# Patient Record
Sex: Male | Born: 1950 | Race: White | Hispanic: No | Smoking: Former smoker
Health system: Southern US, Community
[De-identification: ages and names within clinical notes are randomized; demographics above are authoritative.]

## PROBLEM LIST (undated history)

## (undated) DIAGNOSIS — K219 Gastro-esophageal reflux disease without esophagitis: Secondary | ICD-10-CM

## (undated) DIAGNOSIS — E039 Hypothyroidism, unspecified: Secondary | ICD-10-CM

## (undated) DIAGNOSIS — E079 Disorder of thyroid, unspecified: Secondary | ICD-10-CM

## (undated) DIAGNOSIS — I10 Essential (primary) hypertension: Secondary | ICD-10-CM

## (undated) DIAGNOSIS — T7840XA Allergy, unspecified, initial encounter: Secondary | ICD-10-CM

## (undated) DIAGNOSIS — E119 Type 2 diabetes mellitus without complications: Secondary | ICD-10-CM

## (undated) DIAGNOSIS — G4730 Sleep apnea, unspecified: Secondary | ICD-10-CM

## (undated) DIAGNOSIS — M109 Gout, unspecified: Secondary | ICD-10-CM

## (undated) DIAGNOSIS — H409 Unspecified glaucoma: Secondary | ICD-10-CM

## (undated) DIAGNOSIS — M1990 Unspecified osteoarthritis, unspecified site: Secondary | ICD-10-CM

## (undated) DIAGNOSIS — R112 Nausea with vomiting, unspecified: Secondary | ICD-10-CM

## (undated) DIAGNOSIS — R51 Headache: Secondary | ICD-10-CM

## (undated) DIAGNOSIS — R519 Headache, unspecified: Secondary | ICD-10-CM

## (undated) DIAGNOSIS — K5732 Diverticulitis of large intestine without perforation or abscess without bleeding: Secondary | ICD-10-CM

## (undated) DIAGNOSIS — N189 Chronic kidney disease, unspecified: Secondary | ICD-10-CM

## (undated) HISTORY — DX: Allergy, unspecified, initial encounter: T78.40XA

## (undated) HISTORY — PX: KNEE SURGERY: SHX244

## (undated) HISTORY — DX: Gout, unspecified: M10.9

## (undated) HISTORY — DX: Unspecified glaucoma: H40.9

## (undated) HISTORY — PX: ANKLE FRACTURE SURGERY: SHX122

## (undated) HISTORY — DX: Diverticulitis of large intestine without perforation or abscess without bleeding: K57.32

## (undated) HISTORY — PX: CARPAL TUNNEL RELEASE: SHX101

---

## 2001-11-09 HISTORY — PX: LEG SURGERY: SHX1003

## 2001-12-08 ENCOUNTER — Inpatient Hospital Stay (HOSPITAL_COMMUNITY): Admission: EM | Admit: 2001-12-08 | Discharge: 2001-12-16 | Payer: Self-pay | Admitting: Emergency Medicine

## 2001-12-08 ENCOUNTER — Encounter: Payer: Self-pay | Admitting: Orthopedic Surgery

## 2001-12-08 ENCOUNTER — Encounter: Payer: Self-pay | Admitting: Emergency Medicine

## 2004-12-14 ENCOUNTER — Observation Stay (HOSPITAL_COMMUNITY): Admission: EM | Admit: 2004-12-14 | Discharge: 2004-12-15 | Payer: Self-pay | Admitting: Emergency Medicine

## 2005-01-09 HISTORY — PX: JOINT REPLACEMENT: SHX530

## 2005-01-18 ENCOUNTER — Inpatient Hospital Stay (HOSPITAL_COMMUNITY): Admission: RE | Admit: 2005-01-18 | Discharge: 2005-01-21 | Payer: Self-pay | Admitting: Orthopedic Surgery

## 2008-11-09 HISTORY — PX: JOINT REPLACEMENT: SHX530

## 2010-01-05 ENCOUNTER — Inpatient Hospital Stay (HOSPITAL_COMMUNITY)
Admission: RE | Admit: 2010-01-05 | Discharge: 2010-01-08 | Payer: Self-pay | Source: Home / Self Care | Attending: Orthopaedic Surgery | Admitting: Orthopaedic Surgery

## 2010-02-22 NOTE — Discharge Summary (Signed)
[name], [name]             ACCOUNT NO.:  [id number]  MEDICAL RECORD NO.:  [id number]          PATIENT TYPE:  INP  LOCATION:  5041                         FACILITY:  [facility]  PHYSICIAN:  Stepfon Rawles C. Ophelia Charter, M.D.    DATE OF BIRTH:  January 01, 1951  DATE OF ADMISSION:  01/05/2010 DATE OF DISCHARGE:  01/08/2010                              DISCHARGE SUMMARY   ADMISSION DIAGNOSES: 1. Healed tibial fracture with retained tibial nail and right knee     osteoarthritis. 2. Hypothyroidism.  DISCHARGE DIAGNOSES: 1. Healed tibial fracture with retained tibial nail and right knee     osteoarthritis. 2. Hypothyroidism.  PROCEDURE:  On January 05, 2010, the patient underwent removal of locking tibial nail and cemented total knee arthroplasty of the right knee performed by computer assistance by Dr. Ophelia Charter under general anesthesia, assisted by Wende Neighbors, PAC.  CONSULTATIONS:  None.  BRIEF HISTORY:  The patient is a 60 year old male, who underwent IM nailing of his right tibia previously.  He has developed osteoarthritis of the right knee, which has now failed conservative treatment including intra-articular injections, physical therapy, and activity modification. It was felt he would benefit from surgical intervention and was admitted for the procedure as stated above.  BRIEF HOSPITAL COURSE:  The patient tolerated the procedure under general anesthesia without complications.  Postoperatively, neurovascular motor function of the lower extremities was noted to be intact.  The patient was placed on Coumadin for DVT prophylaxis. Adjustments in his Coumadin dose was made according to daily pro times by the pharmacist.  INR at discharge was 2.40.  The patient was started on physical therapy for ambulation and gait training, range of motion, stretching and strengthening exercises.  He was allowed weightbearing as tolerated on the operative extremity.  CPM was utilized for passive range of  motion.  Pain was controlled initially with IV analgesics, and he was weaned to p.o. analgesics without difficulty.  His wound was healing without drainage.  The patient was ambulating 200 feet prior to discharge.  He was able to void once his Foley catheter was discontinued.  He was taking a regular diet prior to discharge.  Other pertinent laboratory values; CBC on admission with hemoglobin 14.3, hematocrit 41.7.  Postoperatively, hemoglobin 11.3 and hematocrit 33.4.  Chemistry studies on admission were normal and repeat postoperatively were within normal limits.  Urinalysis on admission, negative for urinary tract infection.  The patient had a positive PCR screen for Staphylococcus aureus, negative for methicillin-resistant Staphylococcus aureus.  PLAN:  The patient was discharged to his home.  Arrangements were made for home health physical therapy through [facility].  Also anticoagulation care through [facility].  He will wear his knee immobilizer when he is ambulatory and will have full range of motion exercises.  He will change his dressing daily or as needed.  Continue on a regular diet.  Durable medical equipment made available for his home use.  He will follow up with Dr. Ophelia Charter 2 weeks from the date of surgery.  All questions were encouraged and answered.  Medication reconciliation form was provided for the patient.  He will continue on home medications  as taken prior to admission and was given prescription for oxycodone 5/325 one to two every 4-6 hours as needed for pain, Robaxin 500 mg 1 every 8 hours as needed for spasm, and Coumadin per pharmacy protocol.  CONDITION ON DISCHARGE:  Stable.     Wende Neighbors, P.A.   ______________________________ Veverly Fells Ophelia Charter, M.D.    SMV/MEDQ  D:  02/09/2010  T:  02/10/2010  Job:  [id number]  Electronically Signed by Maud Deed P.A. on 02/10/2010 12:19:25 PM Electronically Signed by Annell Greening M.D. on 02/22/2010 04:54:20 PM

## 2010-03-21 LAB — CBC
Hemoglobin: 11.3 g/dL — ABNORMAL LOW (ref 13.0–17.0)
Hemoglobin: 14.3 g/dL (ref 13.0–17.0)
MCH: 30.4 pg (ref 26.0–34.0)
MCH: 30.7 pg (ref 26.0–34.0)
MCHC: 32.8 g/dL (ref 30.0–36.0)
MCHC: 33.5 g/dL (ref 30.0–36.0)
MCHC: 34.3 g/dL (ref 30.0–36.0)
MCV: 91.6 fL (ref 78.0–100.0)
Platelets: 252 10*3/uL (ref 150–400)
Platelets: 271 10*3/uL (ref 150–400)
Platelets: 282 10*3/uL (ref 150–400)
RBC: 3.91 MIL/uL — ABNORMAL LOW (ref 4.22–5.81)
RBC: 4.58 MIL/uL (ref 4.22–5.81)
RDW: 13 % (ref 11.5–15.5)
WBC: 10.9 10*3/uL — ABNORMAL HIGH (ref 4.0–10.5)

## 2010-03-21 LAB — PROTIME-INR
INR: 0.94 (ref 0.00–1.49)
Prothrombin Time: 12.8 seconds (ref 11.6–15.2)
Prothrombin Time: 17.6 seconds — ABNORMAL HIGH (ref 11.6–15.2)

## 2010-03-21 LAB — COMPREHENSIVE METABOLIC PANEL
ALT: 29 U/L (ref 0–53)
AST: 24 U/L (ref 0–37)
Albumin: 4 g/dL (ref 3.5–5.2)
CO2: 29 mEq/L (ref 19–32)
Calcium: 10.2 mg/dL (ref 8.4–10.5)
Chloride: 104 mEq/L (ref 96–112)
GFR calc Af Amer: >60 mL/min (ref >60)
GFR calc non Af Amer: 52 mL/min — ABNORMAL LOW (ref >60)
Sodium: 139 mEq/L (ref 135–145)

## 2010-03-21 LAB — URINALYSIS, ROUTINE W REFLEX MICROSCOPIC
Bilirubin Urine: NEGATIVE
Hgb urine dipstick: NEGATIVE
Ketones, ur: NEGATIVE mg/dL
Nitrite: NEGATIVE
pH: 6.5 (ref 5.0–8.0)

## 2010-03-21 LAB — BASIC METABOLIC PANEL
CO2: 32 mEq/L (ref 19–32)
Calcium: 8.8 mg/dL (ref 8.4–10.5)
Calcium: 8.9 mg/dL (ref 8.4–10.5)
Creatinine, Ser: 1.12 mg/dL (ref 0.4–1.5)
GFR calc Af Amer: >60 mL/min (ref >60)
GFR calc non Af Amer: 56 mL/min — ABNORMAL LOW (ref >60)
Glucose, Bld: 169 mg/dL — ABNORMAL HIGH (ref 70–99)
Glucose, Bld: 177 mg/dL — ABNORMAL HIGH (ref 70–99)
Potassium: 4.3 mEq/L (ref 3.5–5.1)
Sodium: 137 mEq/L (ref 135–145)

## 2010-03-21 LAB — SURGICAL PCR SCREEN: MRSA, PCR: NEGATIVE

## 2010-05-27 NOTE — Discharge Summary (Signed)
   NAME:  [name], BELLOSO                   ACCOUNT NO.:  [id number]   MEDICAL RECORD NO.:  [id number]                   PATIENT TYPE:  INP   LOCATION:  5013                                 FACILITY:  [facility]   PHYSICIAN:  Nadara Mustard, M.D.                DATE OF BIRTH:  1950/11/20   DATE OF ADMISSION:  12/08/2001  DATE OF DISCHARGE:  12/16/2001                                 DISCHARGE SUMMARY   DIAGNOSES:  1. Right mid shaft tibia fracture.  2. Right trimalleolar ankle fracture.   PROCEDURE:  1. Open reduction internal fixation of the right ankle.  2. IM nail right tibia.   Discharged to home in stable condition with home health physical therapy and  Coumadin for DVT prophylaxis.   HISTORY OF PRESENT ILLNESS:  The patient is a 60 year old gentleman who  states he was cutting down a tree that hit a light pole, swung around and  landed on the patient.  The tree was removed by a Bobcat and the patient was  presented for surgical intervention for the above mentioned injuries.  The  patient's hospital course was essentially unremarkable.  He underwent IM  nailing of the right tibia and ORIF of the right ankle on 12/08/01.  He was  placed on IV Kefzol for infection prophylaxis and p.o. Coumadin for DVT  prophylaxis.  The patient was slow with physical therapy due to the  contusion of  his left knee.  He progressed though slowly with therapy and  was discharged to home in stable condition on 12/8 with home health physical  therapy and followup in the office in two weeks.                                                Nadara Mustard, M.D.    MVD/MEDQ  D:  01/14/2002  T:  01/14/2002  Job:  [id number]

## 2010-05-27 NOTE — Op Note (Signed)
NAME:  [name], COYE                   ACCOUNT NO.:  [id number]   MEDICAL RECORD NO.:  [id number]                   PATIENT TYPE:  INP   LOCATION:  5013                                 FACILITY:  [facility]   PHYSICIAN:  Nadara Mustard, M.D.                DATE OF BIRTH:  09-22-1950   DATE OF PROCEDURE:  12/08/2001  DATE OF DISCHARGE:                                 OPERATIVE REPORT   PREOPERATIVE DIAGNOSES:  1. Right mid shaft tibia fracture.  2. Right trimalleolar ankle fracture.   POSTOPERATIVE DIAGNOSES:  1. Right mid shaft tibia fracture.  2. Right trimalleolar ankle fracture.   OPERATION/PROCEDURE:  1. Intramedullary nailing Synthes nail, 9 x 300 mm, right tibia, locked     proximally.  2. Open reduction/internal fixation, right ankle with a plate lateral with a     lag screw as well as T screws medially.  3. Self-directed mini C-arm was utilized during the case for imaging.   SURGEON:  Nadara Mustard, M.D.   ANESTHESIA:  General.   ESTIMATED BLOOD LOSS:  Minimal, approximately 150 cc.   TOURNIQUET TIME:  None.   ANTIBIOTICS:  One gram of Kefzol.   DISPOSITION:  To PACU in stable condition.   INDICATIONS:  The patient is a 60 year old gentleman who had had a tree  strike his legs and he sustained the above-mentioned injuries.  The patient  was evaluated in the emergency room and found to be stable for surgical  intervention. The risks and benefits were discussed. The patient states he  understands and wishes to proceed at this time.   DESCRIPTION OF PROCEDURE:  The patient was brought to the OR and underwent a  general endotracheal anesthesia.  After adequate level of anesthesia was  obtained, the patient's left lower extremity was prepped using DuraPrep and  draped in a sterile field with a stockinette covering the entire right lower  extremity.   Attention was first focused on the tibia fracture.  A peripatellar incision  was made medially.  A guide  wire was placed into the tibia and this was  overdrilled.  Self-directed C-arm was used for visualization.  A guide wire  was advanced across the fracture site which again was confirmed with self-  directed C-arm and this was reamed to 11 mm for a 10 mm nail.  The nail was  chosen, 300 mm.  This was advanced across the fracture site after switching  the guide wire from a ball tip to a non ball tip guide wire and was locked  proximally x 1.  C-arm fluoroscopy was used to verify AP and lateral  radiographs of the knee and tibia to verify reduction.  The wound was  irrigated with normal saline.  The subcutaneous was closed using 2-0 Vicryl  and the skin was closed using approximated staples.   Attention was then focused on the right ankle.  A bridle incision was made  over fibula and this was carried down to the fracture site.  Subperiosteal  dissection was carried out at the fracture site.  The fracture was reduced.  A lag screw was placed and using the locking one-third tubular plate, three  locking screws were placed proximal and two locking screws distal.  The  wound was irrigated.  The subcutaneous was closed with 2-0 Vicryl and the  skin was closed with approximate staples.  C-arm fluoroscopy again verified  our reduction.   Attention was then focused over the medial malleolus.  A longitudinal  incision was made over the medial malleolus.  This was carried down to the  fascia site.  Subperiosteal dissection was carried down and clamp was used  to hold the fragment reduced.  This was again reduced with a 2 mm K-wire.  A  2.5 mm drill bit was used and a 47 cancellous screw was then used x 2 to  stabilize the medial malleolar fragment.  C-arm fluoroscopy and AP and  lateral planes verified an congruent mortise reduction.  The wound was  irrigated with normal saline.  Subcutaneous was closed with 3-0 Vicryl.  Skin was closed with using approximate staples.  The wounds were covered  with  Adaptic, orthopedic sponges, loosely wrapped Kerlix, a loosely wrapped  Coban and the patient was extubated and taken to the PACU in stable  condition.  Plan for a fracture boot in the recovery room, plan for  nonweightbearing.                                               Nadara Mustard, M.D.    MVD/MEDQ  D:  12/08/2001  T:  12/08/2001  Job:  [id number]

## 2010-05-27 NOTE — Op Note (Signed)
[name], [name] NO.:  [id number]   MEDICAL RECORD NO.:  [id number]            PATIENT TYPE:   LOCATION:                                 FACILITY:   PHYSICIAN:  Nadara Mustard, MD          DATE OF BIRTH:   DATE OF PROCEDURE:  01/18/2005  DATE OF DISCHARGE:                                 OPERATIVE REPORT   PREOP DIAGNOSIS:  Osteoarthritis left knee.   POSTOP DIAGNOSIS:  Osteoarthritis left knee.   PROCEDURE:  Left total knee arthroplasty with DePuy #4 tibia, #4 femur, 38-  mm patella, and 10-mm poly tray.   SURGEON:  Nadara Mustard, MD   ANESTHESIA:  General.   ESTIMATED BLOOD LOSS:  Minimal.   ANTIBIOTICS:  1 gram of Kefzol.   DRAINS:  None.   COMPLICATIONS:  None.   INJECTION:  50 mL of 1/4% Marcaine with epinephrine into the posterior  capsule   TOURNIQUET TIME:  65 minutes at 300 mmHg.   DISPOSITION:  To PACU in stable condition.   INDICATIONS FOR PROCEDURE:  The patient is a 54-year gentleman with severe  osteoarthritis of his left knee. The patient had a 10-degree of varus  deformity with a 10-degree flexion contracture. The patient had bone-on-bone  contact; has failed conservative care; was unable to perform activities of  daily living; and presented, at this time, for total knee arthroplasty. The  risks and benefits were discussed including infection, neurovascular injury,  persistent pain, need for additional surgery. The patient states he  understands and wishes to proceed at this time.   DESCRIPTION OF PROCEDURE:  The patient was brought to OR room #4 and  underwent general anesthetic. After an adequate level of anesthesia was  obtained; the patient's left lower extremity was prepped using DuraPrep and  draped into a sterile field.  An Collier Flowers was used to cover all exposed skin.  The knee was flexed and tourniquet inflated to 300 mmHg. A midline incision  was made. This was carried down to a medial parapatellar retinacular  incision. The patella was everted. Osteophytes were debrided from the  patella and femur. The canal starting hole was made; and the IM rod was  placed and 11-mm was taken off the distal femur. This was sized for a size  4, this was pegged for a size 4 cutting block, and the chamfer cuts for a  size 4 femur were made.   Attention was then focused on the tibia. The external alignment guide was  used for the tibia. This was set to take 0 mm off the most involved medial  joint line and essentially took 10 mm off the lateral joint line. The cut  was made, and the tibial surface was prepared.  This was set to neutral  varus and valgus and neutral posterior slope. All osteophytes were removed.  The posterior osteophytes were removed with an osteotome. The tibial tray  was then sized for a size 4, the template was placed; the keel cuts were  drilled and pegged with the temporary component placed.  The box cut was then  placed with the femur and the box cuts were made for the femur.  The #4  femoral trial was placed and this was tried with a 10, 12, and 15 inserts.  The 10 insert had full extension and had stable varus-and-valgus stress. The  10-insert was chosen. The peg cuts were drilled for the femur. The trial  implants were removed. The patella was resurfaced and 10 mm was taken off  the patella. This was set for a 38 mm and the peg cuts were made for the 38-  mm patella.   The knee was then cleansed with pulse lavage as the cement was mixed on the  back table. The posterior capsule and clod muscles were injected with a  total of 50 mL of 1/4% Marcaine with epinephrine. The tibia and then femoral  components were inserted with the patella tray in place. All loose cement  was removed; and the knee was left in extension while the cement had  hardened. The patella was also placed; and the clamp was placed in the  patella; cement was removed; and this was also left in place until the  cement had  hardened. The remainder of the pulse lavage was used; and the  knee was irrigated with pulse lavage. The tourniquet was deflated and  hemostasis was obtained. The knee was placed through a full range of motion.  The patella tracked well. The knee had full range of motion. The capsule was  then closed using a #1 Vicryl; subcu was closed using 2-0 Vicryl; the skin  was closed using approximated staples. The wound was covered with Adaptic,  orthopedic sponges, sterile Webril, and a Coban dressing. The patient was  extubated, taken to PACU in stable condition.      Nadara Mustard, MD  Electronically Signed     MVD/MEDQ  D:  01/18/2005  T:  01/18/2005  Job:  [id number]

## 2010-05-27 NOTE — H&P (Signed)
NAME:  [name], [name]                   ACCOUNT NO.:  [id number]   MEDICAL RECORD NO.:  [id number]                   PATIENT TYPE:  INP   LOCATION:  5013                                 FACILITY:  [facility]   PHYSICIAN:  Nadara Mustard, M.D.                DATE OF BIRTH:  May 31, 1950   DATE OF ADMISSION:  12/08/2001  DATE OF DISCHARGE:                                HISTORY & PHYSICAL   HISTORY OF PRESENT ILLNESS:  The patient is a 60 year old gentleman who was  chopping down trees when the tree he was cutting down struck a light pole,  twisted, and fell and landed across the patient's legs.  He states that the  people working with him had the bobcat remove the tree from his legs.  The  patient had an unstable fracture of the right leg, had abrasions on both  legs, but no open fractures.  Time of injury approximately 1 p.m.  Consultation in the emergency room approximately 5:30 p.m.   PAST MEDICAL HISTORY:  Essentially unremarkable.  He is status post a left  knee ACL reconstruction by Dr. Kathrene Bongo.   MEDICATIONS:  Cozaar 50 mg q.h.s. for hypertension.   SOCIAL HISTORY:  He smokes three packs per day.  Works for [employer].   REVIEW OF SYSTEMS:  Negative.   PHYSICAL EXAMINATION:  VITAL SIGNS:  Temperature 98.6, heart rate 76,  respiratory rate 20, blood pressure 148/74.  GENERAL:  He is in no acute distress.  LUNGS:  Clear to auscultation.  CARDIOVASCULAR:  Regular rate and rhythm.  NECK:  Supple.  No bruits.  EXTREMITIES:  Examination of the right lower extremity and left lower  extremity show both calves to be soft, nontender.  No edema or swelling.  No  evidence of compartment syndrome.  He has good dorsalis pedis pulses  bilaterally.  Feet are neurovascularly intact.  He does have multiple  abrasions over both lower extremities, but no open fractures.   Radiograph shows a mid-shaft right tibia fracture and a trimalleolar right  ankle fracture.  He does have  degenerative changes in the left ankle and  left knee, but no fractures.   ASSESSMENT:  1. Closed right mid-shaft tibial fracture.  2. Closed right trimalleolar ankle fracture.   PLAN:  The patient was scheduled for surgical intervention at this time.  The risks and benefits were discussed including infection, neurovascular  injury, arthritis, persistent pain, need for additional surgery, DVT,  pulmonary embolus.  The patient states he understands and wishes to proceed  at this time.                                              Nadara Mustard, M.D.   MVD/MEDQ  D:  12/08/2001  T:  12/08/2001  Job:  [id number]

## 2010-05-27 NOTE — Discharge Summary (Signed)
[name], [name]             ACCOUNT NO.:  [id number]   MEDICAL RECORD NO.:  [id number]          PATIENT TYPE:  INP   LOCATION:  5032                         FACILITY:  [facility]   PHYSICIAN:  Nadara Mustard, MD     DATE OF BIRTH:  05-Nov-1950   DATE OF ADMISSION:  01/18/2005  DATE OF DISCHARGE:  01/21/2005                                 DISCHARGE SUMMARY   DIAGNOSIS:  Osteoarthritis, left knee.   PROCEDURE:  Left total knee arthroplasty.   DISPOSITION:  Discharged to home in stable condition with home health  physical therapy and durable medical equipment with plan to follow up in the  office in two weeks.   HISTORY OF PRESENT ILLNESS:  The patient is a 60 year old gentleman with  osteoarthritis of the left knee.  The patient failed conservative care and  presents at this time for total knee arthroplasty.  The patient underwent  total knee arthroplasty with DePuy components on January 18, 2005, with the  #4 tibia, #4 femur, 38 mm patella and 10 mm poly tray.  The patient received  Kefzol for infection prophylaxis and Coumadin for DVT prophylaxis.  The  patient progressed well with physical therapy.  His hemoglobin was stable.  He was discharged to home in stable condition on January 21, 2005, with home  health physical therapy and follow up in the office in two weeks.      Nadara Mustard, MD  Electronically Signed     MVD/MEDQ  D:  03/16/2005  T:  03/16/2005  Job:  [id number]

## 2010-09-15 ENCOUNTER — Inpatient Hospital Stay (HOSPITAL_COMMUNITY)
Admission: EM | Admit: 2010-09-15 | Discharge: 2010-09-20 | DRG: 392 | Disposition: A | Discharge status: Home / Self Care | Payer: 59 | Attending: General Surgery | Admitting: General Surgery

## 2010-09-15 ENCOUNTER — Emergency Department (INDEPENDENT_AMBULATORY_CARE_PROVIDER_SITE_OTHER): Payer: 59

## 2010-09-15 ENCOUNTER — Emergency Department (HOSPITAL_BASED_OUTPATIENT_CLINIC_OR_DEPARTMENT_OTHER)
Admission: EM | Admit: 2010-09-15 | Discharge: 2010-09-15 | Disposition: A | Discharge status: Other Acute Inpatient Hospital | Payer: 59 | Source: Home / Self Care | Attending: Emergency Medicine | Admitting: Emergency Medicine

## 2010-09-15 DIAGNOSIS — K219 Gastro-esophageal reflux disease without esophagitis: Secondary | ICD-10-CM | POA: Insufficient documentation

## 2010-09-15 DIAGNOSIS — K8010 Calculus of gallbladder with chronic cholecystitis without obstruction: Secondary | ICD-10-CM

## 2010-09-15 DIAGNOSIS — M546 Pain in thoracic spine: Secondary | ICD-10-CM

## 2010-09-15 DIAGNOSIS — E039 Hypothyroidism, unspecified: Secondary | ICD-10-CM | POA: Diagnosis present

## 2010-09-15 DIAGNOSIS — K5732 Diverticulitis of large intestine without perforation or abscess without bleeding: Secondary | ICD-10-CM

## 2010-09-15 DIAGNOSIS — I10 Essential (primary) hypertension: Secondary | ICD-10-CM | POA: Insufficient documentation

## 2010-09-15 DIAGNOSIS — E079 Disorder of thyroid, unspecified: Secondary | ICD-10-CM | POA: Insufficient documentation

## 2010-09-15 DIAGNOSIS — Z87891 Personal history of nicotine dependence: Secondary | ICD-10-CM

## 2010-09-15 DIAGNOSIS — K7689 Other specified diseases of liver: Secondary | ICD-10-CM

## 2010-09-15 DIAGNOSIS — K5900 Constipation, unspecified: Secondary | ICD-10-CM

## 2010-09-15 HISTORY — DX: Essential (primary) hypertension: I10

## 2010-09-15 HISTORY — DX: Gastro-esophageal reflux disease without esophagitis: K21.9

## 2010-09-15 HISTORY — DX: Disorder of thyroid, unspecified: E07.9

## 2010-09-15 LAB — COMPREHENSIVE METABOLIC PANEL
Albumin: 3.4 g/dL — ABNORMAL LOW (ref 3.5–5.2)
BUN: 16 mg/dL (ref 6–23)
CO2: 27 mEq/L (ref 19–32)
Chloride: 95 mEq/L — ABNORMAL LOW (ref 96–112)
Creatinine, Ser: 1.3 mg/dL (ref 0.50–1.35)
GFR calc Af Amer: >60 mL/min (ref >60)
GFR calc non Af Amer: 57 mL/min — ABNORMAL LOW (ref >60)
Glucose, Bld: 105 mg/dL — ABNORMAL HIGH (ref 70–99)
Total Bilirubin: 0.4 mg/dL (ref 0.3–1.2)

## 2010-09-15 LAB — URINALYSIS, ROUTINE W REFLEX MICROSCOPIC
Leukocytes, UA: NEGATIVE
Nitrite: NEGATIVE
Protein, ur: 30 mg/dL — AB
Urobilinogen, UA: 1 mg/dL (ref 0.0–1.0)

## 2010-09-15 LAB — URINE MICROSCOPIC-ADD ON

## 2010-09-15 LAB — CBC
HCT: 39.7 % (ref 39.0–52.0)
MCV: 86.9 fL (ref 78.0–100.0)
RDW: 12.6 % (ref 11.5–15.5)
WBC: 12.8 10*3/uL — ABNORMAL HIGH (ref 4.0–10.5)

## 2010-09-15 LAB — LIPASE, BLOOD: Lipase: 18 U/L (ref 11–59)

## 2010-09-15 MED ORDER — ONDANSETRON HCL 4 MG/2ML IJ SOLN
INTRAMUSCULAR | Status: AC
  Filled 2010-09-15: qty 2

## 2010-09-15 MED ORDER — HYDROMORPHONE HCL 1 MG/ML IJ SOLN
1.0000 mg | Freq: Once | INTRAMUSCULAR | Status: AC
  Administered 2010-09-15: 1 mg via INTRAVENOUS

## 2010-09-15 MED ORDER — HYDROMORPHONE HCL 1 MG/ML IJ SOLN
INTRAMUSCULAR | Status: AC
  Filled 2010-09-15: qty 1

## 2010-09-15 MED ORDER — ONDANSETRON HCL 4 MG/2ML IJ SOLN
4.0000 mg | Freq: Once | INTRAMUSCULAR | Status: AC
  Administered 2010-09-15: 4 mg via INTRAVENOUS

## 2010-09-15 MED ORDER — SODIUM CHLORIDE 0.9 % IV BOLUS (SEPSIS)
1000.0000 mL | Freq: Once | INTRAVENOUS | Status: DC
  Administered 2010-09-15: 1000 mL via INTRAVENOUS

## 2010-09-15 MED ORDER — CIPROFLOXACIN IN D5W 400 MG/200ML IV SOLN
400.0000 mg | Freq: Once | INTRAVENOUS | Status: DC
  Administered 2010-09-15: 400 mg via INTRAVENOUS
  Filled 2010-09-15: qty 200

## 2010-09-15 MED ORDER — IOHEXOL 300 MG/ML  SOLN
100.0000 mL | Freq: Once | INTRAMUSCULAR | Status: AC | PRN
  Administered 2010-09-15: 100 mL via INTRAVENOUS

## 2010-09-15 MED ORDER — METRONIDAZOLE IN NACL 5-0.79 MG/ML-% IV SOLN
500.0000 mg | Freq: Once | INTRAVENOUS | Status: DC
  Filled 2010-09-15: qty 100

## 2010-09-15 NOTE — ED Provider Notes (Signed)
History     CSN: [id number] Arrival date & time: 09/15/2010  6:00 PM  Chief Complaint  Patient presents with  . Abdominal Pain   Patient is a 60 y.o. male presenting with abdominal pain.  Abdominal Pain The primary symptoms of the illness include abdominal pain. The primary symptoms of the illness do not include dysuria. The current episode started more than 2 days ago. The onset of the illness was gradual. The problem has been gradually worsening.  The illness is associated with recent antibiotic use. Additional symptoms associated with the illness include hematuria. Symptoms associated with the illness do not include chills, anorexia, diaphoresis, heartburn, constipation, urgency, frequency or back pain. Significant associated medical issues include GERD. Significant associated medical issues do not include PUD, inflammatory bowel disease, diabetes, sickle cell disease, gallstones, liver disease, substance abuse, diverticulitis, HIV or cardiac disease.    Past Medical History  Diagnosis Date  . Hypertension   . GERD (gastroesophageal reflux disease)   . Thyroid disease     Past Surgical History  Procedure Date  . Knee surgery   . Leg surgery     No family history on file.  History  Substance Use Topics  . Smoking status: Former Games developer  . Smokeless tobacco: Not on file  . Alcohol Use: Yes      Review of Systems  Constitutional: Negative for chills and diaphoresis.  HENT: Negative for facial swelling.   Eyes: Negative for discharge.  Respiratory: Negative for apnea.   Cardiovascular: Negative for chest pain.  Gastrointestinal: Positive for abdominal pain. Negative for heartburn, constipation, abdominal distention and anorexia.  Genitourinary: Positive for hematuria. Negative for dysuria, urgency, frequency, flank pain, discharge, difficulty urinating and genital sores.  Musculoskeletal: Negative for back pain.  Skin: Negative.   Neurological: Negative.   Hematological:  Negative.   Psychiatric/Behavioral: Negative.     Physical Exam  BP 110/74  Pulse 83  Temp(Src) 99.4 F (37.4 C) (Oral)  Resp 20  Ht 6' (1.829 m)  Wt 216 lb (97.977 kg)  BMI 29.29 kg/m2  SpO2 98%  Physical Exam  Constitutional: He is oriented to person, place, and time. He appears well-developed.  HENT:  Head: Normocephalic and atraumatic.  Eyes: EOM are normal. Pupils are equal, round, and reactive to light.  Neck: Normal range of motion. Neck supple.  Cardiovascular: Normal rate and regular rhythm.   Pulmonary/Chest: Effort normal and breath sounds normal.  Abdominal: Soft. Bowel sounds are normal. He exhibits no distension and no mass. There is tenderness.  Musculoskeletal: Normal range of motion.  Neurological: He is alert and oriented to person, place, and time.  Skin: Skin is warm and dry.  Psychiatric: He has a normal mood and affect.    ED Course  Procedures     Case d/w Dr. Silvano Rusk who will see in the ED  Kieley Akter K Colonel Krauser-Rasch, MD 09/15/10 [id number]

## 2010-09-15 NOTE — ED Notes (Signed)
abd pain x 2 days-ws seen by PCP-dx with UTI given rx-states is no better-pain abd radiates to back

## 2010-09-16 LAB — BASIC METABOLIC PANEL
BUN: 16 mg/dL (ref 6–23)
CO2: 25 mEq/L (ref 19–32)
Glucose, Bld: 180 mg/dL — ABNORMAL HIGH (ref 70–99)
Potassium: 4.3 mEq/L (ref 3.5–5.1)
Sodium: 130 mEq/L — ABNORMAL LOW (ref 135–145)

## 2010-09-16 LAB — CBC
HCT: 36 % — ABNORMAL LOW (ref 39.0–52.0)
MCH: 30.2 pg (ref 26.0–34.0)
MCV: 88.5 fL (ref 78.0–100.0)
RDW: 13 % (ref 11.5–15.5)
WBC: 8.3 10*3/uL (ref 4.0–10.5)

## 2010-09-16 LAB — URINE CULTURE

## 2010-09-20 LAB — COMPREHENSIVE METABOLIC PANEL
BUN: 12 mg/dL (ref 6–23)
CO2: 27 mEq/L (ref 19–32)
Calcium: 9.7 mg/dL (ref 8.4–10.5)
Creatinine, Ser: 1.13 mg/dL (ref 0.50–1.35)
GFR calc Af Amer: >60 mL/min (ref >60)
GFR calc non Af Amer: >60 mL/min (ref >60)
Glucose, Bld: 114 mg/dL — ABNORMAL HIGH (ref 70–99)

## 2010-09-20 LAB — CBC
Hemoglobin: 12.6 g/dL — ABNORMAL LOW (ref 13.0–17.0)
MCH: 30.1 pg (ref 26.0–34.0)
MCHC: 34.3 g/dL (ref 30.0–36.0)
MCV: 87.6 fL (ref 78.0–100.0)
RBC: 4.19 MIL/uL — ABNORMAL LOW (ref 4.22–5.81)

## 2010-09-20 NOTE — H&P (Signed)
[name], [name] NO.:  [id number]  MEDICAL RECORD NO.:  [id number]  LOCATION:                                 FACILITY:  PHYSICIAN:  Velora Heckler, MD      DATE OF BIRTH:  01-02-1951  DATE OF ADMISSION: DATE OF DISCHARGE:                             HISTORY & PHYSICAL   REFERRING PHYSICIAN:  April Palumbo-Rasch, MD, [facility] Department.  REASON FOR ADMISSION:  Acute diverticulitis with microperforation.  BRIEF HISTORY:  The patient is a 60 year old white male from [place], [place].  He presented to [facility] on September 6th with 4-day history of lower abdominal pain and fever.  The patient had been evaluated 2 days previous by his primary care physician.  He was diagnosed at that time with suspected urinary tract infection and placed on oral antibiotics.  The patient continued to have lower abdominal pain associated with anorexia.  He presented to [facility] for reevaluation.  The patient was noted to have an elevated white blood cell count of 12.8.  Physical exam was notable for left lower quadrant abdominal tenderness.  CT scan of abdomen and pelvis showed evidence of acute diverticulitis with microperforation.  General Surgery was contacted and the patient was transported from [facility] to [facility] Emergency Department for general surgery evaluation and admission.  PAST MEDICAL HISTORY: 1. Hypertension. 2. Gastroesophageal reflux disease. 3. Hypothyroidism. 4. Bilateral knee surgery.  MEDICATIONS:  Losartan, Protonix, and levothyroxine  ALLERGIES:  No known drug allergies.  SOCIAL HISTORY:  The patient is accompanied by his 2 daughters.  He is employed as a Curator for [employer].  He quit smoking 1 year ago.  He drinks alcohol on occasion.  FAMILY HISTORY:  Noncontributory.  REVIEW OF SYSTEMS:  15-system review without significant other findings except as noted  above.  PHYSICAL EXAMINATION:  GENERAL:  A 60 year old, well-developed, well- nourished white male on a stretcher in the emergency department, no significant discomfort. VITAL SIGNS:  Temperature 98.2, pulse 73, respirations 18, blood pressure 105/65. HEENT:  Normocephalic, atraumatic.  Sclerae clear.  Conjunctivae clear. Pupils equal and reactive.  Dentition fair.  Mucous membranes moist. Voice normal. NECK:  Palpation of the neck shows no thyroid nodularity.  No lymphadenopathy.  No mass.  No tenderness.  LUNGS:  Clear to auscultation bilaterally without rales, rhonchi, or wheeze. CARDIAC:  Regular rate and rhythm without significant murmur. Peripheral pulses are full. EXTREMITIES:  Nontender without edema. NEUROLOGIC:  The patient is patient is alert and oriented without focal neurologic deficit. ABDOMEN:  Shows mild distention.  On auscultation, there are no bowel sounds present.  There is mild-to-moderate tenderness to palpation and percussion in the suprapubic and left lower quadrants.  There are no surgical wounds.  There is no obvious sign of hernia.  LABORATORY STUDIES:  White count 12.8, hemoglobin 14.0, platelet count 313,000.  Electrolytes show potassium 3.8, creatinine 1.30, calcium was elevated at 11.0.  Liver function tests are normal.  RADIOGRAPHIC STUDIES:  CT scan of the abdomen and pelvis shows evidence of acute diverticulitis.  There are a few small loculated collections of air suspicious for microperforation.  There is no significant fluid. There is no gross free air present within the peritoneal cavity.  IMPRESSION:  Perforated sigmoid diverticulitis.  PLAN: 1. The patient will be admitted on the general surgery service at     [facility]. 2. The patient has been started on intravenous Cipro and Flagyl by the     emergency department.  This will be continued. 3. The patient will be maintained n.p.o. and given intravenous     hydration.   He will be allowed ice chips for comfort. 4. Repeat laboratories on September 7th to include CBC, basic     metabolic profile, and prothrombin time. 5. I discussed with the patient and his daughters the significance of     acute diverticulitis with microperforation.  We discussed urgent     surgical intervention with sigmoid colectomy and descending     colostomy as a possibility.  Certainly, if the patient fails bowel     rest and intravenous antibiotics, he will require urgent     laparotomy.  Otherwise, the patient may be a candidate for delayed     sigmoid colectomy, which can be performed as a single-stage     procedure.  I told him there is approximately a 50% chance of     success with medical management at this point.  He understands and     wishes to proceed.     Velora Heckler, MD     TMG/MEDQ  D:  09/16/2010  T:  09/16/2010  Job:  [id number]  cc:   Joycelyn Rua, M.D. Fax: [phone]  Electronically Signed by Darnell Level MD on 09/20/2010 09:03:35 AM

## 2010-09-29 NOTE — Discharge Summary (Signed)
  NAMERONELL, DUFFUS NO.:  [id number]  MEDICAL RECORD NO.:  [id number]  LOCATION:  1304                         FACILITY:  [facility]  PHYSICIAN:  Maisie Fus A. Adolphus Hanf, M.D.DATE OF BIRTH:  [birth year]  DATE OF ADMISSION:  09/15/2010 DATE OF DISCHARGE:  09/20/2010                              DISCHARGE SUMMARY   ADMISSION DIAGNOSES: 1. Acute diverticulitis with microperforation. 2. Hypertension. 3. Gastroesophageal reflux disease. 4. Hypothyroidism. 5. Bilateral knee surgery.  DISCHARGE DIAGNOSES: 1. Acute diverticulitis with microperforation. 2. Hypertension. 3. Gastroesophageal reflux disease. 4. Hypothyroidism. 5. Bilateral knee surgery.  PROCEDURES:  None.  BRIEF HISTORY:  The patient is a 60 year old white male from [facility], who [facility] with 4-day history of lower abdominal pain and fever.  He had been evaluated 2 days prior by his primary care.  His diagnosis at that time was suspected urinary tract infection, was placed on oral antibiotics.  He continued to have abdominal pain associated with anorexia.  He was seen in the ER.  His white count was 12,800.  Exam was notable for left lower quadrant abdominal tenderness.  CT showed evidence of acute diverticulitis with perforation.  He was subsequently transported to [facility] and admitted by Dr. Darnell Level for the General Surgery Service.  MEDICATIONS ON ADMISSION:  Losartan, Protonix, and levothyroxine.  ALLERGIES:  None known.  For further history and physical, please see the dictated note.  HOSPITAL COURSE:  The patient was admitted.  He was started on IV Flagyl and Cipro.  This was continued along with IV hydration.  On September 16, 2010, he noted improvement in his symptoms.  White count was down to 8.3.  He continued to make slow steady progress.  On September 19, 2010, he had a bowel movement.  His diet was increased to a soft.  He was restarted  on his p.o. medications from home, and by September 20, 2010, he was feeling much better.  Discomfort was resolved.  His white count is still slightly elevated at 11,600, hemoglobin is 12.6, hematocrit is 36.7, platelets are 358,000.  Electrolytes are normal.  BUN is 12, creatinine is 1.13.  LFTs were normal.  At that point, it was Dr. Rosezena Sensor opinion is that he could go home on 2 weeks of antibiotics. He will follow up with Dr. Gerrit Friends in 2 weeks.  He is to call for an appointment, and he is to call [facility] for followup in their office also.  CONDITION ON DISCHARGE:  Improving.     Eber Hong, P.A.   ______________________________ Clovis Pu Dannelle Rhymes, M.D.    WDJ/MEDQ  D:  09/20/2010  T:  09/20/2010  Job:  [id number]  cc:   [facility]  Electronically Signed by Sherrie George P.A. on 09/26/2010 04:12:44 PM Electronically Signed by Harriette Bouillon M.D. on 09/29/2010 02:28:33 AM

## 2010-10-20 ENCOUNTER — Ambulatory Visit (INDEPENDENT_AMBULATORY_CARE_PROVIDER_SITE_OTHER): Payer: 59 | Admitting: Surgery

## 2010-10-20 ENCOUNTER — Encounter (INDEPENDENT_AMBULATORY_CARE_PROVIDER_SITE_OTHER): Payer: Self-pay | Admitting: Surgery

## 2010-10-20 VITALS — BP 102/70 | HR 88 | Temp 97.6°F | Resp 20 | Ht 72.0 in | Wt 213.1 lb

## 2010-10-20 DIAGNOSIS — K5720 Diverticulitis of large intestine with perforation and abscess without bleeding: Secondary | ICD-10-CM | POA: Insufficient documentation

## 2010-10-20 DIAGNOSIS — K5732 Diverticulitis of large intestine without perforation or abscess without bleeding: Secondary | ICD-10-CM

## 2010-10-20 NOTE — Progress Notes (Signed)
Visit Diagnoses: 1. Diverticulitis of colon with perforation     HISTORY: Patient is a 60 year old male who is admitted in September 2012 to the general surgery service with acute diverticulitis with microperforation. Patient was treated with bowel rest and antibiotic therapy with improvement. He did not require laparotomy. He was discharged home on 2 week course of oral antibiotics. He has now been off of his antibiotic therapy for over 2 weeks and is doing well.  Patient has no further abdominal pain. He is watching his diet closely. He returns today for scheduled followup.   PERTINENT REVIEW OF SYSTEMS: Regular bowel movements. Patient denies bleeding per rectum. He denies abdominal pain. He denies fever.   EXAM: HEENT: normocephalic; pupils equal and reactive; sclerae clear; dentition good; mucous membranes moist NECK:  No nodules; symmetric on extension; no palpable anterior or posterior cervical lymphadenopathy; no supraclavicular masses; no tenderness CHEST: clear to auscultation bilaterally without rales, rhonchi, or wheezes CARDIAC: regular rate and rhythm without significant murmur; peripheral pulses are full ABDOMEN: Abdomen is soft, nontender, without distention. There is a small reducible umbilical hernia which is asymptomatic. No palpable masses. No hepatosplenomegaly. EXT:  non-tender without edema; no deformity NEURO: no gross focal deficits; no sign of tremor   IMPRESSION: #1 recent acute diverticulitis with microperforation, resolved on antibiotic therapy #2 asymptomatic umbilical hernia #3 need for screening colonoscopy   PLAN: Patient will undergo a barium enema study to define the extent and location of his diverticular disease. At the patient's request, we will delay this study until January 2013. He will return to see me when that study is completed to review the results and to discuss whether surgical intervention is necessary.  Once this study is completed, I  will ask his primary physician to arrange for screening colonoscopy.   Velora Heckler, MD, FACS General & Endocrine Surgery [facility]  Primary:  Joycelyn Rua, MD, [facility]

## 2010-10-20 NOTE — Patient Instructions (Signed)
Barium enema in January 2013 per patient request.  TMG

## 2010-11-07 ENCOUNTER — Other Ambulatory Visit (HOSPITAL_COMMUNITY): Payer: 59

## 2011-01-25 ENCOUNTER — Ambulatory Visit (HOSPITAL_COMMUNITY)
Admission: RE | Admit: 2011-01-25 | Discharge: 2011-01-25 | Disposition: A | Discharge status: Home / Self Care | Payer: 59 | Source: Ambulatory Visit | Attending: Surgery | Admitting: Surgery

## 2011-01-25 DIAGNOSIS — K5720 Diverticulitis of large intestine with perforation and abscess without bleeding: Secondary | ICD-10-CM

## 2011-01-25 DIAGNOSIS — K5730 Diverticulosis of large intestine without perforation or abscess without bleeding: Secondary | ICD-10-CM | POA: Insufficient documentation

## 2011-01-25 MED ORDER — IOHEXOL 300 MG/ML  SOLN: 450.0000 mL | Freq: Once | INTRAMUSCULAR | Status: AC | PRN

## 2011-02-02 ENCOUNTER — Encounter (INDEPENDENT_AMBULATORY_CARE_PROVIDER_SITE_OTHER): Payer: Self-pay | Admitting: Surgery

## 2011-02-02 ENCOUNTER — Ambulatory Visit (INDEPENDENT_AMBULATORY_CARE_PROVIDER_SITE_OTHER): Payer: 59 | Admitting: Surgery

## 2011-02-02 VITALS — BP 122/68 | HR 66 | Temp 97.8°F | Resp 16 | Ht 72.0 in | Wt 216.0 lb

## 2011-02-02 DIAGNOSIS — K5732 Diverticulitis of large intestine without perforation or abscess without bleeding: Secondary | ICD-10-CM

## 2011-02-02 DIAGNOSIS — K5720 Diverticulitis of large intestine with perforation and abscess without bleeding: Secondary | ICD-10-CM

## 2011-02-02 NOTE — Progress Notes (Signed)
Visit Diagnoses: 1. Diverticulitis of colon with perforation     HISTORY: Patient is a 61 year old white male who was hospitalized with acute diverticulitis with microperforation. He was managed with bowel rest and intravenous antibiotics and resolved without surgical intervention. At my request he underwent a barium enema study. This demonstrates a short segment of diverticulosis measuring 6 cm in length in the sigmoid colon with mild narrowing consistent with a stricture or muscle spasm.  No other pathologic abnormalities are identified. No sign of leakage.  PERTINENT REVIEW OF SYSTEMS: Patient is taking a high fiber diet. He is having 1-2 soft bowel movements daily. He denies abdominal pain. He denies any bleeding per rectum.  EXAM: HEENT: normocephalic; pupils equal and reactive; sclerae clear; dentition good; mucous membranes moist ABDOMEN: Soft without distension; no tenderness; no mass; small umbilical hernia, reducible  IMPRESSION: Personal history of acute diverticulitis with microperforation, resolved  PLAN: The patient and I reviewed the barium enema study results. At this point I do not recommend surgical intervention. Patient will remain on his diet. I will ask his primary care physician to arrange for screening colonoscopy at some point in the next year.  Patient I discussed warning signs of recurrent diverticulitis. He will return to see me should he develop any further problems related to his diverticular disease.  Velora Heckler, MD, FACS General & Endocrine Surgery [facility]

## 2011-02-02 NOTE — Patient Instructions (Signed)
Diverticulitis A diverticulum is a small pouch or sac on the colon. Diverticulosis is the presence of these diverticula on the colon. Diverticulitis is the irritation (inflammation) or infection of diverticula. CAUSES  The colon and its diverticula contain bacteria. If food particles block the tiny opening to a diverticulum, the bacteria inside can grow and cause an increase in pressure. This leads to infection and inflammation and is called diverticulitis. SYMPTOMS   Abdominal pain and tenderness. Usually, the pain is located on the left side of your abdomen. However, it could be located elsewhere.   Fever.   Bloating.   Feeling sick to your stomach (nausea).   Throwing up (vomiting).   Abnormal stools.  DIAGNOSIS  Your caregiver will take a history and perform a physical exam. Since many things can cause abdominal pain, other tests may be necessary. Tests may include:  Blood tests.   Urine tests.   X-ray of the abdomen.   CT scan of the abdomen.  Sometimes, surgery is needed to determine if diverticulitis or other conditions are causing your symptoms. TREATMENT  Most of the time, you can be treated without surgery. Treatment includes:  Resting the bowels by only having liquids for a few days. As you improve, you will need to eat a low-fiber diet.   Intravenous (IV) fluids if you are losing body fluids (dehydrated).   Antibiotic medicines that treat infections may be given.   Pain and nausea medicine, if needed.   Surgery if the inflamed diverticulum has burst.  HOME CARE INSTRUCTIONS   Try a clear liquid diet (broth, tea, or water for as long as directed by your caregiver). You may then gradually begin a low-fiber diet as tolerated. A low-fiber diet is a diet with less than 10 grams of fiber. Choose the foods below to reduce fiber in the diet:   White breads, cereals, rice, and pasta.   Cooked fruits and vegetables or soft fresh fruits and vegetables without the skin.     Ground or well-cooked tender beef, ham, veal, lamb, pork, or poultry.   Eggs and seafood.   After your diverticulitis symptoms have improved, your caregiver may put you on a high-fiber diet. A high-fiber diet includes 14 grams of fiber for every 1000 calories consumed. For a standard 2000 calorie diet, you would need 28 grams of fiber. Follow these diet guidelines to help you increase the fiber in your diet. It is important to slowly increase the amount fiber in your diet to avoid gas, constipation, and bloating.   Choose whole-grain breads, cereals, pasta, and brown rice.   Choose fresh fruits and vegetables with the skin on. Do not overcook vegetables because the more vegetables are cooked, the more fiber is lost.   Choose more nuts, seeds, legumes, dried peas, beans, and lentils.   Look for food products that have greater than 3 grams of fiber per serving on the Nutrition Facts label.   Take all medicine as directed by your caregiver.   If your caregiver has given you a follow-up appointment, it is very important that you go. Not going could result in lasting (chronic) or permanent injury, pain, and disability. If there is any problem keeping the appointment, call to reschedule.  SEEK MEDICAL CARE IF:   Your pain does not improve.   You have a hard time advancing your diet beyond clear liquids.   Your bowel movements do not return to normal.  SEEK IMMEDIATE MEDICAL CARE IF:   Your pain becomes   worse.   You have an oral temperature above 102 F (38.9 C), not controlled by medicine.   You have repeated vomiting.   You have bloody or black, tarry stools.   Symptoms that brought you to your caregiver become worse or are not getting better.  MAKE SURE YOU:   Understand these instructions.   Will watch your condition.   Will get help right away if you are not doing well or get worse.  Document Released: 10/05/2004 Document Revised: 09/07/2010 Document Reviewed:  01/31/2010 Exit[employer] Patient Information 2012 [employer]. 

## 2011-09-19 ENCOUNTER — Encounter (INDEPENDENT_AMBULATORY_CARE_PROVIDER_SITE_OTHER): Payer: Self-pay

## 2015-12-01 ENCOUNTER — Ambulatory Visit (INDEPENDENT_AMBULATORY_CARE_PROVIDER_SITE_OTHER): Payer: 59

## 2015-12-01 ENCOUNTER — Ambulatory Visit (INDEPENDENT_AMBULATORY_CARE_PROVIDER_SITE_OTHER): Payer: 59 | Admitting: Orthopaedic Surgery

## 2015-12-01 ENCOUNTER — Encounter (INDEPENDENT_AMBULATORY_CARE_PROVIDER_SITE_OTHER): Payer: Self-pay | Admitting: Orthopaedic Surgery

## 2015-12-01 ENCOUNTER — Encounter (INDEPENDENT_AMBULATORY_CARE_PROVIDER_SITE_OTHER): Payer: Self-pay

## 2015-12-01 VITALS — BP 124/75 | HR 71 | Ht 72.0 in | Wt 198.0 lb

## 2015-12-01 DIAGNOSIS — M542 Cervicalgia: Secondary | ICD-10-CM

## 2015-12-01 DIAGNOSIS — M4722 Other spondylosis with radiculopathy, cervical region: Secondary | ICD-10-CM | POA: Insufficient documentation

## 2015-12-01 NOTE — Progress Notes (Signed)
Office Visit Note   Patient: [name]           Date of Birth: 1950/04/15           MRN: [id number] Visit Date: 12/01/2015              Requested by: Corine Shelter, PA-C [address] [place], [place] [address] PCP: Corine Shelter, PA-C   Assessment & Plan: Visit Diagnoses:  1. Neck pain   2. Osteoarthritis of spine with radiculopathy, cervical region     Plan: Patient will proceed with cervical MRI to evaluate him for his left C7 radiculopathy. He also has some C8 weakness.  Follow-Up Instructions: Return in about 2 weeks (around 12/15/2015).   Orders:  Orders Placed This Encounter  Procedures  . XR Cervical Spine 2 or 3 views  . MR Cervical Spine w/o contrast   No orders of the defined types were placed in this encounter.     Procedures: No procedures performed   Clinical Data: No additional findings.   Subjective: Chief Complaint  Patient presents with  . Left Hand - Numbness    Patient here for numbness in left hand. This has been ongoing x 2 months. He states that the 4th and 5th digits are numb and that it radiates up into his wrist. He does have left neck pain. He had carpal tunnel release on left and right in 2016.  He states that he had xrays made that showed disc changes in his neck.  He is not taking anything for discomfort.  Patient got good relief of his carpal tunnel symptoms first 2016 surgery. Last several months had progressive weakness in his arm some pain in his neck loss of grip strength weakness with pushing on the left none on the right no problems with the walking no leg weakness  Review of Systems  Constitutional: Negative for chills and diaphoresis.  HENT: Negative for ear discharge, ear pain and nosebleeds.   Eyes: Negative for discharge and visual disturbance.  Respiratory: Negative for cough, choking and shortness of breath.   Cardiovascular: Negative for chest pain and palpitations.  Gastrointestinal: Negative for  abdominal distention and abdominal pain.  Endocrine: Negative for cold intolerance and heat intolerance.  Genitourinary: Negative for flank pain and hematuria.  Skin: Negative for rash and wound.  Neurological: Negative for seizures and speech difficulty.  Hematological: Negative for adenopathy. Does not bruise/bleed easily.  Psychiatric/Behavioral: Negative for agitation and suicidal ideas.     Objective: Vital Signs: BP 124/75   Pulse 71   Ht 6' (1.829 m)   Wt 198 lb (89.8 kg)   BMI 26.85 kg/m   Physical Exam  Constitutional: He is oriented to person, place, and time. He appears well-developed and well-nourished.  HENT:  Head: Normocephalic and atraumatic.  Eyes: EOM are normal. Pupils are equal, round, and reactive to light.  Neck: No tracheal deviation present. No thyromegaly present.  Cardiovascular: Normal rate.   Pulmonary/Chest: Effort normal. He has no wheezes.  Abdominal: Soft. Bowel sounds are normal.  Musculoskeletal:  Patient has a positive Spurling brachial plexus tenderness on the left. Stress ataxia on the left. Weakness of the FCU interossei weakness without interossei atrophy  Neurological: He is alert and oriented to person, place, and time.  Skin: Skin is warm and dry. Capillary refill takes less than 2 seconds.  Psychiatric: He has a normal mood and affect. His behavior is normal. Judgment and thought content normal.  Ortho Exam this or lymphadenopathy. No pain with cervical compression. Biceps is strong. Has some decreased strength in grip decreased sensation ulnar one half fingers Tinel's over the ulnar nerve at the cubital fossa. Weakness with the resisted wrist flexion extension wrist weakness. EPL EDC are normal. Interossei weakness is noted left hand only. Right left carpal tunnel incisions are well-healed. Negative Phalen's negative carpal compression test.  Specialty Comments:  No specialty comments available.  Imaging: No results  found.   PMFS History: Patient Active Problem List   Diagnosis Date Noted  . Neck pain 12/01/2015  . Osteoarthritis of spine with radiculopathy, cervical region 12/01/2015  . Diverticulitis of colon with perforation 10/20/2010   Past Medical History:  Diagnosis Date  . Diverticulitis of colon   . GERD (gastroesophageal reflux disease)   . Hypertension   . Thyroid disease     History reviewed. No pertinent family history.  Past Surgical History:  Procedure Laterality Date  . ANKLE FRACTURE SURGERY    . CARPAL TUNNEL RELEASE Bilateral   . 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 JOINT REPLACEMENT  11/10   right knee replaced   . KNEE SURGERY    . LEG SURGERY  11/03   broken lower right leg crushed    Social History   Occupational History  . Not on file.   Social History Main Topics  . Smoking status: Former Smoker    Types: Cigarettes  . Smokeless tobacco: Former Systems developer    Types: Chew  . Alcohol use Yes  . Drug use: No  . Sexual activity: Not on file

## 2015-12-18 ENCOUNTER — Ambulatory Visit
Admission: RE | Admit: 2015-12-18 | Discharge: 2015-12-18 | Disposition: A | Discharge status: Home / Self Care | Payer: Self-pay | Source: Ambulatory Visit | Attending: Orthopaedic Surgery | Admitting: Orthopaedic Surgery

## 2015-12-18 DIAGNOSIS — M4722 Other spondylosis with radiculopathy, cervical region: Secondary | ICD-10-CM

## 2015-12-24 ENCOUNTER — Ambulatory Visit (INDEPENDENT_AMBULATORY_CARE_PROVIDER_SITE_OTHER): Payer: 59 | Admitting: Orthopaedic Surgery

## 2015-12-24 DIAGNOSIS — M4722 Other spondylosis with radiculopathy, cervical region: Secondary | ICD-10-CM

## 2015-12-24 NOTE — Progress Notes (Signed)
Office Visit Note   Patient: [name]           Date of Birth: August 26, 1950           MRN: [id number] Visit Date: 12/24/2015              Requested by: Corine Shelter, PA-C [place], [place] [address] PCP: Corine Shelter, PA-C   Assessment & Plan: Visit Diagnoses:  1. Osteoarthritis of spine with radiculopathy, cervical region     Plan: Patient is a diabetic on metformin we will avoid  a prednisone Dosepak at this time since it may give him some hyperglycemia. Will set him up for some physical therapy and recheck him again in one month. I gave him a copy of his MRI report we reviewed the multiple level involvement with disc bulge and severe bilateral neural foraminal narrowing at C5-6 as well as disc bulge at C6-7. He has some disc bulges C4-5 but it's on the right side not on the symptomatic left side. There is some anterolisthesis of C6-7 and also T1-2 but that is not consistent with his symptoms. There is no foraminal narrowing at C7-T1. If his weakness persists and he does not respond to physical therapy then operative intervention may be necessary for his arm weakness which is symptomatic and has progressed over the last 2 months.  Follow-Up Instructions: Return in about 1 month (around 01/24/2016).   Orders:  No orders of the defined types were placed in this encounter.  No orders of the defined types were placed in this encounter.   HPI Patient returns with ongoing pain in his neck and left hand numbness and weakness with weakness of gripping numbness in the ulnar side of his left hand. He denies any right hand symptoms. His progress over the last 3 months. Sometimes it wakes him up at night. He's had previous carpal tunnel surgery 2016 and at that time there is no evidence of radiculopathy and no evidence of ulnar nerve compression at the elbow. MRI has been performed on 12/18/2015 and he returns for review of diagnostic imaging.   Procedures: No procedures  performed   Clinical Data: No additional findings.   Subjective: No chief complaint on file.   HPI see above.   Review of Systems unchanged from 12/01/2015 visit with 14 point review of systems updated.   Objective: Vital Signs: There were no vitals taken for this visit.  Physical Exam  Constitutional: He is oriented to person, place, and time. He appears well-developed and well-nourished.  HENT:  Head: Normocephalic and atraumatic.  Eyes: EOM are normal. Pupils are equal, round, and reactive to light.  Neck: No tracheal deviation present. No thyromegaly present.  Cardiovascular: Normal rate.   Pulmonary/Chest: Effort normal. He has no wheezes.  Abdominal: Soft. Bowel sounds are normal.  Musculoskeletal:  Well-healed right and left total knee arthroplasty incisions. No pain with hip range of motion. Distal pulses are 2+ pulses at the wrist are normal. Negative Phalen's over the carpal tunnel right and left. He has mild discomfort with percussion over the ulnar nerve cubital tunnel.  Neurological: He is alert and oriented to person, place, and time.  Skin: Skin is warm and dry. Capillary refill takes less than 2 seconds.  Psychiatric: He has a normal mood and affect. His behavior is normal. Judgment and thought content normal.    Ortho Exam patient has positive Spurling's on the left pain with brachial plexus pressure. Weakness of the  FCU interossei weakness without atrophy. There is slight triceps weakness on the left no weakness on the right. DTRs are 1+ and symmetrical biceps triceps brachioradialis. No long track hyperreflexia negative clonus lower extremities no gait disturbance. Well-healed carpal tunnel incisions. Decreased sensation in the ulnar 3 fingers left hand. Sensation the right and is normal. Well-healed carpal tunnel incisions right and left. No cervical lymphadenopathy.  Specialty Comments:  No specialty comments available.  Imaging: No results found.  Exam  Begun  Exam Ended   Final [99] 12/18/2015 2:29 PM 12/18/2015 3:05 PM  PACS Images   Show images for MR Cervical Spine w/o contrast  Study Result   CLINICAL DATA:  Neck pain and LEFT arm numbness for 2 months. Evaluate LEFT C7 radiculopathy.  EXAM: MRI CERVICAL SPINE WITHOUT CONTRAST  TECHNIQUE: Multiplanar, multisequence MR imaging of the cervical spine was performed. No intravenous contrast was administered.  COMPARISON:  Cervical spine radiographs December 01, 2015  FINDINGS: ALIGNMENT: Maintained cervical lordosis. Grade 1 (3 mm) C6-7 anterolisthesis and grade 1 (2 mm) T1-2 anterolisthesis  VERTEBRAE/DISCS: Vertebral bodies are intact. Moderate C5-6 disc height loss, remaining disc morphology is maintained with decreased T2 signal within the disc compatible with desiccation. Severe acute on chronic discogenic endplate changes [id number], moderate chronic discogenic endplate changes [id number] and mild at C6-7. Bright STIR bone marrow edema bilateral T1-2 facets.  CORD:Cervical spinal cord is normal morphology and signal characteristics from the cervicomedullary junction to level of T1-2, the most caudal well visualized level.  POSTERIOR FOSSA, VERTEBRAL ARTERIES, PARASPINAL TISSUES: No MR findings of ligamentous injury. Vertebral artery flow voids present. Included posterior fossa and paraspinal soft tissues are normal.  DISC LEVELS:  C2-3: No disc bulge. Uncovertebral hypertrophy and moderate to severe RIGHT, moderate LEFT facet arthropathy. No canal stenosis. Mild RIGHT neural foraminal narrowing.  C3-4: Moderate LEFT central to subarticular disc protrusion. Uncovertebral hypertrophy and moderate RIGHT, severe LEFT facet arthropathy. No canal stenosis. Mild RIGHT, moderate to severe LEFT neural foraminal narrowing.  C4-5: Small to moderate broad-based disc bulge, uncovertebral hypertrophy. Severe RIGHT, moderate LEFT facet arthropathy. No canal stenosis.  Moderate to severe bilateral neural foraminal narrowing.  C5-6: 5 mm broad-based disc bulge. Uncovertebral hypertrophy and moderate to severe facet arthropathy. Mild canal stenosis. Severe bilateral neural foraminal narrowing.  C6-7: Anterolisthesis. Small broad-based disc bulge. Severe facet arthropathy. No canal stenosis. Mild neural foraminal narrowing.  C7-T1: No disc bulge, canal stenosis nor neural foraminal narrowing. Severe facet arthropathy.  IMPRESSION: Multilevel severe facet arthropathy ; bone marrow edema T1-2 facets consistent with reactive changes.  Grade 1 C6-7 and grade 1 T1-2 anterolisthesis.  No canal stenosis. Severe neural foraminal narrowing C5-6, moderate to severe at C3-4 and C4-5.   Electronically Signed   By: Elon Alas M.D.   On: 12/18/2015 19:43      PMFS History: Patient Active Problem List   Diagnosis Date Noted  . Neck pain 12/01/2015  . Osteoarthritis of spine with radiculopathy, cervical region 12/01/2015  . Diverticulitis of colon with perforation 10/20/2010   Past Medical History:  Diagnosis Date  . Diverticulitis of colon   . GERD (gastroesophageal reflux disease)   . Hypertension   . Thyroid disease     No family history on file.  Past Surgical History:  Procedure Laterality Date  . ANKLE FRACTURE SURGERY    . CARPAL TUNNEL RELEASE Bilateral   . 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 JOINT REPLACEMENT  11/10   right  knee replaced   . KNEE SURGERY    . LEG SURGERY  11/03   broken lower right leg crushed    Social History   Occupational History  . Not on file.   Social History Main Topics  . Smoking status: Former Smoker    Types: Cigarettes  . Smokeless tobacco: Former Systems developer    Types: Chew  . Alcohol use Yes  . Drug use: No  . Sexual activity: Not on file

## 2015-12-27 ENCOUNTER — Encounter (INDEPENDENT_AMBULATORY_CARE_PROVIDER_SITE_OTHER): Payer: Self-pay | Admitting: Orthopaedic Surgery

## 2016-03-07 ENCOUNTER — Encounter (INDEPENDENT_AMBULATORY_CARE_PROVIDER_SITE_OTHER): Payer: Self-pay | Admitting: Orthopaedic Surgery

## 2016-03-07 ENCOUNTER — Ambulatory Visit (INDEPENDENT_AMBULATORY_CARE_PROVIDER_SITE_OTHER): Payer: 59

## 2016-03-07 ENCOUNTER — Ambulatory Visit (INDEPENDENT_AMBULATORY_CARE_PROVIDER_SITE_OTHER): Payer: 59 | Admitting: Orthopaedic Surgery

## 2016-03-07 VITALS — BP 116/69 | HR 63

## 2016-03-07 DIAGNOSIS — M25531 Pain in right wrist: Secondary | ICD-10-CM

## 2016-03-07 DIAGNOSIS — M4722 Other spondylosis with radiculopathy, cervical region: Secondary | ICD-10-CM

## 2016-03-07 NOTE — Progress Notes (Signed)
 Office Visit Note   Patient: [name]           Date of Birth: 10/24/1950           MRN: [id number] Visit Date: 03/07/2016              Requested by: Darius Lundberg Hepler, PA-C [address] [place],  [address] PCP: HEPLER,Kimela Malstrom, PA-C   Assessment & Plan: Visit Diagnoses:  1. Pain in right wrist   2. Other spondylosis with radiculopathy, cervical region   3. 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  Plan: Injection performed with good wrist pain relief right radiocarpal joint. If he has persistent symptoms we can consider hand surgery referral for surgical options. He can continue to use his wrist splint.   Follow-Up Instructions: Return if symptoms worsen or fail to improve.   Orders:  Orders Placed This Encounter  Procedures  . Medium Joint Injection/Arthrocentesis  . XR Wrist Complete Right   No orders of the defined types were placed in this encounter.     Procedures: Medium Joint Inj Date/Time: 03/07/2016 4:20 PM Performed by: Hildreth Robart C Authorized by: Kailyn Vanderslice C   Consent Given by:  Patient Indications:  Pain Location:  Wrist Site:  R radiocarpal Needle Size:  25 G Needle Length:  1.5 inches Approach:  Dorsal Ultrasound Guided: No   Fluoroscopic Guidance: No   Medications:  1 mL lidocaine 1 %; 1 mL bupivacaine 0.5 %; 40 mg methylPREDNISolone acetate 40 MG/ML Aspiration Attempted: No   Patient tolerance:  Patient tolerated the procedure well with no immediate complications     Clinical Data: No additional findings.   Subjective: Chief Complaint  Patient presents with  . Neck - Pain    Patient returns for follow up neck pain. He states that physical therapy has helped with the stiffness in his neck, but he continues to have numbness in his left 4th and 5th fingers and palm. He is also complaining with difficulty in holding  a pen in the right hand.     Review of Systems 14 point review of systems updated and is unchanged from 12/23/2016 office note other than increase right wrist pain. Next symptoms have improved and are stable at this point.   Objective: Vital Signs: BP 116/69   Pulse 63   Physical Exam  Constitutional: He is oriented to person, place, and time. He appears well-developed and well-nourished.  HENT:  Head: Normocephalic and atraumatic.  Eyes: EOM are normal. Pupils are equal, round, and reactive to light.  Neck: No tracheal deviation present. No thyromegaly present.  Cardiovascular: Normal rate.   Pulmonary/Chest: Effort normal. He has no wheezes.  Abdominal: Soft. Bowel sounds are normal.  Musculoskeletal:  Patient has 50% rotation. Bilateral radial plexus tenderness of the cervical spine. No supraclavicular lymphadenopathy. Negative shoulder impingement. Right wrist shows an effusion tenderness with palpation. Carpal tunnel exam is normal. Negative Finkelstein test. Second through sixth extensor compartments are normal. Some subluxation at the first CMC joint mildly tender negative grind test although there is crepitus. Good thenar and hyperthenar strength. No interosseous atrophy. Opposite left wrist shows full range of motion no tenderness. Right wrist has 50% flexion-extension with pain. Pain with radial ulnar deviation.  Neurological: He is alert and oriented to person, place, and time.  Skin: Skin is warm and dry. Capillary refill takes less than 2 seconds.  Psychiatric: He has   a normal mood and affect. His behavior is normal. Judgment and thought content normal.    Ortho Exam  Specialty Comments:  No specialty comments available.  Imaging: No results found.   PMFS History: Patient Active Problem List   Diagnosis Date Noted  . Pain in right wrist 03/07/2016  . Neck pain 12/01/2015  . Other spondylosis with radiculopathy, cervical region 12/01/2015  . Diverticulitis of  colon with perforation 10/20/2010   Past Medical History:  Diagnosis Date  . Diverticulitis of colon   . GERD (gastroesophageal reflux disease)   . Hypertension   . Thyroid disease     No family history on file.  Past Surgical History:  Procedure Laterality Date  . ANKLE FRACTURE SURGERY    . CARPAL TUNNEL RELEASE Bilateral   . 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 JOINT REPLACEMENT  11/10   right knee replaced   . KNEE SURGERY    . LEG SURGERY  11/03   broken lower right leg crushed    Social History   Occupational History  . Not on file.   Social History Main Topics  . Smoking status: Former Smoker    Types: Cigarettes  . Smokeless tobacco: Former User    Types: Chew  . Alcohol use Yes  . Drug use: No  . Sexual activity: Not on file       

## 2016-03-09 MED ORDER — METHYLPREDNISOLONE ACETATE 40 MG/ML IJ SUSP
40.0000 mg | INTRAMUSCULAR | Status: AC | PRN
  Administered 2016-03-07: 40 mg via INTRA_ARTICULAR

## 2016-03-09 MED ORDER — LIDOCAINE HCL 1 % IJ SOLN
1.0000 mL | INTRAMUSCULAR | Status: AC | PRN
  Administered 2016-03-07: 1 mL

## 2016-03-09 MED ORDER — BUPIVACAINE HCL 0.5 % IJ SOLN
1.0000 mL | INTRAMUSCULAR | Status: AC | PRN
  Administered 2016-03-07: 1 mL via INTRA_ARTICULAR

## 2016-03-14 ENCOUNTER — Other Ambulatory Visit (INDEPENDENT_AMBULATORY_CARE_PROVIDER_SITE_OTHER): Payer: Self-pay | Admitting: Orthopaedic Surgery

## 2016-03-14 DIAGNOSIS — M5020 Other cervical disc displacement, unspecified cervical region: Secondary | ICD-10-CM

## 2016-03-24 ENCOUNTER — Encounter (HOSPITAL_COMMUNITY)
Admission: RE | Admit: 2016-03-24 | Discharge: 2016-03-24 | Disposition: A | Discharge status: Home / Self Care | Payer: 59 | Source: Ambulatory Visit | Attending: Orthopaedic Surgery | Admitting: Orthopaedic Surgery

## 2016-03-24 ENCOUNTER — Encounter (HOSPITAL_COMMUNITY): Payer: Self-pay

## 2016-03-24 ENCOUNTER — Ambulatory Visit (HOSPITAL_COMMUNITY): Admission: RE | Admit: 2016-03-24 | Payer: 59 | Source: Ambulatory Visit

## 2016-03-24 DIAGNOSIS — M47892 Other spondylosis, cervical region: Secondary | ICD-10-CM | POA: Insufficient documentation

## 2016-03-24 DIAGNOSIS — Z01810 Encounter for preprocedural cardiovascular examination: Secondary | ICD-10-CM | POA: Insufficient documentation

## 2016-03-24 DIAGNOSIS — Z01818 Encounter for other preprocedural examination: Secondary | ICD-10-CM | POA: Diagnosis present

## 2016-03-24 HISTORY — DX: Unspecified osteoarthritis, unspecified site: M19.90

## 2016-03-24 HISTORY — DX: Hypothyroidism, unspecified: E03.9

## 2016-03-24 HISTORY — DX: Type 2 diabetes mellitus without complications: E11.9

## 2016-03-24 HISTORY — DX: Headache, unspecified: R51.9

## 2016-03-24 HISTORY — DX: Headache: R51

## 2016-03-24 LAB — URINALYSIS, ROUTINE W REFLEX MICROSCOPIC
BILIRUBIN URINE: NEGATIVE
Glucose, UA: NEGATIVE mg/dL
Hgb urine dipstick: NEGATIVE
Ketones, ur: NEGATIVE mg/dL
LEUKOCYTES UA: NEGATIVE
NITRITE: NEGATIVE
PROTEIN: NEGATIVE mg/dL
Specific Gravity, Urine: 1.016 (ref 1.005–1.030)
pH: 5 (ref 5.0–8.0)

## 2016-03-24 LAB — COMPREHENSIVE METABOLIC PANEL
ALT: 23 U/L (ref 17–63)
ANION GAP: 8 (ref 5–15)
AST: 26 U/L (ref 15–41)
Albumin: 3.7 g/dL (ref 3.5–5.0)
Alkaline Phosphatase: 55 U/L (ref 38–126)
BILIRUBIN TOTAL: 0.5 mg/dL (ref 0.3–1.2)
BUN: 28 mg/dL — ABNORMAL HIGH (ref 6–20)
CHLORIDE: 101 mmol/L (ref 101–111)
CO2: 28 mmol/L (ref 22–32)
Calcium: 9.1 mg/dL (ref 8.9–10.3)
Creatinine, Ser: 1.37 mg/dL — ABNORMAL HIGH (ref 0.61–1.24)
GFR calc Af Amer: >60 mL/min (ref >60)
GFR, EST NON AFRICAN AMERICAN: 53 mL/min — AB (ref >60)
Glucose, Bld: 125 mg/dL — ABNORMAL HIGH (ref 65–99)
POTASSIUM: 3.7 mmol/L (ref 3.5–5.1)
Sodium: 137 mmol/L (ref 135–145)
TOTAL PROTEIN: 6.3 g/dL — AB (ref 6.5–8.1)

## 2016-03-24 LAB — SURGICAL PCR SCREEN
MRSA, PCR: NEGATIVE
Staphylococcus aureus: NEGATIVE

## 2016-03-24 LAB — APTT: APTT: 28 s (ref 24–36)

## 2016-03-24 LAB — CBC
HEMATOCRIT: 38.8 % — AB (ref 39.0–52.0)
Hemoglobin: 12.8 g/dL — ABNORMAL LOW (ref 13.0–17.0)
MCH: 29.4 pg (ref 26.0–34.0)
MCHC: 33 g/dL (ref 30.0–36.0)
MCV: 89.2 fL (ref 78.0–100.0)
PLATELETS: 259 10*3/uL (ref 150–400)
RBC: 4.35 MIL/uL (ref 4.22–5.81)
RDW: 13.7 % (ref 11.5–15.5)
WBC: 7.1 10*3/uL (ref 4.0–10.5)

## 2016-03-24 LAB — PROTIME-INR
INR: 1
PROTHROMBIN TIME: 13.2 s (ref 11.4–15.2)

## 2016-03-24 LAB — GLUCOSE, CAPILLARY: GLUCOSE-CAPILLARY: 162 mg/dL — AB (ref 65–99)

## 2016-03-24 NOTE — Pre-Procedure Instructions (Signed)
    [name]  03/24/2016      [id number] - [place], [place] [address] [place] [address] Phone: [phone] Fax: [phone]    Your procedure is scheduled on March 29, 2016  Report to [facility] Admitting at 10:30 A.M.  Call this number if you have problems the morning of surgery:  [phone]   Remember:  Do not eat food or drink liquids after midnight.  Take these medicines the morning of surgery with A SIP OF WATER pantoprazole (PROTONIX), levothyroxine  (SYNTHROID, LEVOTHROID   Stop 7 days prior to surgery aspirin, aleve, naproxen, ibuprofen, motrin, advil, goody's BC, herbal medicines,  vitamins and fish oils.   Do not wear jewelry  Do not wear lotions, powders, or cologne, or deoderant.  Men may shave face and neck.  Do not bring valuables to the hospital.  [facility] is not responsible for any belongings or valuables.  Contacts, dentures or bridgework may not be worn into surgery.  Leave your suitcase in the car.  After surgery it may be brought to your room.  For patients admitted to the hospital, discharge time will be determined by your treatment team.  Patients discharged the day of surgery will not be allowed to drive.   [facility]- Preparing For Surgery  Before surgery, you can play an important role. Because skin is not sterile, your skin needs to be as free of germs as possible. You can reduce the number of germs on your skin by washing with CHG (chlorahexidine gluconate) Soap before surgery.  CHG is an antiseptic cleaner which kills germs and bonds with the skin to continue killing germs even after washing.  Please do not use if you have an allergy to CHG or antibacterial soaps. If your skin becomes reddened/irritated stop using the CHG.  Do not shave (including legs and underarms) for at least 48 hours prior to first CHG shower. It is OK to shave your face.  Please follow these  instructions carefully.   1. Shower the NIGHT BEFORE SURGERY and the MORNING OF SURGERY with CHG.   2. If you chose to wash your hair, wash your hair first as usual with your normal shampoo.  3. After you shampoo, rinse your hair and body thoroughly to remove the shampoo.  4. Use CHG as you would any other liquid soap. You can apply CHG directly to the skin and wash gently with a scrungie or a clean washcloth.   5. Apply the CHG Soap to your body ONLY FROM THE NECK DOWN.  Do not use on open wounds or open sores. Avoid contact with your eyes, ears, mouth and genitals (private parts). Wash genitals (private parts) with your normal soap.  6. Wash thoroughly, paying special attention to the area where your surgery will be performed.  7. Thoroughly rinse your body with warm water from the neck down.  8. DO NOT shower/wash with your normal soap after using and rinsing off the CHG Soap.  9. Pat yourself dry with a CLEAN TOWEL.   10. Wear CLEAN PAJAMAS   11. Place CLEAN SHEETS on your bed the night of your first shower and DO NOT SLEEP WITH PETS.    Day of Surgery: Do not apply any deodorants/lotions. Please wear clean clothes to the hospital/surgery center.

## 2016-03-25 LAB — HEMOGLOBIN A1C
HEMOGLOBIN A1C: 6.2 % — AB (ref 4.8–5.6)
Mean Plasma Glucose: 131 mg/dL

## 2016-03-27 ENCOUNTER — Telehealth (INDEPENDENT_AMBULATORY_CARE_PROVIDER_SITE_OTHER): Payer: Self-pay | Admitting: Orthopedic Surgery

## 2016-03-27 NOTE — Telephone Encounter (Signed)
Ok for letter

## 2016-03-27 NOTE — Telephone Encounter (Signed)
OK to do time 4 months thanks

## 2016-03-27 NOTE — Telephone Encounter (Signed)
Patient called needing a letter written to get him out of jury duty in April sent to his home address if possible. He gets his surgery with Dr. Lorin Mercy Wednesday. CB # [phone]

## 2016-03-28 ENCOUNTER — Telehealth (INDEPENDENT_AMBULATORY_CARE_PROVIDER_SITE_OTHER): Payer: Self-pay | Admitting: Radiology

## 2016-03-28 NOTE — Telephone Encounter (Signed)
Note completed x 4 months and mailed to patient's home address.

## 2016-03-28 NOTE — Telephone Encounter (Signed)
Please precert these codes per Dr. Lorin Mercy : 90122, 24114, [id number]+, [id number] for this pts surgery on 03/29/2016.  The Dr. From insurance did call Dr. Lorin Mercy back and and these are the codes to be used.  Thanks

## 2016-03-30 ENCOUNTER — Telehealth (INDEPENDENT_AMBULATORY_CARE_PROVIDER_SITE_OTHER): Payer: Self-pay | Admitting: Orthopaedic Surgery

## 2016-03-30 NOTE — Telephone Encounter (Signed)
**  We have authorization to speak with [facility]** Patient dropped off a valid signed authorization.

## 2016-04-05 ENCOUNTER — Observation Stay (HOSPITAL_COMMUNITY)
Admission: RE | Admit: 2016-04-05 | Discharge: 2016-04-06 | Disposition: A | Discharge status: Home / Self Care | Payer: 59 | Source: Ambulatory Visit | Attending: Orthopaedic Surgery | Admitting: Orthopaedic Surgery

## 2016-04-05 ENCOUNTER — Encounter (HOSPITAL_COMMUNITY)
Admission: RE | Disposition: A | Discharge status: Home / Self Care | Payer: Self-pay | Source: Ambulatory Visit | Attending: Orthopaedic Surgery

## 2016-04-05 ENCOUNTER — Ambulatory Visit (HOSPITAL_COMMUNITY): Payer: 59

## 2016-04-05 ENCOUNTER — Ambulatory Visit (HOSPITAL_COMMUNITY): Payer: 59 | Admitting: Anesthesiology

## 2016-04-05 ENCOUNTER — Encounter (HOSPITAL_COMMUNITY): Payer: Self-pay | Admitting: Certified Registered Nurse Anesthetist

## 2016-04-05 DIAGNOSIS — M50122 Cervical disc disorder at C5-C6 level with radiculopathy: Secondary | ICD-10-CM | POA: Insufficient documentation

## 2016-04-05 DIAGNOSIS — K219 Gastro-esophageal reflux disease without esophagitis: Secondary | ICD-10-CM | POA: Diagnosis not present

## 2016-04-05 DIAGNOSIS — I10 Essential (primary) hypertension: Secondary | ICD-10-CM | POA: Insufficient documentation

## 2016-04-05 DIAGNOSIS — Z885 Allergy status to narcotic agent status: Secondary | ICD-10-CM | POA: Diagnosis not present

## 2016-04-05 DIAGNOSIS — M50123 Cervical disc disorder at C6-C7 level with radiculopathy: Secondary | ICD-10-CM | POA: Insufficient documentation

## 2016-04-05 DIAGNOSIS — Z7982 Long term (current) use of aspirin: Secondary | ICD-10-CM | POA: Insufficient documentation

## 2016-04-05 DIAGNOSIS — M4722 Other spondylosis with radiculopathy, cervical region: Secondary | ICD-10-CM | POA: Insufficient documentation

## 2016-04-05 DIAGNOSIS — Z87891 Personal history of nicotine dependence: Secondary | ICD-10-CM | POA: Diagnosis not present

## 2016-04-05 DIAGNOSIS — M1990 Unspecified osteoarthritis, unspecified site: Secondary | ICD-10-CM | POA: Insufficient documentation

## 2016-04-05 DIAGNOSIS — M5020 Other cervical disc displacement, unspecified cervical region: Secondary | ICD-10-CM

## 2016-04-05 DIAGNOSIS — M4802 Spinal stenosis, cervical region: Principal | ICD-10-CM | POA: Insufficient documentation

## 2016-04-05 DIAGNOSIS — Z7984 Long term (current) use of oral hypoglycemic drugs: Secondary | ICD-10-CM | POA: Diagnosis not present

## 2016-04-05 DIAGNOSIS — E039 Hypothyroidism, unspecified: Secondary | ICD-10-CM | POA: Diagnosis not present

## 2016-04-05 DIAGNOSIS — E119 Type 2 diabetes mellitus without complications: Secondary | ICD-10-CM | POA: Insufficient documentation

## 2016-04-05 DIAGNOSIS — Z419 Encounter for procedure for purposes other than remedying health state, unspecified: Secondary | ICD-10-CM

## 2016-04-05 DIAGNOSIS — Z01811 Encounter for preprocedural respiratory examination: Secondary | ICD-10-CM

## 2016-04-05 HISTORY — PX: ANTERIOR FUSION CERVICAL SPINE: SUR626

## 2016-04-05 HISTORY — PX: ANTERIOR CERVICAL DECOMP/DISCECTOMY FUSION: SHX1161

## 2016-04-05 LAB — GLUCOSE, CAPILLARY
GLUCOSE-CAPILLARY: 106 mg/dL — AB (ref 65–99)
GLUCOSE-CAPILLARY: 156 mg/dL — AB (ref 65–99)

## 2016-04-05 SURGERY — ANTERIOR CERVICAL DECOMPRESSION/DISCECTOMY FUSION 2 LEVELS
Anesthesia: General

## 2016-04-05 MED ORDER — FENTANYL CITRATE (PF) 250 MCG/5ML IJ SOLN
INTRAMUSCULAR | Status: DC | PRN
  Administered 2016-04-05: 50 ug via INTRAVENOUS
  Administered 2016-04-05: 150 ug via INTRAVENOUS
  Administered 2016-04-05: 50 ug via INTRAVENOUS

## 2016-04-05 MED ORDER — HEMOSTATIC AGENTS (NO CHARGE) OPTIME
TOPICAL | Status: DC | PRN
  Administered 2016-04-05: 1 via TOPICAL

## 2016-04-05 MED ORDER — 0.9 % SODIUM CHLORIDE (POUR BTL) OPTIME
TOPICAL | Status: DC | PRN
  Administered 2016-04-05: 1000 mL

## 2016-04-05 MED ORDER — BUPIVACAINE HCL (PF) 0.25 % IJ SOLN
INTRAMUSCULAR | Status: AC
  Filled 2016-04-05: qty 30

## 2016-04-05 MED ORDER — DOCUSATE SODIUM 100 MG PO CAPS
100.0000 mg | ORAL_CAPSULE | Freq: Two times a day (BID) | ORAL | Status: DC
  Administered 2016-04-05 – 2016-04-06 (×2): 100 mg via ORAL
  Filled 2016-04-05 (×2): qty 1

## 2016-04-05 MED ORDER — INSULIN ASPART 100 UNIT/ML ~~LOC~~ SOLN
0.0000 [IU] | Freq: Three times a day (TID) | SUBCUTANEOUS | Status: DC
  Administered 2016-04-05: 3 [IU] via SUBCUTANEOUS
  Administered 2016-04-06: 2 [IU] via SUBCUTANEOUS

## 2016-04-05 MED ORDER — ONDANSETRON HCL 4 MG/2ML IJ SOLN
4.0000 mg | Freq: Four times a day (QID) | INTRAMUSCULAR | Status: DC | PRN
Start: 2016-04-05 — End: 2016-04-06
  Administered 2016-04-06: 4 mg via INTRAVENOUS
  Filled 2016-04-05: qty 2

## 2016-04-05 MED ORDER — LOSARTAN POTASSIUM 50 MG PO TABS
100.0000 mg | ORAL_TABLET | Freq: Every day | ORAL | Status: DC
  Administered 2016-04-05 – 2016-04-06 (×2): 100 mg via ORAL
  Filled 2016-04-05: qty 2

## 2016-04-05 MED ORDER — ROCURONIUM BROMIDE 50 MG/5ML IV SOSY
PREFILLED_SYRINGE | INTRAVENOUS | Status: AC
  Filled 2016-04-05: qty 5

## 2016-04-05 MED ORDER — CEFAZOLIN SODIUM-DEXTROSE 2-4 GM/100ML-% IV SOLN
2.0000 g | INTRAVENOUS | Status: AC
  Administered 2016-04-05: 2 g via INTRAVENOUS
  Filled 2016-04-05: qty 100

## 2016-04-05 MED ORDER — METHOCARBAMOL 500 MG PO TABS: 500.0000 mg | ORAL_TABLET | Freq: Four times a day (QID) | ORAL | 0 refills | Status: DC | PRN

## 2016-04-05 MED ORDER — SODIUM CHLORIDE 0.9 % IV SOLN
INTRAVENOUS | Status: DC
  Administered 2016-04-05: 17:00:00 via INTRAVENOUS

## 2016-04-05 MED ORDER — EPHEDRINE 5 MG/ML INJ
INTRAVENOUS | Status: AC
  Filled 2016-04-05: qty 10

## 2016-04-05 MED ORDER — SUGAMMADEX SODIUM 200 MG/2ML IV SOLN
INTRAVENOUS | Status: DC | PRN
  Administered 2016-04-05: 200 mg via INTRAVENOUS

## 2016-04-05 MED ORDER — POLYETHYLENE GLYCOL 3350 17 G PO PACK
17.0000 g | PACK | Freq: Every day | ORAL | Status: DC
  Filled 2016-04-05: qty 1

## 2016-04-05 MED ORDER — LEVOTHYROXINE SODIUM 100 MCG PO TABS
100.0000 ug | ORAL_TABLET | Freq: Every day | ORAL | Status: DC
  Administered 2016-04-06: 100 ug via ORAL
  Filled 2016-04-05: qty 1

## 2016-04-05 MED ORDER — FENTANYL CITRATE (PF) 100 MCG/2ML IJ SOLN: 25.0000 ug | INTRAMUSCULAR | Status: DC | PRN

## 2016-04-05 MED ORDER — OXYCODONE-ACETAMINOPHEN 5-325 MG PO TABS
1.0000 | ORAL_TABLET | Freq: Four times a day (QID) | ORAL | 0 refills | Status: DC | PRN
Start: 2016-04-05 — End: 2016-06-22

## 2016-04-05 MED ORDER — PHENOL 1.4 % MT LIQD
1.0000 | OROMUCOSAL | Status: DC | PRN
  Administered 2016-04-05: 1 via OROMUCOSAL
  Filled 2016-04-05: qty 177

## 2016-04-05 MED ORDER — OXYCODONE-ACETAMINOPHEN 5-325 MG PO TABS: 1.0000 | ORAL_TABLET | Freq: Four times a day (QID) | ORAL | 0 refills | Status: DC | PRN

## 2016-04-05 MED ORDER — LIDOCAINE 2% (20 MG/ML) 5 ML SYRINGE
INTRAMUSCULAR | Status: AC
  Filled 2016-04-05: qty 5

## 2016-04-05 MED ORDER — LACTATED RINGERS IV SOLN
INTRAVENOUS | Status: DC
  Administered 2016-04-05: 12:00:00 via INTRAVENOUS

## 2016-04-05 MED ORDER — ONDANSETRON HCL 4 MG PO TABS: 4.0000 mg | ORAL_TABLET | Freq: Four times a day (QID) | ORAL | Status: DC | PRN

## 2016-04-05 MED ORDER — ACETAMINOPHEN 650 MG RE SUPP: 650.0000 mg | RECTAL | Status: DC | PRN

## 2016-04-05 MED ORDER — SODIUM CHLORIDE 0.9% FLUSH: 3.0000 mL | INTRAVENOUS | Status: DC | PRN

## 2016-04-05 MED ORDER — METHOCARBAMOL 500 MG PO TABS
500.0000 mg | ORAL_TABLET | Freq: Four times a day (QID) | ORAL | Status: DC | PRN
  Administered 2016-04-05: 500 mg via ORAL
  Filled 2016-04-05: qty 1

## 2016-04-05 MED ORDER — PHENYLEPHRINE HCL 10 MG/ML IJ SOLN
INTRAVENOUS | Status: DC | PRN
  Administered 2016-04-05: 50 ug/min via INTRAVENOUS

## 2016-04-05 MED ORDER — GLYCOPYRROLATE 0.2 MG/ML IJ SOLN
INTRAMUSCULAR | Status: DC | PRN
  Administered 2016-04-05: 0.2 mg via INTRAVENOUS

## 2016-04-05 MED ORDER — PROPOFOL 10 MG/ML IV BOLUS
INTRAVENOUS | Status: DC | PRN
  Administered 2016-04-05: 50 mg via INTRAVENOUS
  Administered 2016-04-05: 150 mg via INTRAVENOUS

## 2016-04-05 MED ORDER — EPINEPHRINE PF 1 MG/ML IJ SOLN
INTRAMUSCULAR | Status: AC
  Filled 2016-04-05: qty 1

## 2016-04-05 MED ORDER — PANTOPRAZOLE SODIUM 20 MG PO TBEC
20.0000 mg | DELAYED_RELEASE_TABLET | Freq: Every day | ORAL | Status: DC
  Administered 2016-04-06: 20 mg via ORAL
  Filled 2016-04-05: qty 1

## 2016-04-05 MED ORDER — MIDAZOLAM HCL 2 MG/2ML IJ SOLN
INTRAMUSCULAR | Status: AC
  Filled 2016-04-05: qty 2

## 2016-04-05 MED ORDER — SUCCINYLCHOLINE CHLORIDE 200 MG/10ML IV SOSY
PREFILLED_SYRINGE | INTRAVENOUS | Status: AC
  Filled 2016-04-05: qty 10

## 2016-04-05 MED ORDER — FENTANYL CITRATE (PF) 250 MCG/5ML IJ SOLN
INTRAMUSCULAR | Status: AC
  Filled 2016-04-05: qty 5

## 2016-04-05 MED ORDER — BUPIVACAINE-EPINEPHRINE 0.25% -1:200000 IJ SOLN
INTRAMUSCULAR | Status: DC | PRN
Start: 2016-04-05 — End: 2016-04-05
  Administered 2016-04-05: 6 mL

## 2016-04-05 MED ORDER — EPHEDRINE SULFATE 50 MG/ML IJ SOLN
INTRAMUSCULAR | Status: DC | PRN
  Administered 2016-04-05: 10 mg via INTRAVENOUS

## 2016-04-05 MED ORDER — CHLORHEXIDINE GLUCONATE 4 % EX LIQD: 60.0000 mL | Freq: Once | CUTANEOUS | Status: DC

## 2016-04-05 MED ORDER — HYDROMORPHONE HCL 1 MG/ML IJ SOLN: 0.5000 mg | INTRAMUSCULAR | Status: DC | PRN

## 2016-04-05 MED ORDER — METFORMIN HCL ER 500 MG PO TB24: 1000.0000 mg | ORAL_TABLET | Freq: Every day | ORAL | Status: DC

## 2016-04-05 MED ORDER — MIDAZOLAM HCL 2 MG/2ML IJ SOLN
INTRAMUSCULAR | Status: DC | PRN
Start: 2016-04-05 — End: 2016-04-05
  Administered 2016-04-05 (×2): 1 mg via INTRAVENOUS

## 2016-04-05 MED ORDER — LOSARTAN POTASSIUM-HCTZ 100-12.5 MG PO TABS: 1.0000 | ORAL_TABLET | Freq: Every day | ORAL | Status: DC

## 2016-04-05 MED ORDER — ONDANSETRON HCL 4 MG/2ML IJ SOLN
INTRAMUSCULAR | Status: AC
  Filled 2016-04-05: qty 4

## 2016-04-05 MED ORDER — LIDOCAINE HCL (CARDIAC) 20 MG/ML IV SOLN
INTRAVENOUS | Status: DC | PRN
  Administered 2016-04-05: 100 mg via INTRATRACHEAL

## 2016-04-05 MED ORDER — ROCURONIUM BROMIDE 100 MG/10ML IV SOLN
INTRAVENOUS | Status: DC | PRN
  Administered 2016-04-05: 50 mg via INTRAVENOUS

## 2016-04-05 MED ORDER — ARTIFICIAL TEARS OP OINT
TOPICAL_OINTMENT | OPHTHALMIC | Status: AC
  Filled 2016-04-05: qty 3.5

## 2016-04-05 MED ORDER — DEXTROSE 5 % IV SOLN
500.0000 mg | Freq: Four times a day (QID) | INTRAVENOUS | Status: DC | PRN
  Filled 2016-04-05: qty 5

## 2016-04-05 MED ORDER — SODIUM CHLORIDE 0.9% FLUSH
3.0000 mL | Freq: Two times a day (BID) | INTRAVENOUS | Status: DC
  Administered 2016-04-05 – 2016-04-06 (×2): 3 mL via INTRAVENOUS

## 2016-04-05 MED ORDER — HYDROMORPHONE HCL 2 MG/ML IJ SOLN: 0.5000 mg | INTRAMUSCULAR | Status: DC | PRN

## 2016-04-05 MED ORDER — ACETAMINOPHEN 325 MG PO TABS
650.0000 mg | ORAL_TABLET | ORAL | Status: DC | PRN
Start: 2016-04-05 — End: 2016-04-06
  Administered 2016-04-05 – 2016-04-06 (×3): 650 mg via ORAL
  Filled 2016-04-05 (×3): qty 2

## 2016-04-05 MED ORDER — LACTATED RINGERS IV SOLN
INTRAVENOUS | Status: DC | PRN
  Administered 2016-04-05 (×2): via INTRAVENOUS

## 2016-04-05 MED ORDER — MENTHOL 3 MG MT LOZG
1.0000 | LOZENGE | OROMUCOSAL | Status: DC | PRN
  Filled 2016-04-05: qty 9

## 2016-04-05 MED ORDER — HYDROCHLOROTHIAZIDE 12.5 MG PO CAPS
12.5000 mg | ORAL_CAPSULE | Freq: Every day | ORAL | Status: DC
  Administered 2016-04-05 – 2016-04-06 (×2): 12.5 mg via ORAL
  Filled 2016-04-05 (×2): qty 1

## 2016-04-05 MED ORDER — OXYCODONE HCL 5 MG PO TABS
5.0000 mg | ORAL_TABLET | ORAL | Status: DC | PRN
  Administered 2016-04-05: 5 mg via ORAL
  Administered 2016-04-05: 10 mg via ORAL
  Filled 2016-04-05: qty 1
  Filled 2016-04-05: qty 2

## 2016-04-05 MED ORDER — ONDANSETRON HCL 4 MG/2ML IJ SOLN
INTRAMUSCULAR | Status: DC | PRN
  Administered 2016-04-05: 4 mg via INTRAVENOUS

## 2016-04-05 MED ORDER — PROPOFOL 10 MG/ML IV BOLUS
INTRAVENOUS | Status: AC
  Filled 2016-04-05: qty 20

## 2016-04-05 MED ORDER — SODIUM CHLORIDE 0.9 % IV SOLN: 250.0000 mL | INTRAVENOUS | Status: DC

## 2016-04-05 SURGICAL SUPPLY — 58 items
APL SKNCLS STERI-STRIP NONHPOA (GAUZE/BANDAGES/DRESSINGS) ×1
BENZOIN TINCTURE PRP APPL 2/3 (GAUZE/BANDAGES/DRESSINGS) ×2 IMPLANT
BIT DRILL SRG 14X2.2XFLT CHK (BIT) IMPLANT
BIT DRL SRG 14X2.2XFLT CHK (BIT) ×1
BLADE CLIPPER SURG (BLADE) IMPLANT
BONE CERV LORDOTIC 14.5X12X7 (Bone Implant) ×4 IMPLANT
BUR ROUND FLUTED 4 SOFT TCH (BURR) IMPLANT
COLLAR CERV LO CONTOUR FIRM DE (SOFTGOODS) IMPLANT
CORDS BIPOLAR (ELECTRODE) ×2 IMPLANT
COVER SURGICAL LIGHT HANDLE (MISCELLANEOUS) ×2 IMPLANT
CRADLE DONUT ADULT HEAD (MISCELLANEOUS) ×2 IMPLANT
DRAIN HEMOVAC 7FR (DRAIN) ×1 IMPLANT
DRAPE C-ARM 42X72 X-RAY (DRAPES) ×2 IMPLANT
DRAPE MICROSCOPE LEICA (MISCELLANEOUS) ×4 IMPLANT
DRAPE PROXIMA HALF (DRAPES) ×2 IMPLANT
DRILL BIT SKYLINE 14MM (BIT) ×2
DURAPREP 6ML APPLICATOR 50/CS (WOUND CARE) ×2 IMPLANT
ELECT COATED BLADE 2.86 ST (ELECTRODE) ×2 IMPLANT
ELECT REM PT RETURN 9FT ADLT (ELECTROSURGICAL) ×2
ELECTRODE REM PT RTRN 9FT ADLT (ELECTROSURGICAL) ×1 IMPLANT
EVACUATOR 1/8 PVC DRAIN (DRAIN) ×2 IMPLANT
GAUZE SPONGE 4X4 12PLY STRL (GAUZE/BANDAGES/DRESSINGS) ×2 IMPLANT
GLOVE BIOGEL PI IND STRL 6.5 (GLOVE) IMPLANT
GLOVE BIOGEL PI IND STRL 8 (GLOVE) ×2 IMPLANT
GLOVE BIOGEL PI INDICATOR 6.5 (GLOVE) ×1
GLOVE BIOGEL PI INDICATOR 8 (GLOVE) ×2
GLOVE ORTHO TXT STRL SZ7.5 (GLOVE) ×4 IMPLANT
GLOVE SURG SS PI 6.5 STRL IVOR (GLOVE) ×1 IMPLANT
GOWN STRL REUS W/ TWL LRG LVL3 (GOWN DISPOSABLE) ×1 IMPLANT
GOWN STRL REUS W/ TWL XL LVL3 (GOWN DISPOSABLE) ×1 IMPLANT
GOWN STRL REUS W/TWL 2XL LVL3 (GOWN DISPOSABLE) ×3 IMPLANT
GOWN STRL REUS W/TWL LRG LVL3 (GOWN DISPOSABLE) ×2
GOWN STRL REUS W/TWL XL LVL3 (GOWN DISPOSABLE) ×2
GRAFT BNE SPCR VG2 14.5X12X7 (Bone Implant) IMPLANT
HEAD HALTER (SOFTGOODS) ×2 IMPLANT
HEMOSTAT SURGICEL 2X14 (HEMOSTASIS) IMPLANT
KIT BASIN OR (CUSTOM PROCEDURE TRAY) ×2 IMPLANT
KIT ROOM TURNOVER OR (KITS) ×2 IMPLANT
MANIFOLD NEPTUNE II (INSTRUMENTS) IMPLANT
NDL 25GX 5/8IN NON SAFETY (NEEDLE) ×1 IMPLANT
NEEDLE 25GX 5/8IN NON SAFETY (NEEDLE) ×2 IMPLANT
NS IRRIG 1000ML POUR BTL (IV SOLUTION) ×2 IMPLANT
PACK ORTHO CERVICAL (CUSTOM PROCEDURE TRAY) ×2 IMPLANT
PAD ARMBOARD 7.5X6 YLW CONV (MISCELLANEOUS) ×4 IMPLANT
PATTIES SURGICAL .5 X.5 (GAUZE/BANDAGES/DRESSINGS) IMPLANT
PIN TEMP SKYLINE THREADED (PIN) ×1 IMPLANT
PLATE TWO LEVEL SKYLINE 30MM (Plate) ×1 IMPLANT
RESTRAINT LIMB HOLDER UNIV (RESTRAINTS) ×1 IMPLANT
SCREW SKYLINE 14MM SD-VA (Screw) ×6 IMPLANT
STRIP CLOSURE SKIN 1/2X4 (GAUZE/BANDAGES/DRESSINGS) ×2 IMPLANT
SURGIFLO W/THROMBIN 8M KIT (HEMOSTASIS) IMPLANT
SUT BONE WAX W31G (SUTURE) ×2 IMPLANT
SUT VIC AB 3-0 X1 27 (SUTURE) ×2 IMPLANT
SUT VICRYL 4-0 PS2 18IN ABS (SUTURE) ×4 IMPLANT
TAPE CLOTH SURG 4X10 WHT LF (GAUZE/BANDAGES/DRESSINGS) ×1 IMPLANT
TOWEL OR 17X24 6PK STRL BLUE (TOWEL DISPOSABLE) ×2 IMPLANT
TOWEL OR 17X26 10 PK STRL BLUE (TOWEL DISPOSABLE) ×2 IMPLANT
TRAY FOLEY CATH 16FR SILVER (SET/KITS/TRAYS/PACK) IMPLANT

## 2016-04-05 NOTE — Anesthesia Preprocedure Evaluation (Signed)
Anesthesia Evaluation  Patient identified by MRN, date of birth, ID band Patient awake    Reviewed: Allergy & Precautions, H&P , Patient's Chart, lab work & pertinent test results, reviewed documented beta blocker date and time   Airway Mallampati: II  TM Distance: >3 FB Neck ROM: full    Dental no notable dental hx.    Pulmonary former smoker,    Pulmonary exam normal breath sounds clear to auscultation       Cardiovascular hypertension, On Medications  Rhythm:regular Rate:Normal     Neuro/Psych    GI/Hepatic GERD  ,  Endo/Other  diabetes, Type 2  Renal/GU      Musculoskeletal   Abdominal   Peds  Hematology   Anesthesia Other Findings   Reproductive/Obstetrics                             Anesthesia Physical Anesthesia Plan  ASA: II  Anesthesia Plan: General   Post-op Pain Management:    Induction: Intravenous  Airway Management Planned: Oral ETT  Additional Equipment:   Intra-op Plan:   Post-operative Plan: Extubation in OR  Informed Consent: I have reviewed the patients History and Physical, chart, labs and discussed the procedure including the risks, benefits and alternatives for the proposed anesthesia with the patient or authorized representative who has indicated his/her understanding and acceptance.   Dental Advisory Given  Plan Discussed with: CRNA and Surgeon  Anesthesia Plan Comments: (  )        Anesthesia Quick Evaluation

## 2016-04-05 NOTE — Op Note (Signed)
Preop diagnosis: Cervical spondylosis with stenosis C5-6, C6-7-2  Postoperative diagnosis: Same  Procedure: C5-6, C6-7 anterior cervical discectomy and fusion, allograft and plate.  Surgeon Rodell Perna M.D.  Asst. Benjiman Core PA-C medically necessary and present for the entire procedure  EBL minimal  Implants: 40 mm Depuy Synthes plate 14 mm screws 7 mm cortical cancellus the grafts 2  Procedure after standard prepping draping out all retraction been applied without weight arms tucked the side careful padding neck was prepped with sterile skin Elta Gu[place] was used here squared with towels Betadine Steri-Drape applied and sterile Mayo stand at the head thyroid sheets and drapes. Timeout procedure completed Ancef was given prophylactically. Incision was made based on palpable landmarks starting the midline extending the left. Platysma was divided in line with the fibers. Blunt dissection underneath the omohyoid was performed. Patient had day 2 cm per anteriorly at C5-6 there was easily identifiable and the spray to be partially removed in order. Short 25 needle into the interspace and confirm this with a crosstable lateral C-arm image. Soft tissue retractors were placed C5-6 level was done first. Operative microscope was draped and used to take down the posterior aspect of the discs. Clark curettes pituitaries were performed 4 mm bur was used some. Posteriorly the spurs had been thin there was a half millimeter gap between C5 and C6 still with motion. Microdissection techniques were used to remove the bone peeling off the dura removing this fragments stripping uncovertebral joints. 7 mm graft gave nice fit based on trials countersunk 2 mm. Identical procedure repeated at C6-7. This level and a wider disc space there is more bulging spondylosis both right and left but not as severe posterior osteophytes. Decompression down to the dura was performed 6 mm graft was not the adequate and will still leave 7 mm  restored disc space height and was of appropriate snugness. Plate was selected checked with C-arm adjusted 40 mm plate was selected. Screws were run and drilled placed checked under final C-arm after irrigation some surgeon Tyrone Nine been placed down over the dura and then removed after several minutes before the grafts are placed in the epidural space was dry there was room for egress of fluid on either side of graft fingertip was placed down anterior to T1 to allow any egress of fluid if needed. Hemovac drain was placed draining out technique after the the tiny screwdriver was used to lock all of the screws. All 6 were clicked and then closer's of the platysma after irrigation 3-0 Vicryl for Vicryl subarticular closure tincture benzoin Steri-Strips postop dressing and transferred recovery room.

## 2016-04-05 NOTE — Progress Notes (Signed)
Orthopedic Tech Progress Note Patient Details:  [name] 1950-07-30 [id number]  Ortho Devices Type of Ortho Device: Soft collar Ortho Device/Splint Location: at bedside Ortho Device/Splint Interventions: Criss Alvine 04/05/2016, 5:49 PM

## 2016-04-05 NOTE — Brief Op Note (Signed)
04/05/2016  4:31 PM  PATIENT:  [name]  65 y.o. 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, Spondylosis  PROCEDURE:  Procedure(s): C5-6, C6-7 Anterior Cervical Discectomy and Fusion, Allograft, Plate (N/A)  SURGEON:  Surgeon(s) and Role:    * Marybelle Killings, MD - Primary  PHYSICIAN ASSISTANT: Benjiman Core    ANESTHESIA:   general  EBL:  Total I/O In: 2800 [I.V.:2800] Out: 200 [Urine:100; Blood:100]  BLOOD ADMINISTERED:none  DRAINS: hemovac  LOCAL MEDICATIONS USED:  Marcaine SPECIMEN:  No Specimen  DISPOSITION OF SPECIMEN:  N/A  COUNTS:  YES  TOURNIQUET:  * No tourniquets in log *  DICTATION: .Dragon Dictation  PLAN OF CARE: Admit for overnight observation  PATIENT DISPOSITION:  PACU - hemodynamically stable.

## 2016-04-05 NOTE — Progress Notes (Signed)
Orthopedic Tech Progress Note Patient Details:  [name] [birth year] [id number]  Ortho Devices Type of Ortho Device: Soft collar Ortho Device/Splint Location: at bedside Ortho Device/Splint Interventions: Criss Alvine 04/05/2016, 5:50 PM

## 2016-04-05 NOTE — H&P (View-Only) (Signed)
Office Visit Note   Patient: [name]           Date of Birth: 10/16/1950           MRN: [id number] Visit Date: 03/07/2016              Requested by: Corine Shelter, PA-C [address], [place] [address] PCP: Corine Shelter, PA-C   Assessment & Plan: Visit Diagnoses:  1. Pain in right wrist   2. Other spondylosis with radiculopathy, cervical region   3. 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  Plan: Injection performed with good wrist pain relief right radiocarpal joint. If he has persistent symptoms we can consider hand surgery referral for surgical options. He can continue to use his wrist splint.   Follow-Up Instructions: Return if symptoms worsen or fail to improve.   Orders:  Orders Placed This Encounter  Procedures  . Medium Joint Injection/Arthrocentesis  . XR Wrist Complete Right   No orders of the defined types were placed in this encounter.     Procedures: Medium Joint Inj Date/Time: 03/07/2016 4:20 PM Performed by: Marybelle Killings Authorized by: Marybelle Killings   Consent Given by:  Patient Indications:  Pain Location:  Wrist Site:  R radiocarpal Needle Size:  25 G Needle Length:  1.5 inches Approach:  Dorsal Ultrasound Guided: No   Fluoroscopic Guidance: No   Medications:  1 mL lidocaine 1 %; 1 mL bupivacaine 0.5 %; 40 mg methylPREDNISolone acetate 40 MG/ML Aspiration Attempted: No   Patient tolerance:  Patient tolerated the procedure well with no immediate complications     Clinical Data: No additional findings.   Subjective: Chief Complaint  Patient presents with  . Neck - Pain    Patient returns for follow up neck pain. He states that physical therapy has helped with the stiffness in his neck, but he continues to have numbness in his left 4th and 5th fingers and palm. He is also complaining with difficulty in holding  a pen in the right hand.     Review of Systems 14 point review of systems updated and is unchanged from 12/23/2016 office note other than increase right wrist pain. Next symptoms have improved and are stable at this point.   Objective: Vital Signs: BP 116/69   Pulse 63   Physical Exam  Constitutional: He is oriented to person, place, and time. He appears well-developed and well-nourished.  HENT:  Head: Normocephalic and atraumatic.  Eyes: EOM are normal. Pupils are equal, round, and reactive to light.  Neck: No tracheal deviation present. No thyromegaly present.  Cardiovascular: Normal rate.   Pulmonary/Chest: Effort normal. He has no wheezes.  Abdominal: Soft. Bowel sounds are normal.  Musculoskeletal:  Patient has 50% rotation. Bilateral radial plexus tenderness of the cervical spine. No supraclavicular lymphadenopathy. Negative shoulder impingement. Right wrist shows an effusion tenderness with palpation. Carpal tunnel exam is normal. Negative Finkelstein test. Second through sixth extensor compartments are normal. Some subluxation at the first [facility] joint mildly tender negative grind test although there is crepitus. Good thenar and hyperthenar strength. No interosseous atrophy. Opposite left wrist shows full range of motion no tenderness. Right wrist has 50% flexion-extension with pain. Pain with radial ulnar deviation.  Neurological: He is alert and oriented to person, place, and time.  Skin: Skin is warm and dry. Capillary refill takes less than 2 seconds.  Psychiatric: He has  a normal mood and affect. His behavior is normal. Judgment and thought content normal.    Ortho Exam  Specialty Comments:  No specialty comments available.  Imaging: No results found.   PMFS History: Patient Active Problem List   Diagnosis Date Noted  . Pain in right wrist 03/07/2016  . Neck pain 12/01/2015  . Other spondylosis with radiculopathy, cervical region 12/01/2015  . Diverticulitis of  colon with perforation 10/20/2010   Past Medical History:  Diagnosis Date  . Diverticulitis of colon   . GERD (gastroesophageal reflux disease)   . Hypertension   . Thyroid disease     No family history on file.  Past Surgical History:  Procedure Laterality Date  . ANKLE FRACTURE SURGERY    . CARPAL TUNNEL RELEASE Bilateral   . 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 JOINT REPLACEMENT  11/10   right knee replaced   . KNEE SURGERY    . LEG SURGERY  11/03   broken lower right leg crushed    Social History   Occupational History  . Not on file.   Social History Main Topics  . Smoking status: Former Smoker    Types: Cigarettes  . Smokeless tobacco: Former Systems developer    Types: Chew  . Alcohol use Yes  . Drug use: No  . Sexual activity: Not on file

## 2016-04-05 NOTE — Transfer of Care (Signed)
Immediate Anesthesia Transfer of Care Note  Patient: [name]  Procedure(s) Performed: Procedure(s): C5-6, C6-7 Anterior Cervical Discectomy and Fusion, Allograft, Plate (N/A)  Patient Location: PACU  Anesthesia Type:General  Level of Consciousness: awake, alert  and patient cooperative  Airway & Oxygen Therapy: Patient Spontanous Breathing and Patient connected to nasal cannula oxygen  Post-op Assessment: Report given to RN, Post -op Vital signs reviewed and stable, Patient moving all extremities X 4 and Patient able to stick tongue midline  Post vital signs: Reviewed and stable  Last Vitals:  Vitals:   04/05/16 1111  BP: 135/68  Pulse: (!) 58  Resp: 18  Temp: 36.9 C    Last Pain: There were no vitals filed for this visit.       Complications: No apparent anesthesia complications

## 2016-04-05 NOTE — H&P (Signed)
[name] is an 66 y.o. male.   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 Progressively worsening symptoms. Failed conservative treatment.  Past Medical History:  Diagnosis Date  . Arthritis   . Diabetes mellitus without complication ([place] [facility])   . Diverticulitis of colon   . GERD (gastroesophageal reflux disease)   . Headache   . Hypertension   . Hypothyroidism   . Thyroid disease     Past Surgical History:  Procedure Laterality Date  . ANKLE FRACTURE SURGERY    . CARPAL TUNNEL RELEASE Bilateral   . 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 JOINT REPLACEMENT  11/10   right knee replaced   . KNEE SURGERY    . LEG SURGERY  11/03   broken lower right leg crushed     History reviewed. No pertinent family history. Social History:  reports that he has quit smoking. His smoking use included Cigarettes. He has quit using smokeless tobacco. His smokeless tobacco use included Chew. He reports that he drinks alcohol. He reports that he does not use drugs.  Allergies:  Allergies  Allergen Reactions  . Morphine Other (See Comments)    Severe headache    Medications Prior to Admission  Medication Sig Dispense Refill  . Aspirin-Acetaminophen-Caffeine (GOODY HEADACHE PO) Take 2 packets by mouth daily.    Marland Kitchen levothyroxine (SYNTHROID, LEVOTHROID) 100 MCG tablet Take 100 mcg by mouth daily before breakfast.    . losartan-hydrochlorothiazide (HYZAAR) 100-12.5 MG tablet Take 1 tablet by mouth daily after breakfast.    . metFORMIN (GLUCOPHAGE-XR) 500 MG 24 hr tablet Take 1,000 mg by mouth at bedtime.     . pantoprazole (PROTONIX) 20 MG tablet Take 20 mg by mouth daily after breakfast.      Results for orders placed or performed during the hospital encounter of 04/05/16 (from the past 48 hour(s))  Glucose, capillary     Status:  Abnormal   Collection Time: 04/05/16 11:15 AM  Result Value Ref Range   Glucose-Capillary 106 (H) 65 - 99 mg/dL   Dg Chest 2 View  Result Date: 04/05/2016 CLINICAL DATA:  Pre op for an ACDF C4-5, C5-6, C6-7 No chest complaints Hx of HTN- on meds, DM- on meds, ex-smoker x 8 years EXAM: CHEST  2 VIEW COMPARISON:  12/29/2009 FINDINGS: Mild hyperinflation. Moderate lower thoracic spondylosis. Midline trachea. Normal heart size. Atherosclerosis in the transverse aorta. No pleural effusion or pneumothorax. Clear lungs. IMPRESSION: Hyperinflation, without acute disease. Aortic atherosclerosis. Electronically Signed   By: Abigail Miyamoto M.D.   On: 04/05/2016 11:16    Review of Systems  Constitutional: Negative.   HENT: Negative.   Respiratory: Negative.   Cardiovascular: Negative.   Genitourinary: Negative.   Musculoskeletal: Positive for neck pain.  Neurological: Positive for tingling.  Psychiatric/Behavioral: Negative.     Blood pressure 135/68, pulse (!) 58, temperature 98.4 F (36.9 C), resp. rate 18, SpO2 100 %. Physical Exam  Constitutional: He is oriented to person, place, and time. He appears well-nourished. No distress.  HENT:  Head: Normocephalic and atraumatic.  Eyes: EOM are normal. Pupils are equal, round, and reactive to light.  Neck: Normal range of motion.  GI: He exhibits no distension.  Musculoskeletal:  Patient has 50% rotation. Bilateral radial plexus tenderness of the cervical spine. No supraclavicular lymphadenopathy. Negative shoulder impingement. Right wrist shows an  effusion tenderness with palpation. Carpal tunnel exam is normal. Negative Finkelstein test. Second through sixth extensor compartments are normal. Some subluxation at the first [facility] joint mildly tender negative grind test although there is crepitus. Good thenar and hyperthenar strength. No interosseous atrophy. Opposite left wrist shows full range of motion no tenderness. Right wrist has 50% flexion-extension  with pain. Pain with radial ulnar deviation.   Neurological: He is alert and oriented to person, place, and time.  Skin: Skin is warm and dry.  Psychiatric: He has a normal mood and affect.     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 Surgical procedure along with possible re-/recovery time discussed. All questions answered.  Benjiman Core, PA-C 04/05/2016, 12:21 PM

## 2016-04-05 NOTE — Interval H&P Note (Signed)
History and Physical Interval Note:  04/05/2016 1:03 PM  [name]  has presented today for surgery, with the diagnosis of C5-6, C6-7 Herniated Nucleus Pulposus, Spondylosis  The various methods of treatment have been discussed with the patient and family. After consideration of risks, benefits and other options for treatment, the patient has consented to  Procedure(s): C5-6, C6-7 Anterior Cervical Discectomy and Fusion, Allograft, Plate (N/A) as a surgical intervention .  The patient's history has been reviewed, patient examined, no change in status, stable for surgery.  I have reviewed the patient's chart and labs.  Questions were answered to the patient's satisfaction.     [name]

## 2016-04-05 NOTE — Anesthesia Procedure Notes (Signed)
Procedure Name: Intubation Date/Time: 04/05/2016 1:27 PM Performed by: Lyndle Herrlich Pre-anesthesia Checklist: Patient identified, Emergency Drugs available, Suction available and Patient being monitored Patient Re-evaluated:Patient Re-evaluated prior to inductionOxygen Delivery Method: Circle system utilized Preoxygenation: Pre-oxygenation with 100% oxygen Intubation Type: IV induction Ventilation: Mask ventilation with difficulty and Nasal airway inserted- appropriate to patient size Laryngoscope Size: Glidescope (T3) Grade View: Grade I Tube type: Oral Tube size: 7.0 mm Number of attempts: 1 Airway Equipment and Method: Video-laryngoscopy Placement Confirmation: ETT inserted through vocal cords under direct vision,  positive ETCO2 and breath sounds checked- equal and bilateral Secured at: 23 cm Tube secured with: Tape Dental Injury: Teeth and Oropharynx as per pre-operative assessment  Difficulty Due To: Difficulty was anticipated

## 2016-04-06 DIAGNOSIS — M4802 Spinal stenosis, cervical region: Secondary | ICD-10-CM

## 2016-04-06 LAB — GLUCOSE, CAPILLARY
GLUCOSE-CAPILLARY: 165 mg/dL — AB (ref 65–99)
Glucose-Capillary: 164 mg/dL — ABNORMAL HIGH (ref 65–99)
Glucose-Capillary: 126 mg/dL — ABNORMAL HIGH (ref 65–99)
Glucose-Capillary: 128 mg/dL — ABNORMAL HIGH (ref 65–99)

## 2016-04-06 NOTE — Progress Notes (Signed)
Pt held down food and is ready to be discharge states feels much better

## 2016-04-06 NOTE — Progress Notes (Signed)
Reviewed discharge papers and medications with out any problems

## 2016-04-06 NOTE — Progress Notes (Signed)
Called Dr Lorin Mercy regarding Mr [name] and Vomitting, states go back to clear liquid diet and continue zofran 4mg  IV

## 2016-04-06 NOTE — Progress Notes (Signed)
Reviewed discharge papers and medications with full understanding 

## 2016-04-06 NOTE — Anesthesia Postprocedure Evaluation (Signed)
Anesthesia Post Note  Patient: Dulcy Fanny  Procedure(s) Performed: Procedure(s) (LRB): C5-6, C6-7 Anterior Cervical Discectomy and Fusion, Allograft, Plate (N/A)  Patient location during evaluation: PACU Anesthesia Type: General Level of consciousness: awake and alert Pain management: pain level controlled Vital Signs Assessment: post-procedure vital signs reviewed and stable Respiratory status: spontaneous breathing, nonlabored ventilation, respiratory function stable and patient connected to nasal cannula oxygen Cardiovascular status: blood pressure returned to baseline and stable Postop Assessment: no signs of nausea or vomiting Anesthetic complications: no        Last Vitals:  Vitals:   04/05/16 2103 04/06/16 0412  BP: (!) 151/81 138/86  Pulse: 72 67  Resp: 16 20  Temp: 36.6 C 36.5 C    Last Pain:  Vitals:   04/06/16 0711  TempSrc:   PainSc: 8    Pain Goal: Patients Stated Pain Goal: 3 (04/06/16 3953)               Riccardo Dubin

## 2016-04-06 NOTE — Progress Notes (Signed)
   Subjective: 1 Day Post-Op Procedure(s) (LRB): C5-6, C6-7 Anterior Cervical Discectomy and Fusion, Allograft, Plate (N/A) Patient reports pain as mild.    Objective: Vital signs in last 24 hours: Temp:  [97 F (36.1 C)-98.4 F (36.9 C)] 97.7 F (36.5 C) (03/29 0412) Pulse Rate:  [58-80] 67 (03/29 0412) Resp:  [15-20] 20 (03/29 0412) BP: (135-157)/(68-96) 138/86 (03/29 0412) SpO2:  [94 %-100 %] 96 % (03/29 0412) Weight:  [198 lb (89.8 kg)] 198 lb (89.8 kg) (03/28 1842)  Intake/Output from previous day: 03/28 0701 - 03/29 0700 In: 3415 [P.O.:240; I.V.:3175] Out: 220 [Urine:100; Drains:20; Blood:100] Intake/Output this shift: No intake/output data recorded.  No results for input(s): HGB in the last 72 hours. No results for input(s): WBC, RBC, HCT, PLT in the last 72 hours. No results for input(s): NA, K, CL, CO2, BUN, CREATININE, GLUCOSE, CALCIUM in the last 72 hours. No results for input(s): LABPT, INR in the last 72 hours.  Neurologically intact Dg Chest 2 View  Result Date: 04/05/2016 CLINICAL DATA:  Pre op for an ACDF C4-5, C5-6, C6-7 No chest complaints Hx of HTN- on meds, DM- on meds, ex-smoker x 8 years EXAM: CHEST  2 VIEW COMPARISON:  12/29/2009 FINDINGS: Mild hyperinflation. Moderate lower thoracic spondylosis. Midline trachea. Normal heart size. Atherosclerosis in the transverse aorta. No pleural effusion or pneumothorax. Clear lungs. IMPRESSION: Hyperinflation, without acute disease. Aortic atherosclerosis. Electronically Signed   By: Abigail Miyamoto M.D.   On: 04/05/2016 11:16   Dg Cervical Spine 2-3 Views  Result Date: 04/05/2016 CLINICAL DATA:  Cervical fusion. EXAM: DG C-ARM 61-120 MIN; CERVICAL SPINE - 2-3 VIEW COMPARISON:  MRI 12/18/2015 FINDINGS: Anterior plate and screws and interbody bone plugs noted at C5-6 and C6-7. No complicating features are identified. IMPRESSION: Cervical fusion at C5-6 and C6-7. Electronically Signed   By: Marijo Sanes M.D.   On:  04/05/2016 16:09   Dg C-arm 1-60 Min  Result Date: 04/05/2016 CLINICAL DATA:  Cervical fusion. EXAM: DG C-ARM 61-120 MIN; CERVICAL SPINE - 2-3 VIEW COMPARISON:  MRI 12/18/2015 FINDINGS: Anterior plate and screws and interbody bone plugs noted at C5-6 and C6-7. No complicating features are identified. IMPRESSION: Cervical fusion at C5-6 and C6-7. Electronically Signed   By: Marijo Sanes M.D.   On: 04/05/2016 16:09    Assessment/Plan: 1 Day Post-Op Procedure(s) (LRB): C5-6, C6-7 Anterior Cervical Discectomy and Fusion, Allograft, Plate (N/A) Discharge home  [name] 04/06/2016, 7:46 AM

## 2016-04-06 NOTE — Progress Notes (Signed)
Patient ID: [name], male   DOB: Jul 11, 1950, 66 y.o.   MRN: [id number] Nurse called , pt having N and V and could not keep down breakfast. Ambulatory .  If N and V resolve he can be discharged today.

## 2016-04-10 ENCOUNTER — Encounter (HOSPITAL_COMMUNITY): Payer: Self-pay | Admitting: Orthopaedic Surgery

## 2016-04-11 NOTE — Discharge Summary (Signed)
Patient ID: JOSUA FERREBEE MRN: [id number] DOB/AGE: 64-Jul-1952 64 y.o.  Admit date: 04/05/2016 Discharge date: 04/06/2016 Admission Diagnoses:  Active Problems:   Cervical spinal stenosis   Discharge Diagnoses:  Active Problems:   Cervical spinal stenosis  status post Procedure(s): C5-6, C6-7 Anterior Cervical Discectomy and Fusion, Allograft, Plate  Past Medical History:  Diagnosis Date  . Arthritis   . Diabetes mellitus without complication ([place])   . Diverticulitis of colon   . GERD (gastroesophageal reflux disease)   . Headache   . Hypertension   . Hypothyroidism   . Thyroid disease     Surgeries: Procedure(s): C5-6, C6-7 Anterior Cervical Discectomy and Fusion, Allograft, Plate on 6/43/3295   Consultants:   Discharged Condition: Improved  Hospital Course: VINH SACHS is an 66 y.o. male who was admitted 04/05/2016 for operative treatment of <principal problem not specified>. Patient failed conservative treatments (please see the history and physical for the specifics) and had severe unremitting pain that affects sleep, daily activities and work/hobbies. After pre-op clearance, the patient was taken to the operating room on 04/05/2016 and underwent  Procedure(s): C5-6, C6-7 Anterior Cervical Discectomy and Fusion, Allograft, Plate.    Patient was given perioperative antibiotics:  Anti-infectives    Start     Dose/Rate Route Frequency Ordered Stop   04/05/16 0642  ceFAZolin (ANCEF) IVPB 2g/100 mL premix     2 g 200 mL/hr over 30 Minutes Intravenous On call to O.R. 04/05/16 1884 04/05/16 1330       Patient was given sequential compression devices and early ambulation to prevent DVT.   Patient benefited maximally from hospital stay and there were no complications. At the time of discharge, the patient was urinating/moving their bowels without difficulty, tolerating a regular diet, pain is controlled with oral pain medications and they have been cleared by  PT/OT.   Recent vital signs: No data found.    Recent laboratory studies: No results for input(s): WBC, HGB, HCT, PLT, NA, K, CL, CO2, BUN, CREATININE, GLUCOSE, INR, CALCIUM in the last 72 hours.  Invalid input(s): PT, 2   Discharge Medications:   Allergies as of 04/06/2016      Reactions   Morphine Other (See Comments)   Severe headache      Medication List    TAKE these medications   GOODY HEADACHE PO Take 2 packets by mouth daily.   levothyroxine 100 MCG tablet Commonly known as:  SYNTHROID, LEVOTHROID Take 100 mcg by mouth daily before breakfast.   losartan-hydrochlorothiazide 100-12.5 MG tablet Commonly known as:  HYZAAR Take 1 tablet by mouth daily after breakfast.   metFORMIN 500 MG 24 hr tablet Commonly known as:  GLUCOPHAGE-XR Take 1,000 mg by mouth at bedtime.   methocarbamol 500 MG tablet Commonly known as:  ROBAXIN Take 1 tablet (500 mg total) by mouth every 6 (six) hours as needed for muscle spasms.   oxyCODONE-acetaminophen 5-325 MG tablet Commonly known as:  ROXICET Take 1-2 tablets by mouth every 6 (six) hours as needed for severe pain.   pantoprazole 20 MG tablet Commonly known as:  PROTONIX Take 20 mg by mouth daily after breakfast.       Diagnostic Studies: Dg Chest 2 View  Result Date: 04/05/2016 CLINICAL DATA:  Pre op for an ACDF C4-5, C5-6, C6-7 No chest complaints Hx of HTN- on meds, DM- on meds, ex-smoker x 8 years EXAM: CHEST  2 VIEW COMPARISON:  12/29/2009 FINDINGS: Mild hyperinflation. Moderate lower thoracic spondylosis. Midline trachea.  Normal heart size. Atherosclerosis in the transverse aorta. No pleural effusion or pneumothorax. Clear lungs. IMPRESSION: Hyperinflation, without acute disease. Aortic atherosclerosis. Electronically Signed   By: Abigail Miyamoto M.D.   On: 04/05/2016 11:16   Dg Cervical Spine 2-3 Views  Result Date: 04/05/2016 CLINICAL DATA:  Cervical fusion. EXAM: DG C-ARM 61-120 MIN; CERVICAL SPINE - 2-3 VIEW  COMPARISON:  MRI 12/18/2015 FINDINGS: Anterior plate and screws and interbody bone plugs noted at C5-6 and C6-7. No complicating features are identified. IMPRESSION: Cervical fusion at C5-6 and C6-7. Electronically Signed   By: Marijo Sanes M.D.   On: 04/05/2016 16:09   Dg C-arm 1-60 Min  Result Date: 04/05/2016 CLINICAL DATA:  Cervical fusion. EXAM: DG C-ARM 61-120 MIN; CERVICAL SPINE - 2-3 VIEW COMPARISON:  MRI 12/18/2015 FINDINGS: Anterior plate and screws and interbody bone plugs noted at C5-6 and C6-7. No complicating features are identified. IMPRESSION: Cervical fusion at C5-6 and C6-7. Electronically Signed   By: Marijo Sanes M.D.   On: 04/05/2016 16:09      Follow-up Information    Marybelle Killings, MD Follow up in 1 week(s).   Specialty:  Orthopedic Surgery Contact information: [place] [address] [phone]           Discharge Plan:  discharge to home  Disposition:     Signed: Benjiman Core 04/11/2016, 12:01 PM

## 2016-04-12 ENCOUNTER — Telehealth (INDEPENDENT_AMBULATORY_CARE_PROVIDER_SITE_OTHER): Payer: Self-pay

## 2016-04-12 ENCOUNTER — Encounter (INDEPENDENT_AMBULATORY_CARE_PROVIDER_SITE_OTHER): Payer: Self-pay | Admitting: Orthopaedic Surgery

## 2016-04-12 ENCOUNTER — Ambulatory Visit (INDEPENDENT_AMBULATORY_CARE_PROVIDER_SITE_OTHER): Payer: 59 | Admitting: Orthopaedic Surgery

## 2016-04-12 ENCOUNTER — Ambulatory Visit (INDEPENDENT_AMBULATORY_CARE_PROVIDER_SITE_OTHER): Payer: 59

## 2016-04-12 VITALS — BP 119/82 | HR 65 | Ht 69.0 in | Wt 195.0 lb

## 2016-04-12 DIAGNOSIS — M4722 Other spondylosis with radiculopathy, cervical region: Secondary | ICD-10-CM

## 2016-04-12 NOTE — Progress Notes (Signed)
   Post-Op Visit Note   Patient: [name]           Date of Birth: 10-20-1950           MRN: [id number] Visit Date: 04/12/2016 PCP: Beatris Si   Assessment & Plan: Postop two-level cervical fusion C5-6 C6-7  Chief Complaint:  Chief Complaint  Patient presents with  . Neck - Routine Post Op   Visit Diagnoses:  1. Other spondylosis with radiculopathy, cervical region     Plan: Return 5 weeks for follow removal in flexion-extension C-spine x-rays lateral and also AP x-ray  Follow-Up Instructions: No Follow-up on file.   Orders:  Orders Placed This Encounter  Procedures  . XR Cervical Spine 2 or 3 views   No orders of the defined types were placed in this encounter.  HPI:   Patient returns s/p C5-6 and C6-7 anterior cervical discectomy and fusion, with allograft and plate, done on 04/05/16.  He is feeling well, still having some difficulty swallowing.  He is still eating soft foods.  He is wearing the soft cervical collar.  Still has numbness in the left fingers, but states he has no actual pain.  He is taking oxycodone and robaxin QHS only, to aid with sleep.  He is not sleeping all night.    Imaging: Xr Cervical Spine 2 Or 3 Views  Result Date: 04/12/2016 AP lateral cervical x-rays obtained which shows the postop C5-6 C6-7 anterior cervical discectomy and fusion. Grafts are in place plate and screws are in satisfactory position. He has some the soft tissue swelling anterior to the operative site as expected. Impression: Satisfactory postop two-level cervical fusion C5-6 C6-7.   PMFS History: Patient Active Problem List   Diagnosis Date Noted  . Cervical spinal stenosis 04/05/2016  . Pain in right wrist 03/07/2016  . Neck pain 12/01/2015  . Other spondylosis with radiculopathy, cervical region 12/01/2015  . Diverticulitis of colon with perforation 10/20/2010   Past Medical History:  Diagnosis Date  . Arthritis   . Diabetes mellitus without complication  ([place])   . Diverticulitis of colon   . GERD (gastroesophageal reflux disease)   . Headache   . Hypertension   . Hypothyroidism   . Thyroid disease     No family history on file.  Past Surgical History:  Procedure Laterality Date  . ANKLE FRACTURE SURGERY    . ANTERIOR CERVICAL DECOMP/DISCECTOMY FUSION N/A 04/05/2016   Procedure: C5-6, C6-7 Anterior Cervical Discectomy and Fusion, Allograft, Plate;  Surgeon: Marybelle Killings, MD;  Location: [facility];  Service: Orthopedics;  Laterality: N/A;  . ANTERIOR FUSION CERVICAL SPINE  04/05/2016   c6 c7  . CARPAL TUNNEL RELEASE Bilateral   . 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 JOINT REPLACEMENT  11/10   right knee replaced   . KNEE SURGERY    . LEG SURGERY  11/03   broken lower right leg crushed    Social History   Occupational History  . Not on file.   Social History Main Topics  . Smoking status: Former Smoker    Types: Cigarettes    Quit date: 01/10/2008  . Smokeless tobacco: Former Systems developer    Types: Chew  . Alcohol use Yes     Comment: occasionally  . Drug use: No  . Sexual activity: Not on file

## 2016-04-12 NOTE — Telephone Encounter (Signed)
Received vm from Tory w/[employer] needing to confirm if pt had surgery on 3/28 and when his follow up appt was. Release is on file. I called her back and adv yes had surgery and f/u appt is today.

## 2016-04-24 ENCOUNTER — Telehealth (INDEPENDENT_AMBULATORY_CARE_PROVIDER_SITE_OTHER): Payer: Self-pay | Admitting: *Deleted

## 2016-04-24 NOTE — Telephone Encounter (Signed)
Pt called asking if he needs to continue to wrap collar when taking a shower. Pt stated the Steri strips are no longer on and pt has band aids on now.

## 2016-04-24 NOTE — Telephone Encounter (Signed)
I called and spoke with patient. His main question was whether he had to wear the collar in the shower. I explained to him, yes, and that he needs to continue wearing the collar at all times until his next follow up visit. He will have flexion/extension films at that visit and it will be determined then if he can discontinue the collar. Patient verbalized understanding.

## 2016-05-09 ENCOUNTER — Other Ambulatory Visit (INDEPENDENT_AMBULATORY_CARE_PROVIDER_SITE_OTHER): Payer: Self-pay | Admitting: Surgery

## 2016-05-09 NOTE — Telephone Encounter (Signed)
I called and discussed. OK to stop robaxin now. FYI still some numbness in ulnar innervated fingers. Will check on ROV 5/15. Neck pain good. FYI

## 2016-05-09 NOTE — Telephone Encounter (Signed)
[facility] patient

## 2016-05-09 NOTE — Telephone Encounter (Signed)
Ok for refill? 

## 2016-05-23 ENCOUNTER — Ambulatory Visit (INDEPENDENT_AMBULATORY_CARE_PROVIDER_SITE_OTHER): Payer: 59

## 2016-05-23 ENCOUNTER — Encounter (INDEPENDENT_AMBULATORY_CARE_PROVIDER_SITE_OTHER): Payer: Self-pay | Admitting: Orthopaedic Surgery

## 2016-05-23 ENCOUNTER — Ambulatory Visit (INDEPENDENT_AMBULATORY_CARE_PROVIDER_SITE_OTHER): Payer: 59 | Admitting: Orthopaedic Surgery

## 2016-05-23 DIAGNOSIS — Z981 Arthrodesis status: Secondary | ICD-10-CM

## 2016-05-23 NOTE — Progress Notes (Signed)
   Post-Op Visit Note   Patient: [name]           Date of Birth: 1950/06/06           MRN: [id number] Visit Date: 05/23/2016 PCP: Corine Shelter, PA-C   Assessment & Plan:  Chief Complaint:  Chief Complaint  Patient presents with  . Neck - Routine Post Op   Visit Diagnoses:  1. Status post cervical spinal fusion     Plan: Patient doing well. Can discontinue cervical collar. Posterior fracture. Can wean out of this if he needs to if he has some soreness. Must avoid heavy or excessive lifting overhead. Follow-up in 5 weeks for recheck. Continue out of work.   Follow-Up Instructions: Return in about 5 weeks (around 06/27/2016).   Orders:  Orders Placed This Encounter  Procedures  . XR Cervical Spine 2 or 3 views   No orders of the defined types were placed in this encounter.   Imaging: Xr Cervical Spine 2 Or 3 Views  Result Date: 05/23/2016 X-ray cervical spine do not show any motion at C5-6 or C6-7. Hardware intact. Fusion is not yet solid.   PMFS History: Patient Active Problem List   Diagnosis Date Noted  . Status post cervical spinal fusion 05/23/2016  . Cervical spinal stenosis 04/05/2016  . Pain in right wrist 03/07/2016  . Neck pain 12/01/2015  . Other spondylosis with radiculopathy, cervical region 12/01/2015  . Diverticulitis of colon with perforation 10/20/2010   Past Medical History:  Diagnosis Date  . Arthritis   . Diabetes mellitus without complication ([place])   . Diverticulitis of colon   . GERD (gastroesophageal reflux disease)   . Headache   . Hypertension   . Hypothyroidism   . Thyroid disease     No family history on file.  Past Surgical History:  Procedure Laterality Date  . ANKLE FRACTURE SURGERY    . ANTERIOR CERVICAL DECOMP/DISCECTOMY FUSION N/A 04/05/2016   Procedure: C5-6, C6-7 Anterior Cervical Discectomy and Fusion, Allograft, Plate;  Surgeon: Marybelle Killings, MD;  Location: [place];  Service: Orthopedics;  Laterality: N/A;  .  ANTERIOR FUSION CERVICAL SPINE  04/05/2016   c6 c7  . CARPAL TUNNEL RELEASE Bilateral   . 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 JOINT REPLACEMENT  11/10   right knee replaced   . KNEE SURGERY    . LEG SURGERY  11/03   broken lower right leg crushed    Social History   Occupational History  . Not on file.   Social History Main Topics  . Smoking status: Former Smoker    Types: Cigarettes    Quit date: 01/10/2008  . Smokeless tobacco: Former Systems developer    Types: Chew  . Alcohol use Yes     Comment: occasionally  . Drug use: No  . Sexual activity: Not on file   Exam Surgical incision well-healed. Left elbow positive Tinel's over the cubital tunnel. Positive left elbow flexion test. Right elbow unremarkable. Negative Tinel's bilateral wrists.

## 2016-05-26 ENCOUNTER — Telehealth (INDEPENDENT_AMBULATORY_CARE_PROVIDER_SITE_OTHER): Payer: Self-pay | Admitting: Orthopaedic Surgery

## 2016-05-26 NOTE — Telephone Encounter (Signed)
RECORDS 04/09/2016-PRESENT FAXED TO [facility] [phone]

## 2016-06-01 ENCOUNTER — Telehealth (INDEPENDENT_AMBULATORY_CARE_PROVIDER_SITE_OTHER): Payer: Self-pay | Admitting: Orthopaedic Surgery

## 2016-06-01 NOTE — Telephone Encounter (Signed)
Patient called checking if records had been sent to [facility] as he had received a letter from them. I advised him that records were faxed 5/18 and that the letter most likley went out before records were faxed.  He voiced understanding.

## 2016-06-15 ENCOUNTER — Ambulatory Visit: Payer: 59 | Admitting: Internal Medicine

## 2016-06-22 ENCOUNTER — Other Ambulatory Visit (INDEPENDENT_AMBULATORY_CARE_PROVIDER_SITE_OTHER): Payer: 59

## 2016-06-22 ENCOUNTER — Encounter: Payer: Self-pay | Admitting: Internal Medicine

## 2016-06-22 ENCOUNTER — Telehealth: Payer: Self-pay | Admitting: Internal Medicine

## 2016-06-22 ENCOUNTER — Ambulatory Visit (INDEPENDENT_AMBULATORY_CARE_PROVIDER_SITE_OTHER): Payer: 59 | Admitting: Internal Medicine

## 2016-06-22 DIAGNOSIS — K219 Gastro-esophageal reflux disease without esophagitis: Secondary | ICD-10-CM | POA: Diagnosis not present

## 2016-06-22 DIAGNOSIS — E1169 Type 2 diabetes mellitus with other specified complication: Secondary | ICD-10-CM | POA: Insufficient documentation

## 2016-06-22 DIAGNOSIS — E039 Hypothyroidism, unspecified: Secondary | ICD-10-CM | POA: Insufficient documentation

## 2016-06-22 DIAGNOSIS — E785 Hyperlipidemia, unspecified: Secondary | ICD-10-CM | POA: Insufficient documentation

## 2016-06-22 DIAGNOSIS — E119 Type 2 diabetes mellitus without complications: Secondary | ICD-10-CM

## 2016-06-22 DIAGNOSIS — E118 Type 2 diabetes mellitus with unspecified complications: Secondary | ICD-10-CM | POA: Insufficient documentation

## 2016-06-22 DIAGNOSIS — E038 Other specified hypothyroidism: Secondary | ICD-10-CM | POA: Diagnosis not present

## 2016-06-22 DIAGNOSIS — E1122 Type 2 diabetes mellitus with diabetic chronic kidney disease: Secondary | ICD-10-CM | POA: Insufficient documentation

## 2016-06-22 DIAGNOSIS — E7800 Pure hypercholesterolemia, unspecified: Secondary | ICD-10-CM

## 2016-06-22 LAB — TSH: TSH: 0.82 u[IU]/mL (ref 0.35–4.50)

## 2016-06-22 LAB — CBC WITH DIFFERENTIAL/PLATELET
BASOS ABS: 0.1 10*3/uL (ref 0.0–0.1)
Basophils Relative: 1.4 % (ref 0.0–3.0)
EOS ABS: 1 10*3/uL — AB (ref 0.0–0.7)
Eosinophils Relative: 12.6 % — ABNORMAL HIGH (ref 0.0–5.0)
HCT: 41.7 % (ref 39.0–52.0)
HEMOGLOBIN: 13.9 g/dL (ref 13.0–17.0)
LYMPHS PCT: 24.2 % (ref 12.0–46.0)
Lymphs Abs: 1.9 10*3/uL (ref 0.7–4.0)
MCHC: 33.2 g/dL (ref 30.0–36.0)
MCV: 88.4 fl (ref 78.0–100.0)
MONO ABS: 0.8 10*3/uL (ref 0.1–1.0)
Monocytes Relative: 10.1 % (ref 3.0–12.0)
Neutro Abs: 4.1 10*3/uL (ref 1.4–7.7)
Neutrophils Relative %: 51.7 % (ref 43.0–77.0)
Platelets: 310 10*3/uL (ref 150.0–400.0)
RBC: 4.72 Mil/uL (ref 4.22–5.81)
RDW: 14.9 % (ref 11.5–15.5)
WBC: 7.9 10*3/uL (ref 4.0–10.5)

## 2016-06-22 LAB — COMPREHENSIVE METABOLIC PANEL
ALBUMIN: 4.3 g/dL (ref 3.5–5.2)
ALT: 13 U/L (ref 0–53)
AST: 17 U/L (ref 0–37)
Alkaline Phosphatase: 65 U/L (ref 39–117)
BILIRUBIN TOTAL: 0.6 mg/dL (ref 0.2–1.2)
BUN: 20 mg/dL (ref 6–23)
CHLORIDE: 102 meq/L (ref 96–112)
CO2: 29 mEq/L (ref 19–32)
CREATININE: 1.11 mg/dL (ref 0.40–1.50)
Calcium: 10.4 mg/dL (ref 8.4–10.5)
GFR: 70.97 mL/min (ref >60.00)
Glucose, Bld: 113 mg/dL — ABNORMAL HIGH (ref 70–99)
Potassium: 4.4 mEq/L (ref 3.5–5.1)
SODIUM: 139 meq/L (ref 135–145)
TOTAL PROTEIN: 7.2 g/dL (ref 6.0–8.3)

## 2016-06-22 LAB — LIPID PANEL
Cholesterol: 156 mg/dL (ref 0–200)
HDL: 40.7 mg/dL (ref >39.00)
LDL Cholesterol: 92 mg/dL (ref 0–99)
NONHDL: 115.43
Total CHOL/HDL Ratio: 4
Triglycerides: 115 mg/dL (ref 0.0–149.0)
VLDL: 23 mg/dL (ref 0.0–40.0)

## 2016-06-22 LAB — HEMOGLOBIN A1C: HEMOGLOBIN A1C: 6.6 % — AB (ref 4.6–6.5)

## 2016-06-22 MED ORDER — LOSARTAN POTASSIUM-HCTZ 100-12.5 MG PO TABS: 1.0000 | ORAL_TABLET | Freq: Every day | ORAL | 3 refills | Status: DC

## 2016-06-22 MED ORDER — LEVOTHYROXINE SODIUM 100 MCG PO TABS: 100.0000 ug | ORAL_TABLET | Freq: Every day | ORAL | 3 refills | Status: DC

## 2016-06-22 MED ORDER — PANTOPRAZOLE SODIUM 20 MG PO TBEC: 20.0000 mg | DELAYED_RELEASE_TABLET | Freq: Every day | ORAL | 3 refills | Status: DC

## 2016-06-22 MED ORDER — METFORMIN HCL ER 500 MG PO TB24: 1000.0000 mg | ORAL_TABLET | Freq: Every day | ORAL | 3 refills | Status: DC

## 2016-06-22 NOTE — Assessment & Plan Note (Signed)
Check A1c Continue diabetic diet Continue regular exercise Continue metformin-we'll adjust if needed

## 2016-06-22 NOTE — Telephone Encounter (Signed)
ROI fax to [facility] @ [place]

## 2016-06-22 NOTE — Progress Notes (Signed)
Subjective:    Patient ID: [name], male    DOB: 1950/11/13, 66 y.o.   MRN: [id number]  HPI He is here to establish with a new pcp.    Diabetes: He is taking his medication daily as prescribed. He is compliant with a diabetic diet. He is exercising regularly - walking.   He is up-to-date with an ophthalmology examination.   Hypothyroidism:  He is taking his medication daily.  He denies any recent changes in energy or weight that are unexplained.   GERD:  He is taking his medication daily as prescribed.  He denies any GERD symptoms and feels his GERD is well controlled.    Medications and allergies reviewed with patient and updated if appropriate.  Patient Active Problem List   Diagnosis Date Noted  . Diabetes ([place]) 06/22/2016  . Hypothyroidism 06/22/2016  . Hyperlipidemia 06/22/2016  . Status post cervical spinal fusion 05/23/2016  . Cervical spinal stenosis 04/05/2016  . Pain in right wrist 03/07/2016  . Neck pain 12/01/2015  . Other spondylosis with radiculopathy, cervical region 12/01/2015  . Diverticulitis of colon with perforation 10/20/2010  . Diverticulitis of large intestine with perforation and abscess without bleeding 10/20/2010    Current Outpatient Prescriptions on File Prior to Visit  Medication Sig Dispense Refill  . Aspirin-Acetaminophen-Caffeine (GOODY HEADACHE PO) Take 2 packets by mouth daily.    Marland Kitchen levothyroxine (SYNTHROID, LEVOTHROID) 100 MCG tablet Take 100 mcg by mouth daily before breakfast.    . losartan-hydrochlorothiazide (HYZAAR) 100-12.5 MG tablet Take 1 tablet by mouth daily after breakfast.    . metFORMIN (GLUCOPHAGE-XR) 500 MG 24 hr tablet Take 1,000 mg by mouth at bedtime.     . pantoprazole (PROTONIX) 20 MG tablet Take 20 mg by mouth daily after breakfast.     No current facility-administered medications on file prior to visit.     Past Medical History:  Diagnosis Date  . Arthritis   . Diabetes mellitus without complication  ([place])   . Diverticulitis of colon   . GERD (gastroesophageal reflux disease)   . Headache   . Hypertension   . Hypothyroidism   . Thyroid disease     Past Surgical History:  Procedure Laterality Date  . ANKLE FRACTURE SURGERY    . ANTERIOR CERVICAL DECOMP/DISCECTOMY FUSION N/A 04/05/2016   Procedure: C5-6, C6-7 Anterior Cervical Discectomy and Fusion, Allograft, Plate;  Surgeon: Marybelle Killings, MD;  Location: [place];  Service: Orthopedics;  Laterality: N/A;  . ANTERIOR FUSION CERVICAL SPINE  04/05/2016   c6 c7  . CARPAL TUNNEL RELEASE Bilateral   . 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 JOINT REPLACEMENT  11/10   right knee replaced   . KNEE SURGERY    . LEG SURGERY  11/03   broken lower right leg crushed     Social History   Social History  . Marital status: Divorced    Spouse name: N/A  . Number of children: N/A  . Years of education: N/A   Social History Main Topics  . Smoking status: Former Smoker    Types: Cigarettes    Quit date: 01/10/2008  . Smokeless tobacco: Former Systems developer    Types: Chew  . Alcohol use Yes     Comment: occasionally  . Drug use: No  . Sexual activity: Not Asked   Other Topics Concern  . None   Social History Narrative  . None    History reviewed. No pertinent  family history.  Review of Systems  Constitutional: Positive for fatigue (at times). Negative for chills and fever.  Eyes: Negative for visual disturbance.  Respiratory: Negative for cough, shortness of breath and wheezing.   Cardiovascular: Positive for chest pain (with overexertion only, goes away with rest - ocurred 1-2 times only - had stress test - dobutamine - ? how long ago). Negative for palpitations and leg swelling.  Gastrointestinal: Negative for abdominal pain, blood in stool, constipation, diarrhea and nausea.       Gerd controlled  Genitourinary: Negative for dysuria and hematuria.  Musculoskeletal: Positive for neck pain and neck stiffness.  Neurological:  Positive for headaches (occ migraine). Negative for dizziness and light-headedness.       Objective:   Vitals:   06/22/16 0931  BP: 120/78  Pulse: 69  Resp: 16  Temp: 98.3 F (36.8 C)   Filed Weights   06/22/16 0931  Weight: 184 lb 12.8 oz (83.8 kg)   Body mass index is 27.29 kg/m.  Wt Readings from Last 3 Encounters:  06/22/16 184 lb 12.8 oz (83.8 kg)  05/23/16 195 lb (88.5 kg)  04/12/16 195 lb (88.5 kg)     Physical Exam Constitutional: He appears well-developed and well-nourished. No distress.  HENT:  Head: Normocephalic and atraumatic.  Right Ear: External ear normal.  Left Ear: External ear normal.  Mouth/Throat: Oropharynx is clear and moist.  Normal ear canals and TM b/l  Eyes: Conjunctivae and EOM are normal.  Neck: Neck supple. No tracheal deviation present. No thyromegaly present.  No carotid bruit  Cardiovascular: Normal rate, regular rhythm, normal heart sounds and intact distal pulses.   No murmur heard. Pulmonary/Chest: Effort normal and breath sounds normal. No respiratory distress. He has no wheezes. He has no rales.  Abdominal: Soft. Bowel sounds are normal. He exhibits no distension. There is no tenderness.  Musculoskeletal: He exhibits no edema.  Lymphadenopathy:    He has no cervical adenopathy.  Skin: Skin is warm and dry. He is not diaphoretic.  Psychiatric: He has a normal mood and affect. His behavior is normal.       Assessment & Plan:   See Problem List for Assessment and Plan of chronic medical problems.

## 2016-06-22 NOTE — Assessment & Plan Note (Signed)
GERD controlled Continue daily medication  

## 2016-06-22 NOTE — Patient Instructions (Addendum)
  Test(s) ordered today. Your results will be released to [facility] (or called to you) after review, usually within 72hours after test completion. If any changes need to be made, you will be notified at that same time.   Medications reviewed and updated.  No changes recommended at this time.  Your prescription(s) have been given to you. Please take as directed and contact our office if you believe you are having problem(s) with the medication(s).   Please followup in 6 months

## 2016-06-22 NOTE — Assessment & Plan Note (Signed)
Has not been on any medication He is unsure of his cholesterol is elevated or not, but has hyperlipidemia and is problem list Check lipid panel

## 2016-06-22 NOTE — Assessment & Plan Note (Signed)
Check tsh  Titrate med dose if needed  

## 2016-06-28 ENCOUNTER — Ambulatory Visit (INDEPENDENT_AMBULATORY_CARE_PROVIDER_SITE_OTHER): Payer: 59

## 2016-06-28 ENCOUNTER — Ambulatory Visit (INDEPENDENT_AMBULATORY_CARE_PROVIDER_SITE_OTHER): Payer: 59 | Admitting: Orthopaedic Surgery

## 2016-06-28 ENCOUNTER — Encounter (INDEPENDENT_AMBULATORY_CARE_PROVIDER_SITE_OTHER): Payer: Self-pay | Admitting: Orthopaedic Surgery

## 2016-06-28 VITALS — BP 118/73 | HR 68 | Ht 69.0 in | Wt 195.0 lb

## 2016-06-28 DIAGNOSIS — Z981 Arthrodesis status: Secondary | ICD-10-CM

## 2016-06-28 MED ORDER — METHOCARBAMOL 500 MG PO TABS: 500.0000 mg | ORAL_TABLET | Freq: Three times a day (TID) | ORAL | 0 refills | Status: DC | PRN

## 2016-06-28 NOTE — Progress Notes (Signed)
Post-Op Visit Note   Patient: [name]           Date of Birth: [birth year]           MRN: [id number] Visit Date: 06/28/2016 PCP: Binnie Rail, MD   Assessment & Plan: Follow-up C5-6, C6-7 two-level cervical fusion. He still M discomfort in his the base of his neck some limitation in range of motion of cervical spine and persistent pain that radiates with some numbness and left hand primarily ring and small finger.  Chief Complaint:  Chief Complaint  Patient presents with  . Neck - Follow-up   Visit Diagnoses:  1. Status post cervical spinal fusion     Plan:Upper lobe was not healed distal and persistent problems with his ulnar innervated sensation in ulnar motor and his left hand with some interosseous weakness some weakness in grip and also some weakness in his triceps he can gradually begin working out again some with his arms. 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 Work slip given no work 1 month recheck 1 month.  Follow-Up Instructions: No Follow-up on file.   Orders:  Orders Placed This Encounter  Procedures  . XR Cervical Spine 2 or 3 views   No orders of the defined types were placed in this encounter.   Imaging: Xr Cervical Spine 2 Or 3 Views  Result Date: 06/28/2016 Lateral flexion-extension x-ray obtained in AP x-ray cervical spine and reviewed. This shows no evidence of screw loosening but he appears to have some motion on flexion-extension with The spinous processes posteriorly at the C5-6 level. C6-7 looked solid. Partial incorporation of the graft at the C5-6 level. Impression: Solid C6-7 fusion. There appears to be some delayed in healing at the C5-6 level.   PMFS History: Patient Active Problem List   Diagnosis Date Noted  . Diabetes ([place]) 06/22/2016  . Hypothyroidism 06/22/2016  . Hyperlipidemia 06/22/2016  . GERD (gastroesophageal reflux disease) 06/22/2016  . Status post cervical spinal fusion 05/23/2016  . Cervical spinal  stenosis 04/05/2016  . Pain in right wrist 03/07/2016  . Neck pain 12/01/2015  . Other spondylosis with radiculopathy, cervical region 12/01/2015  . Diverticulitis of colon with perforation 10/20/2010  . Diverticulitis of large intestine with perforation and abscess without bleeding 10/20/2010   Past Medical History:  Diagnosis Date  . Arthritis   . Diabetes mellitus without complication ([place])   . Diverticulitis of colon   . GERD (gastroesophageal reflux disease)   . Headache   . Hypertension   . Hypothyroidism   . Thyroid disease     Family History  Problem Relation Age of Onset  . Hypertension Mother   . Diabetes Mother   . Heart attack Father   . Heart disease Father   . Diabetes Paternal Grandmother   . Cancer Paternal Grandfather        of eye    Past Surgical History:  Procedure Laterality Date  . ANKLE FRACTURE SURGERY    . ANTERIOR CERVICAL DECOMP/DISCECTOMY FUSION N/A 04/05/2016   Procedure: C5-6, C6-7 Anterior Cervical Discectomy and Fusion, Allograft, Plate;  Surgeon: Marybelle Killings, MD;  Location: [place];  Service: Orthopedics;  Laterality: N/A;  . ANTERIOR FUSION CERVICAL SPINE  04/05/2016   c6 c7  . CARPAL TUNNEL RELEASE Bilateral   . 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 JOINT REPLACEMENT  11/10   right knee replaced   . KNEE SURGERY    .  LEG SURGERY  11/03   broken lower right leg crushed    Social History   Occupational History  . Not on file.   Social History Main Topics  . Smoking status: Former Smoker    Types: Cigarettes    Quit date: 01/10/2008  . Smokeless tobacco: Former Systems developer    Types: Chew  . Alcohol use Yes     Comment: occasionally  . Drug use: No  . Sexual activity: Not on file

## 2016-07-21 NOTE — Addendum Note (Signed)
Addendum  created 07/21/16 1335 by Lyndle Herrlich, MD   Sign clinical note

## 2016-07-21 NOTE — Anesthesia Postprocedure Evaluation (Signed)
Anesthesia Post Note  Patient: [name]  Procedure(s) Performed: Procedure(s) (LRB): C5-6, C6-7 Anterior Cervical Discectomy and Fusion, Allograft, Plate (N/A)     Anesthesia Post Evaluation  Last Vitals:  Vitals:   04/06/16 0412 04/06/16 1437  BP: 138/86 124/86  Pulse: 67 60  Resp: 20 16  Temp: 36.5 C 36.8 C    Last Pain:  Vitals:   04/06/16 1437  TempSrc: Oral  PainSc:                  Riccardo Dubin

## 2016-08-01 ENCOUNTER — Ambulatory Visit (INDEPENDENT_AMBULATORY_CARE_PROVIDER_SITE_OTHER): Payer: 59 | Admitting: Orthopaedic Surgery

## 2016-08-01 ENCOUNTER — Encounter (INDEPENDENT_AMBULATORY_CARE_PROVIDER_SITE_OTHER): Payer: Self-pay | Admitting: Orthopaedic Surgery

## 2016-08-01 ENCOUNTER — Ambulatory Visit (INDEPENDENT_AMBULATORY_CARE_PROVIDER_SITE_OTHER): Payer: 59

## 2016-08-01 VITALS — BP 129/84 | HR 60 | Ht 69.0 in | Wt 195.0 lb

## 2016-08-01 DIAGNOSIS — Z981 Arthrodesis status: Secondary | ICD-10-CM

## 2016-08-01 NOTE — Progress Notes (Signed)
Office Visit Note   Patient: [name]           Date of Birth: 07-26-1950           MRN: [id number] Visit Date: 08/01/2016              Requested by: Binnie Rail, MD [place], [place] [address] PCP: Binnie Rail, MD   Assessment & Plan: Visit Diagnoses:  1. Status post cervical spinal fusion     Plan: He can resume work on 08/21/2016 works at given. FOLLOW-up here in 6 months to be so desires we can repeat cervical spine x-rays. He should get gradual improvement and will call if he needs to be seen.  Follow-Up Instructions: No Follow-up on file.   Orders:  Orders Placed This Encounter  Procedures  . XR Cervical Spine 2 or 3 views   No orders of the defined types were placed in this encounter.     Procedures: No procedures performed   Clinical Data: No additional findings.   Subjective: Chief Complaint  Patient presents with  . Neck - Follow-up    HPI ceased 66-year-old male returns post the two-level cervical fusion C5-6, C6-7 on 04/05/2016. He is not using his collar since May he has some discomfort in his neck sometimes on the left for results on the right side. Later in the days not having problems. He's got good relief is preop arm pain symptoms. He is here for follow-up flexion-extension x-rays.  Review of Systems 14 point review of systems updated and is unchanged from last office visit other than as mentioned in history of present illness.   Objective: Vital Signs: BP 129/84   Pulse 60   Ht 5\' 9"  (1.753 m)   Wt 195 lb (88.5 kg)   BMI 28.80 kg/m   Physical Exam  Constitutional: He is oriented to person, place, and time. He appears well-developed and well-nourished.  HENT:  Head: Normocephalic and atraumatic.  Eyes: Pupils are equal, round, and reactive to light. EOM are normal.  Neck: No tracheal deviation present. No thyromegaly present.  Cardiovascular: Normal rate.   Pulmonary/Chest: Effort normal. He has no wheezes.    Abdominal: Soft. Bowel sounds are normal.  Musculoskeletal:  Patient has well-healed anterior cervical incision for range of motion of the shoulders negative impingement. Reflexes are symmetrical normal heel toe gait no sensory changes. Good muscle strength upper extremities.  Neurological: He is alert and oriented to person, place, and time.  Skin: Skin is warm and dry. Capillary refill takes less than 2 seconds.  Psychiatric: He has a normal mood and affect. His behavior is normal. Judgment and thought content normal.    Ortho Exam  Specialty Comments:  No specialty comments available.  Imaging: Xr Cervical Spine 2 Or 3 Views  Result Date: 08/01/2016 Lateral flexion extension C-spine x-rays obtained and reviewed and AP view. Patient has good incorporation C6-7. Her maybe 1 mm motion at C5-6. He appears to have progressive incorporation on AP x-ray. Impression: Solid C6-7 fusion. Possible delayed fusion at C5-6.    PMFS History: Patient Active Problem List   Diagnosis Date Noted  . Diabetes ([place]) 06/22/2016  . Hypothyroidism 06/22/2016  . Hyperlipidemia 06/22/2016  . GERD (gastroesophageal reflux disease) 06/22/2016  . Status post cervical spinal fusion 05/23/2016  . Cervical spinal stenosis 04/05/2016  . Pain in right wrist 03/07/2016  . Neck pain 12/01/2015  . Other spondylosis with radiculopathy, cervical region 12/01/2015  .  Diverticulitis of colon with perforation 10/20/2010  . Diverticulitis of large intestine with perforation and abscess without bleeding 10/20/2010   Past Medical History:  Diagnosis Date  . Arthritis   . Diabetes mellitus without complication ([place])   . Diverticulitis of colon   . GERD (gastroesophageal reflux disease)   . Headache   . Hypertension   . Hypothyroidism   . Thyroid disease     Family History  Problem Relation Age of Onset  . Hypertension Mother   . Diabetes Mother   . Heart attack Father   . Heart disease Father   . Diabetes  Paternal Grandmother   . Cancer Paternal Grandfather        of eye    Past Surgical History:  Procedure Laterality Date  . ANKLE FRACTURE SURGERY    . ANTERIOR CERVICAL DECOMP/DISCECTOMY FUSION N/A 04/05/2016   Procedure: C5-6, C6-7 Anterior Cervical Discectomy and Fusion, Allograft, Plate;  Surgeon: Marybelle Killings, MD;  Location: [place];  Service: Orthopedics;  Laterality: N/A;  . ANTERIOR FUSION CERVICAL SPINE  04/05/2016   c6 c7  . CARPAL TUNNEL RELEASE Bilateral   . 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 JOINT REPLACEMENT  11/10   right knee replaced   . KNEE SURGERY    . LEG SURGERY  11/03   broken lower right leg crushed    Social History   Occupational History  . Not on file.   Social History Main Topics  . Smoking status: Former Smoker    Types: Cigarettes    Quit date: 01/10/2008  . Smokeless tobacco: Former Systems developer    Types: Chew  . Alcohol use Yes     Comment: occasionally  . Drug use: No  . Sexual activity: Not on file

## 2016-10-30 ENCOUNTER — Other Ambulatory Visit: Payer: Self-pay

## 2016-10-30 MED ORDER — LEVOTHYROXINE SODIUM 100 MCG PO TABS: 100.0000 ug | ORAL_TABLET | Freq: Every day | ORAL | 0 refills | Status: DC

## 2016-11-20 LAB — HM DIABETES EYE EXAM

## 2016-11-28 ENCOUNTER — Encounter: Payer: Self-pay | Admitting: Internal Medicine

## 2016-12-04 ENCOUNTER — Other Ambulatory Visit: Payer: Self-pay | Admitting: Internal Medicine

## 2016-12-04 ENCOUNTER — Other Ambulatory Visit: Payer: Self-pay

## 2016-12-04 MED ORDER — METFORMIN HCL ER 500 MG PO TB24: 1000.0000 mg | ORAL_TABLET | Freq: Every day | ORAL | 0 refills | Status: DC

## 2016-12-04 MED ORDER — CEFDINIR 300 MG PO CAPS: 300.0000 mg | ORAL_CAPSULE | Freq: Two times a day (BID) | ORAL | 0 refills | Status: DC

## 2016-12-26 ENCOUNTER — Ambulatory Visit: Payer: 59 | Admitting: Internal Medicine

## 2016-12-26 NOTE — Patient Instructions (Addendum)
Your results will be released to [facility] (or called to you) after review, usually within 72hours after test completion. If any changes need to be made, you will be notified at that same time.  All other Health Maintenance issues reviewed.   All recommended immunizations and age-appropriate screenings are up-to-date or discussed.  No immunizations administered today. An EKG ws done today.    Medications reviewed and updated.  No changes recommended at this time.  Your prescription(s) have been submitted to your pharmacy. Please take as directed and contact our office if you believe you are having problem(s) with the medication(s).  A stress test was ordered.    Please followup in 6 months    Health Maintenance, Male A healthy lifestyle and preventive care is important for your health and wellness. Ask your health care provider about what schedule of regular examinations is right for you. What should I know about weight and diet? Eat a Healthy Diet  Eat plenty of vegetables, fruits, whole grains, low-fat dairy products, and lean protein.  Do not eat a lot of foods high in solid fats, added sugars, or salt.  Maintain a Healthy Weight Regular exercise can help you achieve or maintain a healthy weight. You should:  Do at least 150 minutes of exercise each week. The exercise should increase your heart rate and make you sweat (moderate-intensity exercise).  Do strength-training exercises at least twice a week.  Watch Your Levels of Cholesterol and Blood Lipids  Have your blood tested for lipids and cholesterol every 5 years starting at 64 years of age. If you are at high risk for heart disease, you should start having your blood tested when you are 64 years old. You may need to have your cholesterol levels checked more often if: ? Your lipid or cholesterol levels are high. ? You are older than 64 years of age. ? You are at high risk for heart disease.  What should I know about cancer  screening? Many types of cancers can be detected early and may often be prevented. Lung Cancer  You should be screened every year for lung cancer if: ? You are a current smoker who has smoked for at least 30 years. ? You are a former smoker who has quit within the past 15 years.  Talk to your health care provider about your screening options, when you should start screening, and how often you should be screened.  Colorectal Cancer  Routine colorectal cancer screening usually begins at 64 years of age and should be repeated every 5-10 years until you are 64 years old. You may need to be screened more often if early forms of precancerous polyps or small growths are found. Your health care provider may recommend screening at an earlier age if you have risk factors for colon cancer.  Your health care provider may recommend using home test kits to check for hidden blood in the stool.  A small camera at the end of a tube can be used to examine your colon (sigmoidoscopy or colonoscopy). This checks for the earliest forms of colorectal cancer.  Prostate and Testicular Cancer  Depending on your age and overall health, your health care provider may do certain tests to screen for prostate and testicular cancer.  Talk to your health care provider about any symptoms or concerns you have about testicular or prostate cancer.  Skin Cancer  Check your skin from head to toe regularly.  Tell your health care provider about any new moles  or changes in moles, especially if: ? There is a change in a mole's size, shape, or color. ? You have a mole that is larger than a pencil eraser.  Always use sunscreen. Apply sunscreen liberally and repeat throughout the day.  Protect yourself by wearing long sleeves, pants, a wide-brimmed hat, and sunglasses when outside.  What should I know about heart disease, diabetes, and high blood pressure?  If you are 89-68 years of age, have your blood pressure checked  every 3-5 years. If you are 84 years of age or older, have your blood pressure checked every year. You should have your blood pressure measured twice-once when you are at a hospital or clinic, and once when you are not at a hospital or clinic. Record the average of the two measurements. To check your blood pressure when you are not at a hospital or clinic, you can use: ? An automated blood pressure machine at a pharmacy. ? A home blood pressure monitor.  Talk to your health care provider about your target blood pressure.  If you are between 34-51 years old, ask your health care provider if you should take aspirin to prevent heart disease.  Have regular diabetes screenings by checking your fasting blood sugar level. ? If you are at a normal weight and have a low risk for diabetes, have this test once every three years after the age of 36. ? If you are overweight and have a high risk for diabetes, consider being tested at a younger age or more often.  A one-time screening for abdominal aortic aneurysm (AAA) by ultrasound is recommended for men aged 7-75 years who are current or former smokers. What should I know about preventing infection? Hepatitis B If you have a higher risk for hepatitis B, you should be screened for this virus. Talk with your health care provider to find out if you are at risk for hepatitis B infection. Hepatitis C Blood testing is recommended for:  Everyone born from [birth year] through 1963.  Anyone with known risk factors for hepatitis C.  Sexually Transmitted Diseases (STDs)  You should be screened each year for STDs including gonorrhea and chlamydia if: ? You are sexually active and are younger than 64 years of age. ? You are older than 64 years of age and your health care provider tells you that you are at risk for this type of infection. ? Your sexual activity has changed since you were last screened and you are at an increased risk for chlamydia or gonorrhea. Ask your  health care provider if you are at risk.  Talk with your health care provider about whether you are at high risk of being infected with HIV. Your health care provider may recommend a prescription medicine to help prevent HIV infection.  What else can I do?  Schedule regular health, dental, and eye exams.  Stay current with your vaccines (immunizations).  Do not use any tobacco products, such as cigarettes, chewing tobacco, and e-cigarettes. If you need help quitting, ask your health care provider.  Limit alcohol intake to no more than 2 drinks per day. One drink equals 12 ounces of beer, 5 ounces of wine, or 1 ounces of hard liquor.  Do not use street drugs.  Do not share needles.  Ask your health care provider for help if you need support or information about quitting drugs.  Tell your health care provider if you often feel depressed.  Tell your health care provider if you have  ever been abused or do not feel safe at home. This information is not intended to replace advice given to you by your health care provider. Make sure you discuss any questions you have with your health care provider. Document Released: 06/24/2007 Document Revised: 08/25/2015 Document Reviewed: 09/29/2014 Elsevier Interactive Patient Education  2018 [employer]  

## 2016-12-26 NOTE — Progress Notes (Signed)
Subjective:    Patient ID: [name], male    DOB: May 27, 1950, 66 y.o.   MRN: [id number]  HPI He is here for a physical exam.   He did not take his BP yet this morning.   He occasionally has chest pain.  It sometimes wakes him up.  He does not always have reflux with it.  It can last 10-15 minutes.  He last had an episode approximately 2 months ago.  He denies associated symptoms of palpitations, shortness of breath and lightheadedness.  He has not experienced any pain in the last couple of months with exertion.  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    Medications and allergies reviewed with patient and updated if appropriate.  Patient Active Problem List   Diagnosis Date Noted  . Diabetes ([place]) 06/22/2016  . Hypothyroidism 06/22/2016  . Hyperlipidemia 06/22/2016  . GERD (gastroesophageal reflux disease) 06/22/2016  . Status post cervical spinal fusion 05/23/2016  . Cervical spinal stenosis 04/05/2016  . Pain in right wrist 03/07/2016  . Neck pain 12/01/2015  . Other spondylosis with radiculopathy, cervical region 12/01/2015  . Diverticulitis of colon with perforation 10/20/2010  . Diverticulitis of large intestine with perforation and abscess without bleeding 10/20/2010    Current Outpatient Medications on File Prior to Visit  Medication Sig Dispense Refill  . Aspirin-Acetaminophen-Caffeine (GOODY HEADACHE PO) Take 2 packets by mouth daily.    Marland Kitchen levothyroxine (SYNTHROID, LEVOTHROID) 100 MCG tablet Take 1 tablet (100 mcg total) by mouth daily before breakfast. 90 tablet 0  . losartan-hydrochlorothiazide (HYZAAR) 100-12.5 MG tablet Take 1 tablet by mouth daily after breakfast. 90 tablet 3  . metFORMIN (GLUCOPHAGE-XR) 500 MG 24 hr tablet Take 2 tablets (1,000 mg total) by mouth at bedtime. 180 tablet 0  . methocarbamol (ROBAXIN) 500 MG tablet Take 1 tablet (500 mg total) by mouth every 8 (eight) hours as needed for muscle spasms. 40 tablet 0  .  pantoprazole (PROTONIX) 20 MG tablet Take 1 tablet (20 mg total) by mouth daily after breakfast. 90 tablet 3   No current facility-administered medications on file prior to visit.     Past Medical History:  Diagnosis Date  . Arthritis   . Diabetes mellitus without complication ([place])   . Diverticulitis of colon   . GERD (gastroesophageal reflux disease)   . Headache   . Hypertension   . Hypothyroidism   . Thyroid disease     Past Surgical History:  Procedure Laterality Date  . ANKLE FRACTURE SURGERY    . ANTERIOR CERVICAL DECOMP/DISCECTOMY FUSION N/A 04/05/2016   Procedure: C5-6, C6-7 Anterior Cervical Discectomy and Fusion, Allograft, Plate;  Surgeon: Marybelle Killings, MD;  Location: [facility];  Service: Orthopedics;  Laterality: N/A;  . ANTERIOR FUSION CERVICAL SPINE  04/05/2016   c6 c7  . CARPAL TUNNEL RELEASE Bilateral   . 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 JOINT REPLACEMENT  11/10   right knee replaced   . KNEE SURGERY    . LEG SURGERY  11/03   broken lower right leg crushed     Social History   Socioeconomic History  . Marital status: Divorced    Spouse name: None  . Number of children: None  . Years of education: None  . Highest education level: None  Social Needs  . Financial resource strain: None  . Food insecurity - worry: None  . Food insecurity - inability: None  .  Transportation needs - medical: None  . Transportation needs - non-medical: None  Occupational History  . None  Tobacco Use  . Smoking status: Former Smoker    Types: Cigarettes    Last attempt to quit: 01/10/2008    Years since quitting: 8.9  . Smokeless tobacco: Former Systems developer    Types: Chew  Substance and Sexual Activity  . Alcohol use: Yes    Comment: occasionally  . Drug use: No  . Sexual activity: None  Other Topics Concern  . None  Social History Narrative  . None    Family History  Problem Relation Age of Onset  . Hypertension Mother   . Diabetes Mother   . Heart  attack Father   . Heart disease Father   . Diabetes Paternal Grandmother   . Cancer Paternal Grandfather        of eye    Review of Systems  Constitutional: Negative for chills and fever.  Eyes: Negative for visual disturbance.  Respiratory: Negative for cough, shortness of breath and wheezing.   Cardiovascular: Positive for chest pain (substernal - last episode 2 months). Negative for palpitations and leg swelling.  Gastrointestinal: Positive for abdominal pain (occ - flare of diverticulosis - improves with changes of diet). Negative for blood in stool, constipation, diarrhea and nausea.  Genitourinary: Negative for difficulty urinating, dysuria and hematuria.  Musculoskeletal: Positive for arthralgias (shoulders, feet) and neck pain.  Skin: Negative for color change and rash.  Neurological: Positive for light-headedness (occ- with standing quickly), numbness (left hand) and headaches (sinus related).  Psychiatric/Behavioral: Negative for dysphoric mood. The patient is not nervous/anxious.        Objective:   Vitals:   12/27/16 0904  BP: (!) 144/84  Pulse: 64  Resp: 16  Temp: 98.8 F (37.1 C)  SpO2: 97%   Filed Weights   12/27/16 0904  Weight: 198 lb 12.8 oz (90.2 kg)   Body mass index is 29.36 kg/m.  Wt Readings from Last 3 Encounters:  12/27/16 198 lb 12.8 oz (90.2 kg)  08/01/16 195 lb (88.5 kg)  06/28/16 195 lb (88.5 kg)     Physical Exam Constitutional: He appears well-developed and well-nourished. No distress.  HENT:  Head: Normocephalic and atraumatic.  Right Ear: External ear normal.  Left Ear: External ear normal.  Mouth/Throat: Oropharynx is clear and moist.  Normal ear canals and TM b/l  Eyes: Conjunctivae and EOM are normal.  Neck: Neck supple. No tracheal deviation present. No thyromegaly present.  No carotid bruit  Cardiovascular: Normal rate, regular rhythm, normal heart sounds and intact distal pulses.   No murmur heard. Pulmonary/Chest:  Effort normal and breath sounds normal. No respiratory distress. He has no wheezes. He has no rales.  Abdominal: Soft. He exhibits no distension. There is no tenderness.  Genitourinary: deferred  Musculoskeletal: He exhibits no edema.  Lymphadenopathy:   He has no cervical adenopathy.  Skin: Skin is warm and dry. He is not diaphoretic.  Psychiatric: He has a normal mood and affect. His behavior is normal.    Diabetic Foot Exam - Simple   Simple Foot Form Diabetic Foot exam was performed with the following findings:  Yes   Visual Inspection See comments:  Yes Sensation Testing Intact to touch and monofilament testing bilaterally:  Yes Pulse Check Posterior Tibialis and Dorsalis pulse intact bilaterally:  Yes Comments Bilateral bunions, mild inverted right first toe, no ulcerations or other deformities.  No skin breakdown bilaterally  Assessment & Plan:   Physical exam: Screening blood work  ordered Immunizations  Flu up to date, tetanus due Colonoscopy up-to-date Eye exams  Up to date  EKG  Last done 03/2016 - repeat today Exercise - not walking as much due to weather, very active Weight  Good for age Skin  No concerns Substance abuse  None    See Problem List for Assessment and Plan of chronic medical problems.   FU in 6 months

## 2016-12-27 ENCOUNTER — Ambulatory Visit (INDEPENDENT_AMBULATORY_CARE_PROVIDER_SITE_OTHER): Payer: 59 | Admitting: Internal Medicine

## 2016-12-27 ENCOUNTER — Encounter: Payer: Self-pay | Admitting: Internal Medicine

## 2016-12-27 ENCOUNTER — Other Ambulatory Visit (INDEPENDENT_AMBULATORY_CARE_PROVIDER_SITE_OTHER): Payer: 59

## 2016-12-27 VITALS — BP 144/84 | HR 64 | Temp 98.8°F | Resp 16 | Ht 69.0 in | Wt 198.8 lb

## 2016-12-27 DIAGNOSIS — Z0000 Encounter for general adult medical examination without abnormal findings: Secondary | ICD-10-CM

## 2016-12-27 DIAGNOSIS — E7849 Other hyperlipidemia: Secondary | ICD-10-CM | POA: Diagnosis not present

## 2016-12-27 DIAGNOSIS — E038 Other specified hypothyroidism: Secondary | ICD-10-CM | POA: Diagnosis not present

## 2016-12-27 DIAGNOSIS — Z1159 Encounter for screening for other viral diseases: Secondary | ICD-10-CM

## 2016-12-27 DIAGNOSIS — R079 Chest pain, unspecified: Secondary | ICD-10-CM | POA: Insufficient documentation

## 2016-12-27 DIAGNOSIS — Z114 Encounter for screening for human immunodeficiency virus [HIV]: Secondary | ICD-10-CM

## 2016-12-27 DIAGNOSIS — E119 Type 2 diabetes mellitus without complications: Secondary | ICD-10-CM

## 2016-12-27 DIAGNOSIS — K219 Gastro-esophageal reflux disease without esophagitis: Secondary | ICD-10-CM

## 2016-12-27 LAB — LIPID PANEL
CHOL/HDL RATIO: 3
Cholesterol: 135 mg/dL (ref 0–200)
HDL: 45.9 mg/dL (ref >39.00)
LDL CALC: 67 mg/dL (ref 0–99)
NonHDL: 88.95
Triglycerides: 108 mg/dL (ref 0.0–149.0)
VLDL: 21.6 mg/dL (ref 0.0–40.0)

## 2016-12-27 LAB — COMPREHENSIVE METABOLIC PANEL
ALK PHOS: 58 U/L (ref 39–117)
ALT: 17 U/L (ref 0–53)
AST: 19 U/L (ref 0–37)
Albumin: 4.2 g/dL (ref 3.5–5.2)
BUN: 24 mg/dL — AB (ref 6–23)
CHLORIDE: 103 meq/L (ref 96–112)
CO2: 29 mEq/L (ref 19–32)
Calcium: 9.2 mg/dL (ref 8.4–10.5)
Creatinine, Ser: 1.26 mg/dL (ref 0.40–1.50)
GFR: 61.21 mL/min (ref >60.00)
Glucose, Bld: 108 mg/dL — ABNORMAL HIGH (ref 70–99)
POTASSIUM: 4.3 meq/L (ref 3.5–5.1)
SODIUM: 138 meq/L (ref 135–145)
Total Bilirubin: 0.5 mg/dL (ref 0.2–1.2)
Total Protein: 7.3 g/dL (ref 6.0–8.3)

## 2016-12-27 LAB — CBC WITH DIFFERENTIAL/PLATELET
BASOS PCT: 1.9 % (ref 0.0–3.0)
Basophils Absolute: 0.1 10*3/uL (ref 0.0–0.1)
EOS ABS: 0.5 10*3/uL (ref 0.0–0.7)
Eosinophils Relative: 9.6 % — ABNORMAL HIGH (ref 0.0–5.0)
HCT: 43 % (ref 39.0–52.0)
HEMOGLOBIN: 14.1 g/dL (ref 13.0–17.0)
Lymphocytes Relative: 34.2 % (ref 12.0–46.0)
Lymphs Abs: 1.8 10*3/uL (ref 0.7–4.0)
MCHC: 32.9 g/dL (ref 30.0–36.0)
MCV: 91.1 fl (ref 78.0–100.0)
MONO ABS: 0.5 10*3/uL (ref 0.1–1.0)
Monocytes Relative: 8.8 % (ref 3.0–12.0)
NEUTROS ABS: 2.4 10*3/uL (ref 1.4–7.7)
NEUTROS PCT: 45.5 % (ref 43.0–77.0)
PLATELETS: 284 10*3/uL (ref 150.0–400.0)
RBC: 4.72 Mil/uL (ref 4.22–5.81)
RDW: 14.1 % (ref 11.5–15.5)
WBC: 5.3 10*3/uL (ref 4.0–10.5)

## 2016-12-27 LAB — HEMOGLOBIN A1C: HEMOGLOBIN A1C: 6.8 % — AB (ref 4.6–6.5)

## 2016-12-27 LAB — TSH: TSH: 3.69 u[IU]/mL (ref 0.35–4.50)

## 2016-12-27 MED ORDER — LOSARTAN POTASSIUM-HCTZ 100-12.5 MG PO TABS: 1.0000 | ORAL_TABLET | Freq: Every day | ORAL | 3 refills | Status: DC

## 2016-12-27 MED ORDER — PANTOPRAZOLE SODIUM 20 MG PO TBEC: 20.0000 mg | DELAYED_RELEASE_TABLET | Freq: Every day | ORAL | 3 refills | Status: DC

## 2016-12-27 MED ORDER — LEVOTHYROXINE SODIUM 100 MCG PO TABS: 100.0000 ug | ORAL_TABLET | Freq: Every day | ORAL | 3 refills | Status: DC

## 2016-12-27 MED ORDER — METFORMIN HCL ER 500 MG PO TB24: 1000.0000 mg | ORAL_TABLET | Freq: Every day | ORAL | 3 refills | Status: DC

## 2016-12-27 NOTE — Assessment & Plan Note (Addendum)
Not likely cardiac, but given age and diabetes -- will get EKG today and order a stress test EKG today shows sinus brady at 57 bpm, nonspecific T wave abnormality - likely normal variant, otherwise normal.  No change from prior 03/2016

## 2016-12-27 NOTE — Assessment & Plan Note (Signed)
Check tsh  Titrate med dose if needed  

## 2016-12-27 NOTE — Assessment & Plan Note (Signed)
Check a1c Low sugar / carb diet Stressed regular exercise   

## 2016-12-27 NOTE — Assessment & Plan Note (Signed)
GERD controlled Continue daily medication  

## 2016-12-27 NOTE — Assessment & Plan Note (Signed)
Check lipid panel  Not currently on a statin Regular exercise and healthy diet encouraged

## 2016-12-29 LAB — PSA, TOTAL AND FREE
PSA, % FREE: 33 % (ref >25)
PSA, FREE: 0.1 ng/mL
PSA, Total: 0.3 ng/mL (ref <=4.0)

## 2016-12-29 LAB — HEPATITIS C ANTIBODY
HEP C AB: NONREACTIVE
SIGNAL TO CUT-OFF: 0.02 (ref <1.00)

## 2016-12-29 LAB — HIV ANTIBODY (ROUTINE TESTING W REFLEX): HIV: NONREACTIVE

## 2016-12-31 ENCOUNTER — Encounter: Payer: Self-pay | Admitting: Internal Medicine

## 2017-01-18 ENCOUNTER — Telehealth (HOSPITAL_COMMUNITY): Payer: Self-pay | Admitting: *Deleted

## 2017-01-18 NOTE — Telephone Encounter (Signed)
Left message on voicemail in reference to upcoming appointment scheduled for 01/23/17. Phone number given for a call back so details instructions can be given. [name]

## 2017-01-23 ENCOUNTER — Other Ambulatory Visit (HOSPITAL_COMMUNITY): Payer: 59

## 2017-01-23 ENCOUNTER — Ambulatory Visit (HOSPITAL_COMMUNITY): Payer: 59 | Attending: Cardiology

## 2017-01-23 ENCOUNTER — Encounter: Payer: Self-pay | Admitting: Interventional Cardiology

## 2017-01-23 ENCOUNTER — Encounter: Payer: Self-pay | Admitting: Internal Medicine

## 2017-01-23 ENCOUNTER — Ambulatory Visit (INDEPENDENT_AMBULATORY_CARE_PROVIDER_SITE_OTHER): Payer: 59 | Admitting: Interventional Cardiology

## 2017-01-23 ENCOUNTER — Ambulatory Visit (HOSPITAL_COMMUNITY): Payer: 59

## 2017-01-23 DIAGNOSIS — R9439 Abnormal result of other cardiovascular function study: Secondary | ICD-10-CM | POA: Diagnosis not present

## 2017-01-23 DIAGNOSIS — E118 Type 2 diabetes mellitus with unspecified complications: Secondary | ICD-10-CM

## 2017-01-23 DIAGNOSIS — R0789 Other chest pain: Secondary | ICD-10-CM

## 2017-01-23 DIAGNOSIS — E782 Mixed hyperlipidemia: Secondary | ICD-10-CM

## 2017-01-23 DIAGNOSIS — R079 Chest pain, unspecified: Secondary | ICD-10-CM | POA: Diagnosis not present

## 2017-01-23 NOTE — H&P (View-Only) (Signed)
Cardiology Office Note    Date:  01/23/2017   ID:  [name], DOB 03-Nov-1950, MRN [id number]  PCP:  Binnie Rail, MD  Cardiologist: Sinclair Grooms, MD   Chief Complaint  Patient presents with  . Coronary Artery Disease    Chest pain and abnormal stress test    History of Present Illness:  [name] is a 67 y.o. male with history of diabetes mellitus, hyperlipidemia, and recent episodes of recurrent chest discomfort who had an abnormal stress test performed in the office today.  The patient is referred by Dr. Billey Gosling.  Patient gives a 65-month history of recurring episodes of chest discomfort that awakened him from sleep.  These episodes are described as an aching retrosternal discomfort the last up to 15 minutes and resolve spontaneously.  He has had a total of 3-4 episodes.  He is also noted that with physical activity, beyond a certain level of exertion of mild eversion of the same discomfort is precipitated and relieved by rest.  When he saw Dr. Quay Burow recently, he discussed his symptoms and was sent for an exercise test.  A stress echo was performed today.  The stress test demonstrated diagnostic changes of ischemia during the second stage of exercise.  He walked a total of 8 minutes and 35 seconds.  He had persistent ST-T wave abnormality out to 7 minutes post exercise.  Dyspnea and chest discomfort resolved by 2 minutes of recovery.  Because of the abnormalities noted he was worked into my schedule at my request to discuss the findings and arrange for definitive diagnostic workup.  Past Medical History:  Diagnosis Date  . Arthritis   . Diabetes mellitus without complication ([place])   . Diverticulitis of colon   . GERD (gastroesophageal reflux disease)   . Headache   . Hypertension   . Hypothyroidism   . Thyroid disease     Past Surgical History:  Procedure Laterality Date  . ANKLE FRACTURE SURGERY    . ANTERIOR CERVICAL DECOMP/DISCECTOMY FUSION N/A  04/05/2016   Procedure: C5-6, C6-7 Anterior Cervical Discectomy and Fusion, Allograft, Plate;  Surgeon: Marybelle Killings, MD;  Location: [facility];  Service: Orthopedics;  Laterality: N/A;  . ANTERIOR FUSION CERVICAL SPINE  04/05/2016   c6 c7  . CARPAL TUNNEL RELEASE Bilateral   . 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 JOINT REPLACEMENT  11/10   right knee replaced   . KNEE SURGERY    . LEG SURGERY  11/03   broken lower right leg crushed     Current Medications: Outpatient Medications Prior to Visit  Medication Sig Dispense Refill  . Aspirin-Acetaminophen-Caffeine (GOODY HEADACHE PO) Take 2 packets by mouth daily.    Marland Kitchen levothyroxine (SYNTHROID, LEVOTHROID) 100 MCG tablet Take 1 tablet (100 mcg total) by mouth daily before breakfast. 90 tablet 3  . losartan-hydrochlorothiazide (HYZAAR) 100-12.5 MG tablet Take 1 tablet by mouth daily after breakfast. 90 tablet 3  . metFORMIN (GLUCOPHAGE-XR) 500 MG 24 hr tablet Take 2 tablets (1,000 mg total) by mouth at bedtime. 180 tablet 3  . methocarbamol (ROBAXIN) 500 MG tablet Take 1 tablet (500 mg total) by mouth every 8 (eight) hours as needed for muscle spasms. 40 tablet 0  . pantoprazole (PROTONIX) 20 MG tablet Take 1 tablet (20 mg total) by mouth daily after breakfast. 90 tablet 3   No facility-administered medications prior to visit.      Allergies:  Morphine   Social History   Socioeconomic History  . Marital status: Divorced    Spouse name: Not on file  . Number of children: Not on file  . Years of education: Not on file  . Highest education level: Not on file  Social Needs  . Financial resource strain: Not on file  . Food insecurity - worry: Not on file  . Food insecurity - inability: Not on file  . Transportation needs - medical: Not on file  . Transportation needs - non-medical: Not on file  Occupational History  . Not on file  Tobacco Use  . Smoking status: Former Smoker    Types: Cigarettes    Last attempt to quit:  01/10/2008    Years since quitting: 9.0  . Smokeless tobacco: Former Systems developer    Types: Chew  Substance and Sexual Activity  . Alcohol use: Yes    Comment: occasionally  . Drug use: No  . Sexual activity: Not on file  Other Topics Concern  . Not on file  Social History Narrative  . Not on file     Family History:  The patient's family history includes Cancer in his paternal grandfather; Diabetes in his mother and paternal grandmother; Heart attack in his father; Heart disease in his father; Hypertension in his mother.   ROS:   Please see the history of present illness.    Severe bilateral knee discomfort.  Prior bilateral knee replacements.  History of gastroesophageal reflux. All other systems reviewed and are negative.   PHYSICAL EXAM:   VS: Blood pressure 158/78, heart rate 76 bpm, respirations 14 and nonlabored. GEN: Well nourished, well developed, in no acute distress  HEENT: normal  Neck: no JVD, carotid bruits, or masses Cardiac: RRR; no murmurs, rubs, or edema. Respiratory:  clear to auscultation bilaterally, normal work of breathing. GI: soft, nontender, nondistended, + BS MS: no deformity or atrophy.  Deformed bilateral knees secondary to prior replacements in the setting of osteoarthritis. Skin: warm and dry, no rash Neuro:  Alert and Oriented x 3, Strength and sensation are intact Psych: euthymic mood, full affect  Wt Readings from Last 3 Encounters:  12/27/16 198 lb 12.8 oz (90.2 kg)  08/01/16 195 lb (88.5 kg)  06/28/16 195 lb (88.5 kg)      Studies/Labs Reviewed:   EKG:  EKG normal sinus rhythm, heart rate 76 bpm, poor R wave progression   Recent Labs: 12/27/2016: ALT 17; BUN 24; Creatinine, Ser 1.26; Hemoglobin 14.1; Platelets 284.0; Potassium 4.3; Sodium 138; TSH 3.69   Lipid Panel    Component Value Date/Time   CHOL 135 12/27/2016 0854   TRIG 108.0 12/27/2016 0854   HDL 45.90 12/27/2016 0854   CHOLHDL 3 12/27/2016 0854   VLDL 21.6 12/27/2016 0854     LDLCALC 67 12/27/2016 0854    Additional studies/ records that were reviewed today include:   Exercise treadmill test done as part of a stress echo study-01/23/2017: Patient exercised on the Bruce protocol as part of a stress echo.  He started with a normal EKG.  He achieved peak heart rate of 160 bpm which was 104% of predicted heart rate.  He began developing diagnostic ST segment changes by 5 minutes of exercise (around 2 minutes of stage II).  No symptoms developed at that time.  End stage III he developed dyspnea, the test was stopped as the target heart rate worse surpassed.  Total workload 8.2 METs.  Diagnostic ST segment changes of ischemia and involving  the inferolateral leads II, 3, aVF, the L4 through V6.  Echo results are pending.  ASSESSMENT:    1. Chest discomfort   2. Abnormal stress test   3. Mixed hyperlipidemia   4. Type 2 diabetes mellitus with complication, without long-term current use of insulin (HCC)      PLAN:  In order of problems listed above:  1. Chest discomfort is highly likely to represent ischemic pain given the positive ST abnormalities noted the stress ECG.  Echo results are pending.  Given complaints and abnormalities on the stress test the patient needs to have coronary angiography.  I have recommended that he undergo this study and have talked to him concerning the risks and benefits.  I have asked him to continue to take an aspirin per day.  Symptoms have been ongoing for at least 6 months.  Prolonged chest discomfort not relieved by rest should prompt immediate emergency room visit by EMS.  He did not commit to coronary angiography until he has an opportunity to speak with his daughter who is a Psychologist, counselling with Dr. Quay Burow.         The patient was counseled to undergo left heart catheterization, coronary angiography, and possible percutaneous coronary intervention with stent implantation. The procedural risks and benefits were discussed in detail.  The risks discussed included death, stroke, myocardial infarction, life-threatening bleeding, limb ischemia, kidney injury, allergy, and possible emergency cardiac surgery. The risk of these significant complications were estimated to occur less than 1% of the time. After discussion, the patient has agreed to proceed.    Medication Adjustments/Labs and Tests Ordered: Current medicines are reviewed at length with the patient today.  Concerns regarding medicines are outlined above.  Medication changes, Labs and Tests ordered today are listed in the Patient Instructions below. Patient Instructions  Medication Instructions:  Your physician recommends that you continue on your current medications as directed. Please refer to the Current Medication list given to you today.  Labwork: You will need lab work prior to your cath.  Testing/Procedures: Your physician has requested that you have a cardiac catheterization. Cardiac catheterization is used to diagnose and/or treat various heart conditions. Doctors may recommend this procedure for a number of different reasons. The most common reason is to evaluate chest pain. Chest pain can be a symptom of coronary artery disease (CAD), and cardiac catheterization can show whether plaque is narrowing or blocking your heart's arteries. This procedure is also used to evaluate the valves, as well as measure the blood flow and oxygen levels in different parts of your heart. For further information please visit HugeFiesta.[email or web address]. Please follow instruction sheet, as given.    Follow-Up: Your physician recommends that you schedule a follow-up appointment 2-3 weeks after heart cath.    Any Other Special Instructions Will Be Listed Below (If Applicable).  Speak with your family and contact us about getting your heart cath.  Below are the dates that Dr. Tamala Julian will be back in the cath lab.  1/29 1/30   If you need a refill on your cardiac medications before your  next appointment, please call your pharmacy.      Signed, Sinclair Grooms, MD  01/23/2017 5:50 PM    [facility] [address], [place], [place]  [address] Phone: [phone]; Fax: [phone]

## 2017-01-23 NOTE — Progress Notes (Signed)
Cardiology Office Note    Date:  01/23/2017   ID:  [name], DOB [birth year], MRN [id number]  PCP:  Binnie Rail, MD  Cardiologist: Sinclair Grooms, MD   Chief Complaint  Patient presents with  . Coronary Artery Disease    Chest pain and abnormal stress test    History of Present Illness:  [name] is a 67 y.o. male with history of diabetes mellitus, hyperlipidemia, and recent episodes of recurrent chest discomfort who had an abnormal stress test performed in the office today.  The patient is referred by Dr. Billey Gosling.  Patient gives a 15-month history of recurring episodes of chest discomfort that awakened him from sleep.  These episodes are described as an aching retrosternal discomfort the last up to 15 minutes and resolve spontaneously.  He has had a total of 3-4 episodes.  He is also noted that with physical activity, beyond a certain level of exertion of mild eversion of the same discomfort is precipitated and relieved by rest.  When he saw Dr. Quay Burow recently, he discussed his symptoms and was sent for an exercise test.  A stress echo was performed today.  The stress test demonstrated diagnostic changes of ischemia during the second stage of exercise.  He walked a total of 8 minutes and 35 seconds.  He had persistent ST-T wave abnormality out to 7 minutes post exercise.  Dyspnea and chest discomfort resolved by 2 minutes of recovery.  Because of the abnormalities noted he was worked into my schedule at my request to discuss the findings and arrange for definitive diagnostic workup.  Past Medical History:  Diagnosis Date  . Arthritis   . Diabetes mellitus without complication ([place])   . Diverticulitis of colon   . GERD (gastroesophageal reflux disease)   . Headache   . Hypertension   . Hypothyroidism   . Thyroid disease     Past Surgical History:  Procedure Laterality Date  . ANKLE FRACTURE SURGERY    . ANTERIOR CERVICAL DECOMP/DISCECTOMY FUSION N/A  04/05/2016   Procedure: C5-6, C6-7 Anterior Cervical Discectomy and Fusion, Allograft, Plate;  Surgeon: Marybelle Killings, MD;  Location: [facility];  Service: Orthopedics;  Laterality: N/A;  . ANTERIOR FUSION CERVICAL SPINE  04/05/2016   c6 c7  . CARPAL TUNNEL RELEASE Bilateral   . 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 JOINT REPLACEMENT  11/10   right knee replaced   . KNEE SURGERY    . LEG SURGERY  11/03   broken lower right leg crushed     Current Medications: Outpatient Medications Prior to Visit  Medication Sig Dispense Refill  . Aspirin-Acetaminophen-Caffeine (GOODY HEADACHE PO) Take 2 packets by mouth daily.    Marland Kitchen levothyroxine (SYNTHROID, LEVOTHROID) 100 MCG tablet Take 1 tablet (100 mcg total) by mouth daily before breakfast. 90 tablet 3  . losartan-hydrochlorothiazide (HYZAAR) 100-12.5 MG tablet Take 1 tablet by mouth daily after breakfast. 90 tablet 3  . metFORMIN (GLUCOPHAGE-XR) 500 MG 24 hr tablet Take 2 tablets (1,000 mg total) by mouth at bedtime. 180 tablet 3  . methocarbamol (ROBAXIN) 500 MG tablet Take 1 tablet (500 mg total) by mouth every 8 (eight) hours as needed for muscle spasms. 40 tablet 0  . pantoprazole (PROTONIX) 20 MG tablet Take 1 tablet (20 mg total) by mouth daily after breakfast. 90 tablet 3   No facility-administered medications prior to visit.      Allergies:  Morphine   Social History   Socioeconomic History  . Marital status: Divorced    Spouse name: Not on file  . Number of children: Not on file  . Years of education: Not on file  . Highest education level: Not on file  Social Needs  . Financial resource strain: Not on file  . Food insecurity - worry: Not on file  . Food insecurity - inability: Not on file  . Transportation needs - medical: Not on file  . Transportation needs - non-medical: Not on file  Occupational History  . Not on file  Tobacco Use  . Smoking status: Former Smoker    Types: Cigarettes    Last attempt to quit:  01/10/2008    Years since quitting: 9.0  . Smokeless tobacco: Former Systems developer    Types: Chew  Substance and Sexual Activity  . Alcohol use: Yes    Comment: occasionally  . Drug use: No  . Sexual activity: Not on file  Other Topics Concern  . Not on file  Social History Narrative  . Not on file     Family History:  The patient's family history includes Cancer in his paternal grandfather; Diabetes in his mother and paternal grandmother; Heart attack in his father; Heart disease in his father; Hypertension in his mother.   ROS:   Please see the history of present illness.    Severe bilateral knee discomfort.  Prior bilateral knee replacements.  History of gastroesophageal reflux. All other systems reviewed and are negative.   PHYSICAL EXAM:   VS: Blood pressure 158/78, heart rate 76 bpm, respirations 14 and nonlabored. GEN: Well nourished, well developed, in no acute distress  HEENT: normal  Neck: no JVD, carotid bruits, or masses Cardiac: RRR; no murmurs, rubs, or edema. Respiratory:  clear to auscultation bilaterally, normal work of breathing. GI: soft, nontender, nondistended, + BS MS: no deformity or atrophy.  Deformed bilateral knees secondary to prior replacements in the setting of osteoarthritis. Skin: warm and dry, no rash Neuro:  Alert and Oriented x 3, Strength and sensation are intact Psych: euthymic mood, full affect  Wt Readings from Last 3 Encounters:  12/27/16 198 lb 12.8 oz (90.2 kg)  08/01/16 195 lb (88.5 kg)  06/28/16 195 lb (88.5 kg)      Studies/Labs Reviewed:   EKG:  EKG normal sinus rhythm, heart rate 76 bpm, poor R wave progression   Recent Labs: 12/27/2016: ALT 17; BUN 24; Creatinine, Ser 1.26; Hemoglobin 14.1; Platelets 284.0; Potassium 4.3; Sodium 138; TSH 3.69   Lipid Panel    Component Value Date/Time   CHOL 135 12/27/2016 0854   TRIG 108.0 12/27/2016 0854   HDL 45.90 12/27/2016 0854   CHOLHDL 3 12/27/2016 0854   VLDL 21.6 12/27/2016 0854     LDLCALC 67 12/27/2016 0854    Additional studies/ records that were reviewed today include:   Exercise treadmill test done as part of a stress echo study-01/23/2017: Patient exercised on the Bruce protocol as part of a stress echo.  He started with a normal EKG.  He achieved peak heart rate of 160 bpm which was 104% of predicted heart rate.  He began developing diagnostic ST segment changes by 5 minutes of exercise (around 2 minutes of stage II).  No symptoms developed at that time.  End stage III he developed dyspnea, the test was stopped as the target heart rate worse surpassed.  Total workload 8.2 METs.  Diagnostic ST segment changes of ischemia and involving  the inferolateral leads II, 3, aVF, the L4 through V6.  Echo results are pending.  ASSESSMENT:    1. Chest discomfort   2. Abnormal stress test   3. Mixed hyperlipidemia   4. Type 2 diabetes mellitus with complication, without long-term current use of insulin (HCC)      PLAN:  In order of problems listed above:  1. Chest discomfort is highly likely to represent ischemic pain given the positive ST abnormalities noted the stress ECG.  Echo results are pending.  Given complaints and abnormalities on the stress test the patient needs to have coronary angiography.  I have recommended that he undergo this study and have talked to him concerning the risks and benefits.  I have asked him to continue to take an aspirin per day.  Symptoms have been ongoing for at least 6 months.  Prolonged chest discomfort not relieved by rest should prompt immediate emergency room visit by EMS.  He did not commit to coronary angiography until he has an opportunity to speak with his daughter who is a Psychologist, counselling with Dr. Quay Burow.         The patient was counseled to undergo left heart catheterization, coronary angiography, and possible percutaneous coronary intervention with stent implantation. The procedural risks and benefits were discussed in detail.  The risks discussed included death, stroke, myocardial infarction, life-threatening bleeding, limb ischemia, kidney injury, allergy, and possible emergency cardiac surgery. The risk of these significant complications were estimated to occur less than 1% of the time. After discussion, the patient has agreed to proceed.    Medication Adjustments/Labs and Tests Ordered: Current medicines are reviewed at length with the patient today.  Concerns regarding medicines are outlined above.  Medication changes, Labs and Tests ordered today are listed in the Patient Instructions below. Patient Instructions  Medication Instructions:  Your physician recommends that you continue on your current medications as directed. Please refer to the Current Medication list given to you today.  Labwork: You will need lab work prior to your cath.  Testing/Procedures: Your physician has requested that you have a cardiac catheterization. Cardiac catheterization is used to diagnose and/or treat various heart conditions. Doctors may recommend this procedure for a number of different reasons. The most common reason is to evaluate chest pain. Chest pain can be a symptom of coronary artery disease (CAD), and cardiac catheterization can show whether plaque is narrowing or blocking your heart's arteries. This procedure is also used to evaluate the valves, as well as measure the blood flow and oxygen levels in different parts of your heart. For further information please visit HugeFiesta.[email or web address]. Please follow instruction sheet, as given.    Follow-Up: Your physician recommends that you schedule a follow-up appointment 2-3 weeks after heart cath.    Any Other Special Instructions Will Be Listed Below (If Applicable).  Speak with your family and contact us about getting your heart cath.  Below are the dates that Dr. Tamala Julian will be back in the cath lab.  1/29 1/30   If you need a refill on your cardiac medications before your  next appointment, please call your pharmacy.      Signed, Sinclair Grooms, MD  01/23/2017 5:50 PM    [facility] [place], [place],   [address] Phone: [phone]; Fax: [phone]

## 2017-01-23 NOTE — Patient Instructions (Signed)
Medication Instructions:  Your physician recommends that you continue on your current medications as directed. Please refer to the Current Medication list given to you today.  Labwork: You will need lab work prior to your cath.  Testing/Procedures: Your physician has requested that you have a cardiac catheterization. Cardiac catheterization is used to diagnose and/or treat various heart conditions. Doctors may recommend this procedure for a number of different reasons. The most common reason is to evaluate chest pain. Chest pain can be a symptom of coronary artery disease (CAD), and cardiac catheterization can show whether plaque is narrowing or blocking your heart's arteries. This procedure is also used to evaluate the valves, as well as measure the blood flow and oxygen levels in different parts of your heart. For further information please visit HugeFiesta.[email or web address]. Please follow instruction sheet, as given.    Follow-Up: Your physician recommends that you schedule a follow-up appointment 2-3 weeks after heart cath.    Any Other Special Instructions Will Be Listed Below (If Applicable).  Speak with your family and contact us about getting your heart cath.  Below are the dates that Dr. Tamala Julian will be back in the cath lab.  1/29 1/30   If you need a refill on your cardiac medications before your next appointment, please call your pharmacy.

## 2017-01-24 ENCOUNTER — Telehealth: Payer: Self-pay | Admitting: Interventional Cardiology

## 2017-01-24 ENCOUNTER — Encounter: Payer: Self-pay | Admitting: *Deleted

## 2017-01-24 ENCOUNTER — Encounter: Payer: Self-pay | Admitting: Internal Medicine

## 2017-01-24 DIAGNOSIS — R0789 Other chest pain: Secondary | ICD-10-CM

## 2017-01-24 DIAGNOSIS — R9439 Abnormal result of other cardiovascular function study: Secondary | ICD-10-CM

## 2017-01-24 DIAGNOSIS — Z01812 Encounter for preprocedural laboratory examination: Secondary | ICD-10-CM

## 2017-01-24 NOTE — Telephone Encounter (Signed)
New Message  Pt call requesting to speak with RN. Pt would like to schedule cath procedure for 1/29.

## 2017-01-24 NOTE — Telephone Encounter (Signed)
This encounter was created in error - please disregard.

## 2017-01-24 NOTE — Telephone Encounter (Signed)
Spoke with pt and made him aware that I will contact cath lab and get him scheduled and then contact him with instructions.  Pt verbalized understanding and was appreciative for call.   Called cath lab and scheduled pt for 1st case on 1/29.  Will contact pt with instructions later today.

## 2017-01-24 NOTE — Telephone Encounter (Signed)
Spoke with pt and went over cath instructions.  Scheduled labs for 01/30/17 and advised pt to pick up letter of instruction while he is here.  Pt verbalized understanding and was in agreement with this plan.

## 2017-01-24 NOTE — Addendum Note (Signed)
Addended by: Binnie Rail on: 01/24/2017 08:25 PM   Modules accepted: Level of Service, SmartSet

## 2017-01-30 ENCOUNTER — Other Ambulatory Visit: Payer: 59

## 2017-01-30 DIAGNOSIS — Z01812 Encounter for preprocedural laboratory examination: Secondary | ICD-10-CM

## 2017-01-30 DIAGNOSIS — R9439 Abnormal result of other cardiovascular function study: Secondary | ICD-10-CM

## 2017-01-30 DIAGNOSIS — R0789 Other chest pain: Secondary | ICD-10-CM

## 2017-01-31 LAB — BASIC METABOLIC PANEL
BUN / CREAT RATIO: 23 (ref 10–24)
BUN: 25 mg/dL (ref 8–27)
CHLORIDE: 97 mmol/L (ref 96–106)
CO2: 24 mmol/L (ref 20–29)
Calcium: 9.6 mg/dL (ref 8.6–10.2)
Creatinine, Ser: 1.09 mg/dL (ref 0.76–1.27)
GFR calc Af Amer: 82 mL/min/{1.73_m2} (ref >59)
GFR calc non Af Amer: 71 mL/min/{1.73_m2} (ref >59)
GLUCOSE: 121 mg/dL — AB (ref 65–99)
POTASSIUM: 4.5 mmol/L (ref 3.5–5.2)
SODIUM: 139 mmol/L (ref 134–144)

## 2017-01-31 LAB — PROTIME-INR
INR: 1 (ref 0.8–1.2)
PROTHROMBIN TIME: 10.3 s (ref 9.1–12.0)

## 2017-01-31 LAB — CBC
Hematocrit: 37.6 % (ref 37.5–51.0)
Hemoglobin: 12.8 g/dL — ABNORMAL LOW (ref 13.0–17.7)
MCH: 29.2 pg (ref 26.6–33.0)
MCHC: 34 g/dL (ref 31.5–35.7)
MCV: 86 fL (ref 79–97)
Platelets: 315 10*3/uL (ref 150–379)
RBC: 4.38 x10E6/uL (ref 4.14–5.80)
RDW: 13.7 % (ref 12.3–15.4)
WBC: 9.1 10*3/uL (ref 3.4–10.8)

## 2017-02-05 ENCOUNTER — Telehealth: Payer: Self-pay | Admitting: *Deleted

## 2017-02-05 NOTE — Telephone Encounter (Signed)
Patient contacted pre-catheterization at [facility] scheduled for: 02/06/17 7:30 AM  Verified arrival time and place: [facility] NT/Main A/5:30 AM.  Confirmed AM meds to be taken pre-cath with sip of water:  ASA 81 mg am of   Hold:  Metformin 02/05/17 and for 48 hours Losartan/HCTZ am of   Confirmed patient has responsible person to drive home post procedure and observe patient for 24 hours: yes

## 2017-02-06 ENCOUNTER — Encounter (HOSPITAL_COMMUNITY): Payer: Self-pay | Admitting: Interventional Cardiology

## 2017-02-06 ENCOUNTER — Ambulatory Visit (HOSPITAL_COMMUNITY)
Admission: RE | Admit: 2017-02-06 | Discharge: 2017-02-06 | Disposition: A | Discharge status: Home / Self Care | Payer: 59 | Source: Ambulatory Visit | Attending: Interventional Cardiology | Admitting: Interventional Cardiology

## 2017-02-06 ENCOUNTER — Ambulatory Visit (HOSPITAL_COMMUNITY)
Admission: RE | Disposition: A | Discharge status: Home / Self Care | Payer: Self-pay | Source: Ambulatory Visit | Attending: Interventional Cardiology

## 2017-02-06 DIAGNOSIS — Z981 Arthrodesis status: Secondary | ICD-10-CM | POA: Diagnosis not present

## 2017-02-06 DIAGNOSIS — Z885 Allergy status to narcotic agent status: Secondary | ICD-10-CM | POA: Diagnosis not present

## 2017-02-06 DIAGNOSIS — E119 Type 2 diabetes mellitus without complications: Secondary | ICD-10-CM | POA: Diagnosis not present

## 2017-02-06 DIAGNOSIS — I2510 Atherosclerotic heart disease of native coronary artery without angina pectoris: Secondary | ICD-10-CM | POA: Insufficient documentation

## 2017-02-06 DIAGNOSIS — I10 Essential (primary) hypertension: Secondary | ICD-10-CM | POA: Diagnosis not present

## 2017-02-06 DIAGNOSIS — Z833 Family history of diabetes mellitus: Secondary | ICD-10-CM | POA: Insufficient documentation

## 2017-02-06 DIAGNOSIS — Z9889 Other specified postprocedural states: Secondary | ICD-10-CM | POA: Insufficient documentation

## 2017-02-06 DIAGNOSIS — M1990 Unspecified osteoarthritis, unspecified site: Secondary | ICD-10-CM | POA: Insufficient documentation

## 2017-02-06 DIAGNOSIS — R9439 Abnormal result of other cardiovascular function study: Secondary | ICD-10-CM

## 2017-02-06 DIAGNOSIS — Z79899 Other long term (current) drug therapy: Secondary | ICD-10-CM | POA: Diagnosis not present

## 2017-02-06 DIAGNOSIS — K5732 Diverticulitis of large intestine without perforation or abscess without bleeding: Secondary | ICD-10-CM | POA: Insufficient documentation

## 2017-02-06 DIAGNOSIS — Z7984 Long term (current) use of oral hypoglycemic drugs: Secondary | ICD-10-CM | POA: Diagnosis not present

## 2017-02-06 DIAGNOSIS — Z87891 Personal history of nicotine dependence: Secondary | ICD-10-CM | POA: Insufficient documentation

## 2017-02-06 DIAGNOSIS — Z7982 Long term (current) use of aspirin: Secondary | ICD-10-CM | POA: Insufficient documentation

## 2017-02-06 DIAGNOSIS — Z8249 Family history of ischemic heart disease and other diseases of the circulatory system: Secondary | ICD-10-CM | POA: Diagnosis not present

## 2017-02-06 DIAGNOSIS — E785 Hyperlipidemia, unspecified: Secondary | ICD-10-CM | POA: Diagnosis present

## 2017-02-06 DIAGNOSIS — I25118 Atherosclerotic heart disease of native coronary artery with other forms of angina pectoris: Secondary | ICD-10-CM | POA: Diagnosis not present

## 2017-02-06 DIAGNOSIS — E118 Type 2 diabetes mellitus with unspecified complications: Secondary | ICD-10-CM | POA: Diagnosis present

## 2017-02-06 DIAGNOSIS — K219 Gastro-esophageal reflux disease without esophagitis: Secondary | ICD-10-CM | POA: Insufficient documentation

## 2017-02-06 DIAGNOSIS — E1122 Type 2 diabetes mellitus with diabetic chronic kidney disease: Secondary | ICD-10-CM | POA: Diagnosis present

## 2017-02-06 DIAGNOSIS — R0789 Other chest pain: Secondary | ICD-10-CM | POA: Diagnosis present

## 2017-02-06 DIAGNOSIS — E1169 Type 2 diabetes mellitus with other specified complication: Secondary | ICD-10-CM | POA: Diagnosis present

## 2017-02-06 DIAGNOSIS — Z96653 Presence of artificial knee joint, bilateral: Secondary | ICD-10-CM | POA: Diagnosis not present

## 2017-02-06 DIAGNOSIS — E782 Mixed hyperlipidemia: Secondary | ICD-10-CM | POA: Diagnosis not present

## 2017-02-06 DIAGNOSIS — E039 Hypothyroidism, unspecified: Secondary | ICD-10-CM | POA: Diagnosis not present

## 2017-02-06 HISTORY — PX: LEFT HEART CATH AND CORONARY ANGIOGRAPHY: CATH118249

## 2017-02-06 LAB — GLUCOSE, CAPILLARY
GLUCOSE-CAPILLARY: 118 mg/dL — AB (ref 65–99)
Glucose-Capillary: 128 mg/dL — ABNORMAL HIGH (ref 65–99)

## 2017-02-06 SURGERY — LEFT HEART CATH AND CORONARY ANGIOGRAPHY
Anesthesia: LOCAL

## 2017-02-06 MED ORDER — ASPIRIN 81 MG PO CHEW: 81.0000 mg | CHEWABLE_TABLET | ORAL | Status: DC

## 2017-02-06 MED ORDER — SODIUM CHLORIDE 0.9 % IV SOLN: INTRAVENOUS | Status: AC

## 2017-02-06 MED ORDER — VERAPAMIL HCL 2.5 MG/ML IV SOLN
INTRAVENOUS | Status: AC
  Filled 2017-02-06: qty 2

## 2017-02-06 MED ORDER — SODIUM CHLORIDE 0.9 % IV SOLN: 250.0000 mL | INTRAVENOUS | Status: DC | PRN

## 2017-02-06 MED ORDER — VERAPAMIL HCL 2.5 MG/ML IV SOLN
INTRAVENOUS | Status: DC | PRN
  Administered 2017-02-06: 10 mL via INTRA_ARTERIAL

## 2017-02-06 MED ORDER — ASPIRIN 81 MG PO CHEW: 81.0000 mg | CHEWABLE_TABLET | Freq: Every day | ORAL | Status: DC

## 2017-02-06 MED ORDER — HEPARIN SODIUM (PORCINE) 1000 UNIT/ML IJ SOLN
INTRAMUSCULAR | Status: DC | PRN
  Administered 2017-02-06: 4500 [IU] via INTRAVENOUS

## 2017-02-06 MED ORDER — HEPARIN (PORCINE) IN NACL 2-0.9 UNIT/ML-% IJ SOLN
INTRAMUSCULAR | Status: AC
  Filled 2017-02-06: qty 1000

## 2017-02-06 MED ORDER — FENTANYL CITRATE (PF) 100 MCG/2ML IJ SOLN
INTRAMUSCULAR | Status: DC | PRN
  Administered 2017-02-06: 50 ug via INTRAVENOUS

## 2017-02-06 MED ORDER — HEPARIN SODIUM (PORCINE) 1000 UNIT/ML IJ SOLN
INTRAMUSCULAR | Status: AC
  Filled 2017-02-06: qty 1

## 2017-02-06 MED ORDER — SODIUM CHLORIDE 0.9% FLUSH: 3.0000 mL | Freq: Two times a day (BID) | INTRAVENOUS | Status: DC

## 2017-02-06 MED ORDER — ONDANSETRON HCL 4 MG/2ML IJ SOLN: 4.0000 mg | Freq: Four times a day (QID) | INTRAMUSCULAR | Status: DC | PRN

## 2017-02-06 MED ORDER — SODIUM CHLORIDE 0.9 % WEIGHT BASED INFUSION: 1.0000 mL/kg/h | INTRAVENOUS | Status: DC

## 2017-02-06 MED ORDER — MIDAZOLAM HCL 2 MG/2ML IJ SOLN
INTRAMUSCULAR | Status: AC
  Filled 2017-02-06: qty 2

## 2017-02-06 MED ORDER — MIDAZOLAM HCL 2 MG/2ML IJ SOLN
INTRAMUSCULAR | Status: DC | PRN
  Administered 2017-02-06 (×2): 1 mg via INTRAVENOUS

## 2017-02-06 MED ORDER — HEPARIN (PORCINE) IN NACL 2-0.9 UNIT/ML-% IJ SOLN
INTRAMUSCULAR | Status: AC | PRN
  Administered 2017-02-06: 1000 mL via INTRA_ARTERIAL

## 2017-02-06 MED ORDER — IOPAMIDOL (ISOVUE-370) INJECTION 76%
INTRAVENOUS | Status: DC | PRN
  Administered 2017-02-06: 90 mL via INTRA_ARTERIAL

## 2017-02-06 MED ORDER — IOPAMIDOL (ISOVUE-370) INJECTION 76%
INTRAVENOUS | Status: AC
  Filled 2017-02-06: qty 100

## 2017-02-06 MED ORDER — SODIUM CHLORIDE 0.9% FLUSH: 3.0000 mL | INTRAVENOUS | Status: DC | PRN

## 2017-02-06 MED ORDER — LIDOCAINE HCL (PF) 1 % IJ SOLN
INTRAMUSCULAR | Status: DC | PRN
  Administered 2017-02-06: 2 mL via INTRADERMAL

## 2017-02-06 MED ORDER — SODIUM CHLORIDE 0.9 % WEIGHT BASED INFUSION
3.0000 mL/kg/h | INTRAVENOUS | Status: AC
  Administered 2017-02-06: 3 mL/kg/h via INTRAVENOUS

## 2017-02-06 MED ORDER — FENTANYL CITRATE (PF) 100 MCG/2ML IJ SOLN
INTRAMUSCULAR | Status: AC
  Filled 2017-02-06: qty 2

## 2017-02-06 MED ORDER — LIDOCAINE HCL 1 % IJ SOLN
INTRAMUSCULAR | Status: AC
  Filled 2017-02-06: qty 20

## 2017-02-06 MED ORDER — ACETAMINOPHEN 325 MG PO TABS: 650.0000 mg | ORAL_TABLET | ORAL | Status: DC | PRN

## 2017-02-06 SURGICAL SUPPLY — 12 items
CATH INFINITI 5 FR JL3.5 (CATHETERS) ×1 IMPLANT
CATH INFINITI JR4 5F (CATHETERS) ×1 IMPLANT
COVER PRB 48X5XTLSCP FOLD TPE (BAG) IMPLANT
COVER PROBE 5X48 (BAG) ×2
DEVICE RAD COMP TR BAND LRG (VASCULAR PRODUCTS) ×1 IMPLANT
GLIDESHEATH SLEND A-KIT 6F 22G (SHEATH) ×1 IMPLANT
GUIDEWIRE INQWIRE 1.5J.035X260 (WIRE) IMPLANT
INQWIRE 1.5J .035X260CM (WIRE) ×2
KIT HEART LEFT (KITS) ×2 IMPLANT
PACK CARDIAC CATHETERIZATION (CUSTOM PROCEDURE TRAY) ×2 IMPLANT
TRANSDUCER W/STOPCOCK (MISCELLANEOUS) ×2 IMPLANT
TUBING CIL FLEX 10 FLL-RA (TUBING) ×2 IMPLANT

## 2017-02-06 NOTE — Research (Signed)
CAD FEM Informed Consent   Subject Name: [name]  Subject met inclusion and exclusion criteria.  The informed consent form, study requirements and expectations were reviewed with the subject and questions and concerns were addressed prior to the signing of the consent form.  The subject verbalized understanding of the trail requirements.  The subject agreed to participate in the CAD FEM trial and signed the informed consent.  The informed consent was obtained prior to performance of any protocol-specific procedures for the subject.  A copy of the signed informed consent was given to the subject and a copy was placed in the subject's medical record.  Hedrick,Adit Riddles W 02/06/2017, [id number]

## 2017-02-06 NOTE — Interval H&P Note (Signed)
Cath Lab Visit (complete for each Cath Lab visit)  Clinical Evaluation Leading to the Procedure:   ACS: Yes.    Non-ACS:    Anginal Classification: CCS III  Anti-ischemic medical therapy: Minimal Therapy (1 class of medications)  Non-Invasive Test Results: High-risk stress test findings: cardiac mortality >3%/year  Prior CABG: No previous CABG      History and Physical Interval Note:  02/06/2017 7:37 AM  [name]  has presented today for surgery, with the diagnosis of abnormal nuclear stress test  The various methods of treatment have been discussed with the patient and family. After consideration of risks, benefits and other options for treatment, the patient has consented to  Procedure(s): LEFT HEART CATH AND CORONARY ANGIOGRAPHY (N/A) as a surgical intervention .  The patient's history has been reviewed, patient examined, no change in status, stable for surgery.  I have reviewed the patient's chart and labs.  Questions were answered to the patient's satisfaction.     Belva Crome III

## 2017-02-06 NOTE — Discharge Instructions (Signed)
NO METFORMIN/GLUCOPHAGE FOR 2 DAYS ° ° °Radial Site Care °Refer to this sheet in the next few weeks. These instructions provide you with information about caring for yourself after your procedure. Your health care provider may also give you more specific instructions. Your treatment has been planned according to current medical practices, but problems sometimes occur. Call your health care provider if you have any problems or questions after your procedure. °What can I expect after the procedure? °After your procedure, it is typical to have the following: °· Bruising at the radial site that usually fades within 1-2 weeks. °· Blood collecting in the tissue (hematoma) that may be painful to the touch. It should usually decrease in size and tenderness within 1-2 weeks. ° °Follow these instructions at home: °· Take medicines only as directed by your health care provider. °· You may shower 24-48 hours after the procedure or as directed by your health care provider. Remove the bandage (dressing) and gently wash the site with plain soap and water. Pat the area dry with a clean towel. Do not rub the site, because this may cause bleeding. °· Do not take baths, swim, or use a hot tub until your health care provider approves. °· Check your insertion site every day for redness, swelling, or drainage. °· Do not apply powder or lotion to the site. °· Do not flex or bend the affected arm for 24 hours or as directed by your health care provider. °· Do not push or pull heavy objects with the affected arm for 24 hours or as directed by your health care provider. °· Do not lift over 10 lb (4.5 kg) for 5 days after your procedure or as directed by your health care provider. °· Ask your health care provider when it is okay to: °? Return to work or school. °? Resume usual physical activities or sports. °? Resume sexual activity. °· Do not drive home if you are discharged the same day as the procedure. Have someone else drive you. °· You  may drive 24 hours after the procedure unless otherwise instructed by your health care provider. °· Do not operate machinery or power tools for 24 hours after the procedure. °· If your procedure was done as an outpatient procedure, which means that you went home the same day as your procedure, a responsible adult should be with you for the first 24 hours after you arrive home. °· Keep all follow-up visits as directed by your health care provider. This is important. °Contact a health care provider if: °· You have a fever. °· You have chills. °· You have increased bleeding from the radial site. Hold pressure on the site. °Get help right away if: °· You have unusual pain at the radial site. °· You have redness, warmth, or swelling at the radial site. °· You have drainage (other than a small amount of blood on the dressing) from the radial site. °· The radial site is bleeding, and the bleeding does not stop after 30 minutes of holding steady pressure on the site. °· Your arm or hand becomes pale, cool, tingly, or numb. °This information is not intended to replace advice given to you by your health care provider. Make sure you discuss any questions you have with your health care provider. °Document Released: 01/28/2010 Document Revised: 06/03/2015 Document Reviewed: 07/14/2013 °Elsevier Interactive Patient Education © 2018 [employer] Inc[employer] ° °

## 2017-02-07 MED FILL — Lidocaine HCl Local Inj 1%: INTRAMUSCULAR | Qty: 20 | Status: AC

## 2017-02-16 ENCOUNTER — Ambulatory Visit (INDEPENDENT_AMBULATORY_CARE_PROVIDER_SITE_OTHER): Payer: 59 | Admitting: Family

## 2017-02-16 ENCOUNTER — Encounter: Payer: Self-pay | Admitting: Family

## 2017-02-16 VITALS — BP 104/80 | HR 71 | Temp 98.1°F | Ht 69.0 in | Wt 198.0 lb

## 2017-02-16 DIAGNOSIS — J019 Acute sinusitis, unspecified: Secondary | ICD-10-CM

## 2017-02-16 MED ORDER — AMOXICILLIN-POT CLAVULANATE 875-125 MG PO TABS: 1.0000 | ORAL_TABLET | Freq: Two times a day (BID) | ORAL | 0 refills | Status: DC

## 2017-02-16 NOTE — Progress Notes (Signed)
[name] is a 67 y.o. male with the following history as recorded in [employer]:  Patient Active Problem List   Diagnosis Date Noted  . Abnormal nuclear stress test 01/23/2017  . Chest discomfort 01/23/2017  . Intermittent chest pain 12/27/2016  . Type 2 diabetes mellitus with complication, without long-term current use of insulin ([place]) 06/22/2016  . Hypothyroidism 06/22/2016  . Hyperlipidemia 06/22/2016  . GERD (gastroesophageal reflux disease) 06/22/2016  . Status post cervical spinal fusion 05/23/2016  . Cervical spinal stenosis 04/05/2016  . Pain in right wrist 03/07/2016  . Neck pain 12/01/2015  . Other spondylosis with radiculopathy, cervical region 12/01/2015  . Diverticulitis of colon with perforation 10/20/2010    Current Outpatient Medications  Medication Sig Dispense Refill  . Ascorbic Acid (VITAMIN C WITH ROSE HIPS) 500 MG tablet Take 1,000 mg by mouth daily.    Marland Kitchen aspirin EC 81 MG tablet Take 81 mg by mouth daily.    . B Complex-C (SUPER B COMPLEX PO) Take 1 tablet by mouth daily.    . Calcium Carbonate-Vitamin D (CALCIUM-VITAMIN D3 PO) Take 1 tablet by mouth every other day.    . Cholecalciferol (VITAMIN D3) 1000 units CAPS Take 1,000 Units by mouth daily.    . Garlic 1062 MG CAPS Take 1,000 mg by mouth every other day.    . levothyroxine (SYNTHROID, LEVOTHROID) 100 MCG tablet Take 1 tablet (100 mcg total) by mouth daily before breakfast. 90 tablet 3  . losartan-hydrochlorothiazide (HYZAAR) 100-12.5 MG tablet Take 1 tablet by mouth daily after breakfast. 90 tablet 3  . Magnesium 100 MG TABS Take 100 mg by mouth every other day.    . metFORMIN (GLUCOPHAGE-XR) 500 MG 24 hr tablet Take 2 tablets (1,000 mg total) by mouth at bedtime. 180 tablet 3  . methocarbamol (ROBAXIN) 500 MG tablet Take 1 tablet (500 mg total) by mouth every 8 (eight) hours as needed for muscle spasms. 40 tablet 0  . Multiple Vitamins-Minerals (MULTIVITAMIN ADULTS 50+ PO) Take 1 tablet by mouth  daily.    . Omega-3 Fatty Acids (OMEGA 3 PO) Take 3 capsules by mouth daily.    . pantoprazole (PROTONIX) 20 MG tablet Take 1 tablet (20 mg total) by mouth daily after breakfast. 90 tablet 3  . vitamin E 400 UNIT capsule Take 400 Units by mouth every other day.    Marland Kitchen amoxicillin-clavulanate (AUGMENTIN) 875-125 MG tablet Take 1 tablet by mouth 2 (two) times daily. 20 tablet 0   No current facility-administered medications for this visit.     Allergies: Morphine  Past Medical History:  Diagnosis Date  . Arthritis   . Diabetes mellitus without complication ([place])   . Diverticulitis of colon   . GERD (gastroesophageal reflux disease)   . Headache   . Hypertension   . Hypothyroidism   . Thyroid disease     Past Surgical History:  Procedure Laterality Date  . ANKLE FRACTURE SURGERY    . ANTERIOR CERVICAL DECOMP/DISCECTOMY FUSION N/A 04/05/2016   Procedure: C5-6, C6-7 Anterior Cervical Discectomy and Fusion, Allograft, Plate;  Surgeon: Marybelle Killings, MD;  Location: [facility];  Service: Orthopedics;  Laterality: N/A;  . ANTERIOR FUSION CERVICAL SPINE  04/05/2016   c6 c7  . CARPAL TUNNEL RELEASE Bilateral   . 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 JOINT REPLACEMENT  11/10   right knee replaced   . KNEE SURGERY    . LEFT HEART CATH AND CORONARY ANGIOGRAPHY N/A  02/06/2017   Procedure: LEFT HEART CATH AND CORONARY ANGIOGRAPHY;  Surgeon: Belva Crome, MD;  Location: [facility];  Service: Cardiovascular;  Laterality: N/A;  . LEG SURGERY  11/03   broken lower right leg crushed     Family History  Problem Relation Age of Onset  . Hypertension Mother   . Diabetes Mother   . Heart attack Father   . Heart disease Father   . Diabetes Paternal Grandmother   . Cancer Paternal Grandfather        of eye    Social History   Tobacco Use  . Smoking status: Former Smoker    Types: Cigarettes    Last attempt to quit: 01/10/2008    Years since quitting: 9.1  . Smokeless tobacco:  Former Systems developer    Types: Chew  Substance Use Topics  . Alcohol use: Yes    Comment: occasionally    Subjective:  Patient presents with concerns for possible sinus infection; + frontal headache; no fever; + thick, colored mucus; no chest pain, no shortness of breath; using Nasacort with some benefit; symptoms present x 1 week;   Objective:  Vitals:   02/16/17 1324  BP: 104/80  Pulse: 71  Temp: 98.1 F (36.7 C)  TempSrc: Oral  SpO2: 98%  Weight: 198 lb (89.8 kg)  Height: 5\' 9"  (1.753 m)    General: Well developed, well nourished, in no acute distress  Skin : Warm and dry.  Head: Normocephalic and atraumatic  Eyes: Sclera and conjunctiva clear; pupils round and reactive to light; extraocular movements intact  Ears: External normal; canals clear; tympanic membranes congested bilaterally Oropharynx: Pink, supple. No suspicious lesions  Neck: Supple without thyromegaly, adenopathy  Lungs: Respirations unlabored; clear to auscultation bilaterally without wheeze, rales, rhonchi  CVS exam: normal rate and regular rhythm.  Neurologic: Alert and oriented; speech intact; face symmetrical; moves all extremities well; CNII-XII intact without focal deficit  Assessment:  1. Acute sinusitis, recurrence not specified, unspecified location     Plan:  Rx for Augmentin 875 mg bid x 10 days; continue Nasacort AQ; increase fluids, rest and follow-up worse, no better.   No Follow-up on file.  No orders of the defined types were placed in this encounter.   Requested Prescriptions   Signed Prescriptions Disp Refills  . amoxicillin-clavulanate (AUGMENTIN) 875-125 MG tablet 20 tablet 0    Sig: Take 1 tablet by mouth 2 (two) times daily.

## 2017-03-02 ENCOUNTER — Ambulatory Visit: Payer: 59 | Admitting: Interventional Cardiology

## 2017-03-12 ENCOUNTER — Other Ambulatory Visit: Payer: Self-pay | Admitting: Internal Medicine

## 2017-03-12 DIAGNOSIS — R5383 Other fatigue: Secondary | ICD-10-CM

## 2017-03-12 DIAGNOSIS — R0683 Snoring: Secondary | ICD-10-CM

## 2017-03-29 ENCOUNTER — Telehealth: Payer: Self-pay | Admitting: Internal Medicine

## 2017-03-29 DIAGNOSIS — R0683 Snoring: Secondary | ICD-10-CM

## 2017-03-29 NOTE — Telephone Encounter (Signed)
Talk to [facility] about this.

## 2017-03-29 NOTE — Telephone Encounter (Signed)
Copied from [place] [id number]. Topic: Quick Communication - See Telephone Encounter >> Mar 29, 2017  9:30 AM Lolita Rieger, RMA wrote: CRM for notification. See Telephone encounter for: 03/29/17.insurance denied in lab sleep study but will cover in home study just need another order placed call [phone] if you have any further questions

## 2017-03-29 NOTE — Telephone Encounter (Signed)
Referral ordered for pulmonary-they will arrange sleep study

## 2017-04-03 ENCOUNTER — Ambulatory Visit (INDEPENDENT_AMBULATORY_CARE_PROVIDER_SITE_OTHER): Payer: 59 | Admitting: Orthopaedic Surgery

## 2017-04-03 ENCOUNTER — Ambulatory Visit (INDEPENDENT_AMBULATORY_CARE_PROVIDER_SITE_OTHER): Payer: 59

## 2017-04-03 ENCOUNTER — Encounter (INDEPENDENT_AMBULATORY_CARE_PROVIDER_SITE_OTHER): Payer: Self-pay | Admitting: Orthopaedic Surgery

## 2017-04-03 VITALS — BP 118/64 | HR 74 | Ht 69.0 in | Wt 200.0 lb

## 2017-04-03 DIAGNOSIS — M19071 Primary osteoarthritis, right ankle and foot: Secondary | ICD-10-CM | POA: Diagnosis not present

## 2017-04-03 DIAGNOSIS — M79671 Pain in right foot: Secondary | ICD-10-CM

## 2017-04-03 DIAGNOSIS — M79672 Pain in left foot: Secondary | ICD-10-CM | POA: Diagnosis not present

## 2017-04-03 DIAGNOSIS — M19072 Primary osteoarthritis, left ankle and foot: Secondary | ICD-10-CM

## 2017-04-03 DIAGNOSIS — M2550 Pain in unspecified joint: Secondary | ICD-10-CM

## 2017-04-03 NOTE — Progress Notes (Signed)
Office Visit Note   Patient: [name]           Date of Birth: May 06, 1950           MRN: [id number] Visit Date: 04/03/2017              Requested by: Binnie Rail, MD [place], [place] [address] PCP: Binnie Rail, MD   Assessment & Plan: Visit Diagnoses:  1. Bilateral foot pain   2. Polyarthralgia   3. Arthritis of first metatarsophalangeal (MTP) joint of right foot   4. Arthritis of first metatarsophalangeal (MTP) joint of left foot     Plan: With patient's bilateral great toe pain and deformity recommend him seeing Dr. Meridee Score who has operated on him in the past.  Patient states that he is open to have something done to fix his ongoing problem and this will likely involve a fairly extensive surgical correction.  I do not think conservative management with injections would be of any great benefit to him.  With his significant degenerative changes that he has at the bilateral first MTP joints and also with the polyarthralgias that he describes in his wrist and hands labs were drawn today to check a CBC and arthritis panel.  All questions answered.  Follow-Up Instructions: Return in 9 days (on 04/12/2017) for With Dr. Sharol Given 845am.   Orders:  Orders Placed This Encounter  Procedures  . XR Foot Complete Left  . XR Foot Complete Right  . CBC  . Uric acid  . Rheumatoid Factor  . Antinuclear Antib (ANA)  . Sed Rate (ESR)   No orders of the defined types were placed in this encounter.     Procedures: No procedures performed   Clinical Data: No additional findings.   Subjective: Chief Complaint  Patient presents with  . Left Foot - Pain  . Right Foot - Pain    HPI Patient comes in today with complaints of left greater than right great toe pain.  He states that he has had pain in both feet for several years but this is been progressively getting worse.  Bilateral hallux valgus deformities with swelling and a "knot" at the first MTP joint.  He  feels like he is losing motion at both joints.  He has to wear shoes that are wide to help prevent foot pain.  States that he  also has to stretch his shoes when he gets new ones.  He had bunion surgery right foot about 10 years ago in [place].  Patient denies history of gout, rheumatoid arthritis, lupus. Review of Systems No current cardiac pulmonary GI GU issues  Objective: Vital Signs: BP 118/64   Pulse 74   Ht 5' 9"  (1.753 m)   Wt 200 lb (90.7 kg)   BMI 29.53 kg/m   Physical Exam  Constitutional: He is oriented to person, place, and time. No distress.  HENT:  Head: Normocephalic and atraumatic.  Eyes: Pupils are equal, round, and reactive to light.  Pulmonary/Chest: No respiratory distress.  Abdominal: He exhibits no distension.  Musculoskeletal:  Gait is antalgic.  Bilateral feet he has hallux rigidus.  Large palpable spurs bilateral great toes.  Exquisitely tender over the first MTP joints.  Gait antalgic.  Bilateral hands he does have some swelling and mild soreness over the MCP joints.  Neurological: He is alert and oriented to person, place, and time.  Skin: Skin is warm and dry.    Ortho Exam  Specialty Comments:  No specialty comments available.  Imaging: No results found.   PMFS History: Patient Active Problem List   Diagnosis Date Noted  . Abnormal nuclear stress test 01/23/2017  . Chest discomfort 01/23/2017  . Intermittent chest pain 12/27/2016  . Type 2 diabetes mellitus with complication, without long-term current use of insulin ([place]) 06/22/2016  . Hypothyroidism 06/22/2016  . Hyperlipidemia 06/22/2016  . GERD (gastroesophageal reflux disease) 06/22/2016  . Status post cervical spinal fusion 05/23/2016  . Cervical spinal stenosis 04/05/2016  . Pain in right wrist 03/07/2016  . Neck pain 12/01/2015  . Other spondylosis with radiculopathy, cervical region 12/01/2015  . Diverticulitis of colon with perforation 10/20/2010   Past Medical History:    Diagnosis Date  . Arthritis   . Diabetes mellitus without complication ([place])   . Diverticulitis of colon   . GERD (gastroesophageal reflux disease)   . Headache   . Hypertension   . Hypothyroidism   . Thyroid disease     Family History  Problem Relation Age of Onset  . Hypertension Mother   . Diabetes Mother   . Heart attack Father   . Heart disease Father   . Diabetes Paternal Grandmother   . Cancer Paternal Grandfather        of eye    Past Surgical History:  Procedure Laterality Date  . ANKLE FRACTURE SURGERY    . ANTERIOR CERVICAL DECOMP/DISCECTOMY FUSION N/A 04/05/2016   Procedure: C5-6, C6-7 Anterior Cervical Discectomy and Fusion, Allograft, Plate;  Surgeon: Marybelle Killings, MD;  Location: [facility];  Service: Orthopedics;  Laterality: N/A;  . ANTERIOR FUSION CERVICAL SPINE  04/05/2016   c6 c7  . CARPAL TUNNEL RELEASE Bilateral   . 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 JOINT REPLACEMENT  11/10   right knee replaced   . KNEE SURGERY    . LEFT HEART CATH AND CORONARY ANGIOGRAPHY N/A 02/06/2017   Procedure: LEFT HEART CATH AND CORONARY ANGIOGRAPHY;  Surgeon: Belva Crome, MD;  Location: [facility];  Service: Cardiovascular;  Laterality: N/A;  . LEG SURGERY  11/03   broken lower right leg crushed    Social History   Occupational History  . Not on file  Tobacco Use  . Smoking status: Former Smoker    Types: Cigarettes    Last attempt to quit: 01/10/2008    Years since quitting: 9.2  . Smokeless tobacco: Former Systems developer    Types: Chew  Substance and Sexual Activity  . Alcohol use: Yes    Comment: occasionally  . Drug use: No  . Sexual activity: Not on file

## 2017-04-05 LAB — CBC
HCT: 40.5 % (ref 38.5–50.0)
HEMOGLOBIN: 13.8 g/dL (ref 13.2–17.1)
MCH: 29.6 pg (ref 27.0–33.0)
MCHC: 34.1 g/dL (ref 32.0–36.0)
MCV: 86.7 fL (ref 80.0–100.0)
MPV: 9.8 fL (ref 7.5–12.5)
PLATELETS: 307 10*3/uL (ref 140–400)
RBC: 4.67 10*6/uL (ref 4.20–5.80)
RDW: 12.9 % (ref 11.0–15.0)
WBC: 6.1 10*3/uL (ref 3.8–10.8)

## 2017-04-05 LAB — URIC ACID: Uric Acid, Serum: 7.5 mg/dL (ref 4.0–8.0)

## 2017-04-05 LAB — RHEUMATOID FACTOR

## 2017-04-05 LAB — SEDIMENTATION RATE: Sed Rate: 6 mm/h (ref 0–20)

## 2017-04-05 LAB — ANA: ANA: NEGATIVE

## 2017-04-12 ENCOUNTER — Ambulatory Visit (INDEPENDENT_AMBULATORY_CARE_PROVIDER_SITE_OTHER): Payer: 59 | Admitting: Orthopedic Surgery

## 2017-04-12 ENCOUNTER — Encounter (INDEPENDENT_AMBULATORY_CARE_PROVIDER_SITE_OTHER): Payer: Self-pay | Admitting: Orthopedic Surgery

## 2017-04-12 VITALS — Ht 69.0 in | Wt 200.0 lb

## 2017-04-12 DIAGNOSIS — M2022 Hallux rigidus, left foot: Secondary | ICD-10-CM

## 2017-04-12 NOTE — Progress Notes (Signed)
Office Visit Note   Patient: [name]           Date of Birth: 07-30-1950           MRN: [id number] Visit Date: 04/12/2017              Requested by: Binnie Rail, MD [place], [place] [address] PCP: Binnie Rail, MD  Chief Complaint  Patient presents with  . Right Foot - Pain  . Left Foot - Pain      HPI: Patient is a 67 year old gentleman with severe hallux rigidus bilateral great toes.  Patient has tried protective shoe wear without relief.  Patient states he would like to consider surgical intervention.  Blood work has been negative for gout.  Assessment & Plan: Visit Diagnoses:  1. Hallux rigidus, left foot     Plan: Patient would like to proceed with fusion of the left great toe MTP joint.  Risk and benefits were discussed patient states he understands wished to proceed at this time we will set this up as outpatient surgery at the surgical center follow-up in the office in 1 week to change the dressing and anticipate beginning weightbearing 4 weeks postoperatively.  Follow-Up Instructions: Return if symptoms worsen or fail to improve.   Ortho Exam  Patient is alert, oriented, no adenopathy, well-dressed, normal affect, normal respiratory effort. Examination patient has an antalgic gait he has good pulses bilaterally.  He has essentially no range of motion of the great toe MTP joint bilaterally there is no redness no cellulitis no tophaceous deposits no signs of gout.  There are large osteophytic bone spurs around the MTP joint radiographs shows complete destruction of the MTP joint with large bony spurs medially.  Imaging: No results found. No images are attached to the encounter.  Labs: Lab Results  Component Value Date   HGBA1C 6.8 (H) 12/27/2016   HGBA1C 6.6 (H) 06/22/2016   HGBA1C 6.2 (H) 03/24/2016   ESRSEDRATE 6 04/03/2017   LABURIC 7.5 04/03/2017   REPTSTATUS 09/16/2010 FINAL 09/15/2010   CULT NO GROWTH 09/15/2010     @LABSALLVALUES (HGBA1)@  Body mass index is 29.53 kg/m.  Orders:  No orders of the defined types were placed in this encounter.  No orders of the defined types were placed in this encounter.    Procedures: No procedures performed  Clinical Data: No additional findings.  ROS:  All other systems negative, except as noted in the HPI. Review of Systems  Objective: Vital Signs: Ht 5\' 9"  (1.753 m)   Wt 200 lb (90.7 kg)   BMI 29.53 kg/m   Specialty Comments:  No specialty comments available.  PMFS History: Patient Active Problem List   Diagnosis Date Noted  . Abnormal nuclear stress test 01/23/2017  . Chest discomfort 01/23/2017  . Intermittent chest pain 12/27/2016  . Type 2 diabetes mellitus with complication, without long-term current use of insulin ([place]) 06/22/2016  . Hypothyroidism 06/22/2016  . Hyperlipidemia 06/22/2016  . GERD (gastroesophageal reflux disease) 06/22/2016  . Status post cervical spinal fusion 05/23/2016  . Cervical spinal stenosis 04/05/2016  . Pain in right wrist 03/07/2016  . Neck pain 12/01/2015  . Other spondylosis with radiculopathy, cervical region 12/01/2015  . Diverticulitis of colon with perforation 10/20/2010   Past Medical History:  Diagnosis Date  . Arthritis   . Diabetes mellitus without complication ([place])   . Diverticulitis of colon   . GERD (gastroesophageal reflux disease)   . Headache   .  Hypertension   . Hypothyroidism   . Thyroid disease     Family History  Problem Relation Age of Onset  . Hypertension Mother   . Diabetes Mother   . Heart attack Father   . Heart disease Father   . Diabetes Paternal Grandmother   . Cancer Paternal Grandfather        of eye    Past Surgical History:  Procedure Laterality Date  . ANKLE FRACTURE SURGERY    . ANTERIOR CERVICAL DECOMP/DISCECTOMY FUSION N/A 04/05/2016   Procedure: C5-6, C6-7 Anterior Cervical Discectomy and Fusion, Allograft, Plate;  Surgeon: Marybelle Killings, MD;   Location: [facility];  Service: Orthopedics;  Laterality: N/A;  . ANTERIOR FUSION CERVICAL SPINE  04/05/2016   c6 c7  . CARPAL TUNNEL RELEASE Bilateral   . 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 JOINT REPLACEMENT  11/10   right knee replaced   . KNEE SURGERY    . LEFT HEART CATH AND CORONARY ANGIOGRAPHY N/A 02/06/2017   Procedure: LEFT HEART CATH AND CORONARY ANGIOGRAPHY;  Surgeon: Belva Crome, MD;  Location: [facility];  Service: Cardiovascular;  Laterality: N/A;  . LEG SURGERY  11/03   broken lower right leg crushed    Social History   Occupational History  . Not on file  Tobacco Use  . Smoking status: Former Smoker    Types: Cigarettes    Last attempt to quit: 01/10/2008    Years since quitting: 9.2  . Smokeless tobacco: Former Systems developer    Types: Chew  Substance and Sexual Activity  . Alcohol use: Yes    Comment: occasionally  . Drug use: No  . Sexual activity: Not on file

## 2017-05-15 DIAGNOSIS — M2022 Hallux rigidus, left foot: Secondary | ICD-10-CM | POA: Diagnosis not present

## 2017-05-22 ENCOUNTER — Institutional Professional Consult (permissible substitution): Payer: 59 | Admitting: Pulmonary Disease

## 2017-05-23 ENCOUNTER — Ambulatory Visit (INDEPENDENT_AMBULATORY_CARE_PROVIDER_SITE_OTHER): Payer: 59

## 2017-05-23 ENCOUNTER — Encounter (INDEPENDENT_AMBULATORY_CARE_PROVIDER_SITE_OTHER): Payer: Self-pay

## 2017-05-23 VITALS — Ht 69.0 in | Wt 200.0 lb

## 2017-05-23 DIAGNOSIS — M2022 Hallux rigidus, left foot: Secondary | ICD-10-CM

## 2017-05-23 NOTE — Progress Notes (Signed)
Patient is s/p a left GT MTP fusion on 05/15/17. He is non weight bearing using a knee scooter for ambulation and has been elevating his foot higher than his heart. The incision is well approximated with no redness or swelling. The pt does not report having any pain and states that he has not had to use pain medication given after surgery. Advised to wash the area with dial soap and water and apply a dry dressing daily. His is to remain not weight bearing continue with elevation as he has been doing and to call the office with any changes. Patient will follow up with Dr. Sharol Given in one week.    Melba Araki, [place], [employer]

## 2017-05-30 ENCOUNTER — Ambulatory Visit (INDEPENDENT_AMBULATORY_CARE_PROVIDER_SITE_OTHER): Payer: 59 | Admitting: Family

## 2017-05-30 ENCOUNTER — Encounter (INDEPENDENT_AMBULATORY_CARE_PROVIDER_SITE_OTHER): Payer: Self-pay | Admitting: Family

## 2017-05-30 ENCOUNTER — Ambulatory Visit (INDEPENDENT_AMBULATORY_CARE_PROVIDER_SITE_OTHER): Payer: 59

## 2017-05-30 VITALS — Ht 69.0 in | Wt 200.0 lb

## 2017-05-30 DIAGNOSIS — M2022 Hallux rigidus, left foot: Secondary | ICD-10-CM | POA: Diagnosis not present

## 2017-05-31 ENCOUNTER — Encounter (INDEPENDENT_AMBULATORY_CARE_PROVIDER_SITE_OTHER): Payer: Self-pay | Admitting: Family

## 2017-05-31 NOTE — Progress Notes (Signed)
Post-Op Visit Note   Patient: [name]           Date of Birth: 05-01-1950           MRN: [id number] Visit Date: 05/30/2017 PCP: Binnie Rail, MD  Chief Complaint:  Chief Complaint  Patient presents with  . Left Foot - Routine Post Op    05/15/17 GT MTP fusion     HPI:  HPI  Patient is a 67 year old gentleman seen today status post left great toe fusion on 05/15/17. Has been weight bearing with crutches.   Ortho Exam Incision is healing well. No gaping, drainage or erythema. Minimal swelling.   Visit Diagnoses:  1. Hallux rigidus, left foot     Plan: sutures harvested. Begin daily dial soap cleansing. May advance weight bearing in post op shoe. Dry dressings daily.   Follow-Up Instructions: Return in about 2 weeks (around 06/13/2017).   Imaging: No results found.  Orders:  Orders Placed This Encounter  Procedures  . XR Toe Great Left   No orders of the defined types were placed in this encounter.    PMFS History: Patient Active Problem List   Diagnosis Date Noted  . Abnormal nuclear stress test 01/23/2017  . Chest discomfort 01/23/2017  . Intermittent chest pain 12/27/2016  . Type 2 diabetes mellitus with complication, without long-term current use of insulin ([place]) 06/22/2016  . Hypothyroidism 06/22/2016  . Hyperlipidemia 06/22/2016  . GERD (gastroesophageal reflux disease) 06/22/2016  . Status post cervical spinal fusion 05/23/2016  . Cervical spinal stenosis 04/05/2016  . Pain in right wrist 03/07/2016  . Neck pain 12/01/2015  . Other spondylosis with radiculopathy, cervical region 12/01/2015  . Diverticulitis of colon with perforation 10/20/2010   Past Medical History:  Diagnosis Date  . Arthritis   . Diabetes mellitus without complication ([place])   . Diverticulitis of colon   . GERD (gastroesophageal reflux disease)   . Headache   . Hypertension   . Hypothyroidism   . Thyroid disease     Family History  Problem Relation Age of Onset    . Hypertension Mother   . Diabetes Mother   . Heart attack Father   . Heart disease Father   . Diabetes Paternal Grandmother   . Cancer Paternal Grandfather        of eye    Past Surgical History:  Procedure Laterality Date  . ANKLE FRACTURE SURGERY    . ANTERIOR CERVICAL DECOMP/DISCECTOMY FUSION N/A 04/05/2016   Procedure: C5-6, C6-7 Anterior Cervical Discectomy and Fusion, Allograft, Plate;  Surgeon: Marybelle Killings, MD;  Location: [facility];  Service: Orthopedics;  Laterality: N/A;  . ANTERIOR FUSION CERVICAL SPINE  04/05/2016   c6 c7  . CARPAL TUNNEL RELEASE Bilateral   . 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 JOINT REPLACEMENT  11/10   right knee replaced   . KNEE SURGERY    . LEFT HEART CATH AND CORONARY ANGIOGRAPHY N/A 02/06/2017   Procedure: LEFT HEART CATH AND CORONARY ANGIOGRAPHY;  Surgeon: Belva Crome, MD;  Location: [facility];  Service: Cardiovascular;  Laterality: N/A;  . LEG SURGERY  11/03   broken lower right leg crushed    Social History   Occupational History  . Not on file  Tobacco Use  . Smoking status: Former Smoker    Types: Cigarettes    Last attempt to quit: 01/10/2008    Years since quitting: 9.3  .  Smokeless tobacco: Former Systems developer    Types: Chew  Substance and Sexual Activity  . Alcohol use: Yes    Comment: occasionally  . Drug use: No  . Sexual activity: Not on file

## 2017-06-12 ENCOUNTER — Encounter (INDEPENDENT_AMBULATORY_CARE_PROVIDER_SITE_OTHER): Payer: Self-pay | Admitting: Orthopedic Surgery

## 2017-06-12 ENCOUNTER — Ambulatory Visit (INDEPENDENT_AMBULATORY_CARE_PROVIDER_SITE_OTHER): Payer: 59 | Admitting: Orthopedic Surgery

## 2017-06-12 DIAGNOSIS — M2022 Hallux rigidus, left foot: Secondary | ICD-10-CM

## 2017-06-12 NOTE — Progress Notes (Signed)
Office Visit Note   Patient: [name]           Date of Birth: [birth year]           MRN: [id number] Visit Date: 06/12/2017              Requested by: Binnie Rail, MD [place], [place] [id number] PCP: Binnie Rail, MD  Chief Complaint  Patient presents with  . Left Foot - Follow-up      HPI: Patient is a 67 year old gentleman who presents 4 weeks status post fusion left foot MTP joint for hallux rigidus.  Patient states he occasionally takes ibuprofen.  Assessment & Plan: Visit Diagnoses:  1. Hallux rigidus, left foot     Plan: Continue with the postoperative shoe with a cane in the right hand follow-up in 4 weeks with three-view radiographs of the left foot.  Discussed that at that time we could put him in a stiff soled Trail running shoe and as he feels comfortable he can advance to a steel toe shoes for work.  Patient states he would like to have surgery on the right foot as well.  Follow-Up Instructions: Return in about 1 month (around 07/10/2017).   Ortho Exam  Patient is alert, oriented, no adenopathy, well-dressed, normal affect, normal respiratory effort. Examination the incision is healing well there is no redness no cellulitis no signs of infection.  He has a very superficial eschar over the mid aspect of the dorsal incision.  There is no cellulitis no odor no drainage no tenderness to palpation no signs of infection.  Imaging: No results found. No images are attached to the encounter.  Labs: Lab Results  Component Value Date   HGBA1C 6.8 (H) 12/27/2016   HGBA1C 6.6 (H) 06/22/2016   HGBA1C 6.2 (H) 03/24/2016   ESRSEDRATE 6 04/03/2017   LABURIC 7.5 04/03/2017   REPTSTATUS 09/16/2010 FINAL 09/15/2010   CULT NO GROWTH 09/15/2010     Lab Results  Component Value Date   ALBUMIN 4.2 12/27/2016   ALBUMIN 4.3 06/22/2016   ALBUMIN 3.7 03/24/2016   LABURIC 7.5 04/03/2017    There is no height or weight on file to calculate  BMI.  Orders:  No orders of the defined types were placed in this encounter.  No orders of the defined types were placed in this encounter.    Procedures: No procedures performed  Clinical Data: No additional findings.  ROS:  All other systems negative, except as noted in the HPI. Review of Systems  Objective: Vital Signs: There were no vitals taken for this visit.  Specialty Comments:  No specialty comments available.  PMFS History: Patient Active Problem List   Diagnosis Date Noted  . Abnormal nuclear stress test 01/23/2017  . Chest discomfort 01/23/2017  . Intermittent chest pain 12/27/2016  . Type 2 diabetes mellitus with complication, without long-term current use of insulin ([place]) 06/22/2016  . Hypothyroidism 06/22/2016  . Hyperlipidemia 06/22/2016  . GERD (gastroesophageal reflux disease) 06/22/2016  . Status post cervical spinal fusion 05/23/2016  . Cervical spinal stenosis 04/05/2016  . Pain in right wrist 03/07/2016  . Neck pain 12/01/2015  . Other spondylosis with radiculopathy, cervical region 12/01/2015  . Diverticulitis of colon with perforation 10/20/2010   Past Medical History:  Diagnosis Date  . Arthritis   . Diabetes mellitus without complication ([place])   . Diverticulitis of colon   . GERD (gastroesophageal reflux disease)   . Headache   .  Hypertension   . Hypothyroidism   . Thyroid disease     Family History  Problem Relation Age of Onset  . Hypertension Mother   . Diabetes Mother   . Heart attack Father   . Heart disease Father   . Diabetes Paternal Grandmother   . Cancer Paternal Grandfather        of eye    Past Surgical History:  Procedure Laterality Date  . ANKLE FRACTURE SURGERY    . ANTERIOR CERVICAL DECOMP/DISCECTOMY FUSION N/A 04/05/2016   Procedure: C5-6, C6-7 Anterior Cervical Discectomy and Fusion, Allograft, Plate;  Surgeon: Marybelle Killings, MD;  Location: [facility];  Service: Orthopedics;  Laterality: N/A;  . ANTERIOR FUSION  CERVICAL SPINE  04/05/2016   c6 c7  . CARPAL TUNNEL RELEASE Bilateral   . 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 JOINT REPLACEMENT  11/10   right knee replaced   . KNEE SURGERY    . LEFT HEART CATH AND CORONARY ANGIOGRAPHY N/A 02/06/2017   Procedure: LEFT HEART CATH AND CORONARY ANGIOGRAPHY;  Surgeon: Belva Crome, MD;  Location: [facility];  Service: Cardiovascular;  Laterality: N/A;  . LEG SURGERY  11/03   broken lower right leg crushed    Social History   Occupational History  . Not on file  Tobacco Use  . Smoking status: Former Smoker    Types: Cigarettes    Last attempt to quit: 01/10/2008    Years since quitting: 9.4  . Smokeless tobacco: Former Systems developer    Types: Chew  Substance and Sexual Activity  . Alcohol use: Yes    Comment: occasionally  . Drug use: No  . Sexual activity: Not on file

## 2017-06-13 ENCOUNTER — Ambulatory Visit (INDEPENDENT_AMBULATORY_CARE_PROVIDER_SITE_OTHER): Payer: 59 | Admitting: Orthopedic Surgery

## 2017-06-26 ENCOUNTER — Encounter: Payer: Self-pay | Admitting: Pulmonary Disease

## 2017-06-26 ENCOUNTER — Ambulatory Visit (INDEPENDENT_AMBULATORY_CARE_PROVIDER_SITE_OTHER): Payer: 59 | Admitting: Pulmonary Disease

## 2017-06-26 DIAGNOSIS — F17201 Nicotine dependence, unspecified, in remission: Secondary | ICD-10-CM | POA: Insufficient documentation

## 2017-06-26 DIAGNOSIS — I10 Essential (primary) hypertension: Secondary | ICD-10-CM | POA: Insufficient documentation

## 2017-06-26 DIAGNOSIS — G4733 Obstructive sleep apnea (adult) (pediatric): Secondary | ICD-10-CM | POA: Diagnosis not present

## 2017-06-26 DIAGNOSIS — E1159 Type 2 diabetes mellitus with other circulatory complications: Secondary | ICD-10-CM | POA: Insufficient documentation

## 2017-06-26 NOTE — Progress Notes (Signed)
Subjective:    Patient ID: [name], male    DOB: [birth year], 67 y.o.   MRN: [id number]  HPI The patient is here for follow up.  Diabetes: He is taking his medication daily as prescribed. He has been a little less compliant with a diabetic diet. He is not exercising regularly due to recent foot surgery, but will restart walking when he is able.  He checks his feet daily and denies foot lesions. He is up-to-date with an ophthalmology examination.   Hypothyroidism:  He is taking his medication daily.  He denies any recent changes in energy or weight that are unexplained.   Hypertension: He is taking his medication daily. He is compliant with a low sodium diet.  He denies chest pain, palpitations, edema  and regular headaches. He is not exercising regularly.      GERD:  He is taking his medication daily as prescribed.  He denies any GERD symptoms and feels his GERD is well controlled.    Medications and allergies reviewed with patient and updated if appropriate.  Patient Active Problem List   Diagnosis Date Noted  . Hypertension 06/26/2017  . OSA (obstructive sleep apnea) 06/26/2017  . Tobacco abuse, in remission 06/26/2017  . Abnormal nuclear stress test 01/23/2017  . Chest discomfort 01/23/2017  . Intermittent chest pain 12/27/2016  . Type 2 diabetes mellitus with complication, without long-term current use of insulin ([place]) 06/22/2016  . Hypothyroidism 06/22/2016  . GERD (gastroesophageal reflux disease) 06/22/2016  . Status post cervical spinal fusion 05/23/2016  . Cervical spinal stenosis 04/05/2016  . Pain in right wrist 03/07/2016  . Neck pain 12/01/2015  . Other spondylosis with radiculopathy, cervical region 12/01/2015  . Diverticulitis of colon with perforation 10/20/2010    Current Outpatient Medications on File Prior to Visit  Medication Sig Dispense Refill  . Ascorbic Acid (VITAMIN C WITH ROSE HIPS) 500 MG tablet Take 1,000 mg by mouth daily.    Marland Kitchen aspirin  EC 81 MG tablet Take 81 mg by mouth daily.    . B Complex-C (SUPER B COMPLEX PO) Take 1 tablet by mouth daily.    . Calcium Carbonate-Vitamin D (CALCIUM-VITAMIN D3 PO) Take 1 tablet by mouth every other day.    . Cholecalciferol (VITAMIN D3) 1000 units CAPS Take 1,000 Units by mouth daily.    . Garlic 1245 MG CAPS Take 1,000 mg by mouth every other day.    . levothyroxine (SYNTHROID, LEVOTHROID) 100 MCG tablet Take 1 tablet (100 mcg total) by mouth daily before breakfast. 90 tablet 3  . losartan-hydrochlorothiazide (HYZAAR) 100-12.5 MG tablet Take 1 tablet by mouth daily after breakfast. 90 tablet 3  . Magnesium 100 MG TABS Take 100 mg by mouth every other day.    . metFORMIN (GLUCOPHAGE-XR) 500 MG 24 hr tablet Take 2 tablets (1,000 mg total) by mouth at bedtime. 180 tablet 3  . methocarbamol (ROBAXIN) 500 MG tablet Take 1 tablet (500 mg total) by mouth every 8 (eight) hours as needed for muscle spasms. 40 tablet 0  . Multiple Vitamins-Minerals (MULTIVITAMIN ADULTS 50+ PO) Take 1 tablet by mouth daily.    . Omega-3 Fatty Acids (OMEGA 3 PO) Take 3 capsules by mouth daily.    . pantoprazole (PROTONIX) 20 MG tablet Take 1 tablet (20 mg total) by mouth daily after breakfast. 90 tablet 3  . vitamin E 400 UNIT capsule Take 400 Units by mouth every other day.     No current facility-administered medications  on file prior to visit.     Past Medical History:  Diagnosis Date  . Arthritis   . Diabetes mellitus without complication ([place])   . Diverticulitis of colon   . GERD (gastroesophageal reflux disease)   . Headache   . Hypertension   . Hypothyroidism   . Thyroid disease     Past Surgical History:  Procedure Laterality Date  . ANKLE FRACTURE SURGERY    . ANTERIOR CERVICAL DECOMP/DISCECTOMY FUSION N/A 04/05/2016   Procedure: C5-6, C6-7 Anterior Cervical Discectomy and Fusion, Allograft, Plate;  Surgeon: Marybelle Killings, MD;  Location: [facility];  Service: Orthopedics;  Laterality: N/A;  . ANTERIOR  FUSION CERVICAL SPINE  04/05/2016   c6 c7  . CARPAL TUNNEL RELEASE Bilateral   . 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 JOINT REPLACEMENT  11/10   right knee replaced   . KNEE SURGERY    . LEFT HEART CATH AND CORONARY ANGIOGRAPHY N/A 02/06/2017   Procedure: LEFT HEART CATH AND CORONARY ANGIOGRAPHY;  Surgeon: Belva Crome, MD;  Location: [facility];  Service: Cardiovascular;  Laterality: N/A;  . LEG SURGERY  11/03   broken lower right leg crushed     Social History   Socioeconomic History  . Marital status: Divorced    Spouse name: Not on file  . Number of children: Not on file  . Years of education: Not on file  . Highest education level: Not on file  Occupational History  . Not on file  Social Needs  . Financial resource strain: Not on file  . Food insecurity:    Worry: Not on file    Inability: Not on file  . Transportation needs:    Medical: Not on file    Non-medical: Not on file  Tobacco Use  . Smoking status: Former Smoker    Types: Cigarettes    Last attempt to quit: 01/10/2008    Years since quitting: 9.4  . Smokeless tobacco: Former Systems developer    Types: Chew  Substance and Sexual Activity  . Alcohol use: Yes    Comment: occasionally  . Drug use: No  . Sexual activity: Not on file  Lifestyle  . Physical activity:    Days per week: Not on file    Minutes per session: Not on file  . Stress: Not on file  Relationships  . Social connections:    Talks on phone: Not on file    Gets together: Not on file    Attends religious service: Not on file    Active member of club or organization: Not on file    Attends meetings of clubs or organizations: Not on file    Relationship status: Not on file  Other Topics Concern  . Not on file  Social History Narrative  . Not on file    Family History  Problem Relation Age of Onset  . Hypertension Mother   . Diabetes Mother   . Heart attack Father   . Heart disease Father   . Diabetes Paternal  Grandmother   . Cancer Paternal Grandfather        of eye    Review of Systems  Constitutional: Negative for chills, fatigue and fever.  Respiratory: Positive for cough (mild when he first gets up) and shortness of breath (mild with humidity with exertion). Negative for wheezing.   Cardiovascular: Negative for chest pain, palpitations and leg swelling.  Gastrointestinal:  Gerd controlled  Neurological: Positive for headaches (occ). Negative for light-headedness and numbness.       Objective:   Vitals:   06/27/17 0735  BP: 138/84  Pulse: 60  Temp: 97.7 F (36.5 C)  SpO2: 96%   BP Readings from Last 3 Encounters:  06/27/17 138/84  06/26/17 118/74  04/03/17 118/64   Wt Readings from Last 3 Encounters:  06/27/17 195 lb (88.5 kg)  06/26/17 193 lb (87.5 kg)  05/30/17 200 lb (90.7 kg)   Body mass index is 28.8 kg/m.   Physical Exam    Constitutional: Appears well-developed and well-nourished. No distress.  HENT:  Head: Normocephalic and atraumatic.  Neck: Neck supple. No tracheal deviation present. No thyromegaly present.  No cervical lymphadenopathy Cardiovascular: Normal rate, regular rhythm and normal heart sounds.   No murmur heard. No carotid bruit .  No edema Pulmonary/Chest: Effort normal and breath sounds normal. No respiratory distress. No has no wheezes. No rales.  Skin: Skin is warm and dry. Not diaphoretic.  Psychiatric: Normal mood and affect. Behavior is normal.      Assessment & Plan:    See Problem List for Assessment and Plan of chronic medical problems.

## 2017-06-26 NOTE — Patient Instructions (Signed)
Schedule home sleep study.  Refer to lung cancer screening program

## 2017-06-26 NOTE — Patient Instructions (Addendum)
  Test(s) ordered today. Your results will be released to MyChart (or called to you) after review, usually within 72hours after test completion. If any changes need to be made, you will be notified at that same time.  Medications reviewed and updated.  No changes recommended at this time.    Please followup in 6 months   

## 2017-06-26 NOTE — Progress Notes (Signed)
   Subjective:    Patient ID: [name], male    DOB: 1950-09-05, 67 y.o.   MRN: [id number]  HPI    Review of Systems  Constitutional: Negative for fever and unexpected weight change.  HENT: Negative for congestion, dental problem, ear pain, nosebleeds, postnasal drip, rhinorrhea, sinus pressure, sneezing, sore throat and trouble swallowing.   Eyes: Negative for redness and itching.  Respiratory: Negative for cough, chest tightness, shortness of breath and wheezing.   Cardiovascular: Negative for palpitations and leg swelling.  Gastrointestinal: Negative for nausea and vomiting.  Genitourinary: Negative for dysuria.  Musculoskeletal: Negative for joint swelling.  Skin: Negative for rash.  Allergic/Immunologic: Negative.  Negative for environmental allergies, food allergies and immunocompromised state.  Neurological: Negative for headaches.  Hematological: Does not bruise/bleed easily.  Psychiatric/Behavioral: Negative for dysphoric mood. The patient is not nervous/anxious.        Objective:   Physical Exam        Assessment & Plan:

## 2017-06-26 NOTE — Assessment & Plan Note (Signed)
Given excessive daytime somnolence, narrow pharyngeal exam & loud snoring, obstructive sleep apnea is very likely & an overnight polysomnogram will be scheduled as a home study. The pathophysiology of obstructive sleep apnea , it's cardiovascular consequences & modes of treatment including CPAP were discused with the patient in detail & they evidenced understanding.  Pretest probability is high

## 2017-06-26 NOTE — Progress Notes (Signed)
Subjective:    Patient ID: [name], male    DOB: May 31, 1950, 67 y.o.   MRN: [id number]  HPI 67 year old diabetic, hypertensive presents for evaluation of sleep disordered breathing. He developed chest discomfort and underwent stress echo which is suggestive of ischemia.  Left heart catheterization did not show any evidence of coronary artery disease  He reports non-refreshing sleep.  He wakes up feeling tired and feels tired throughout the days.  He reports frequent naps.  He takes a power nap for about 30 minutes and is very cold during his workday and such a nap is refreshing.  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  Epworth sleepiness score is 12 and he reports sleepiness while watching TV, sitting inactive in a public place, or as a passenger in a car or lying down to rest in the afternoon. Bedtime is between 830 and 10:30 PM, sleep latency is minimal, he starts off sleeping on his back but rolls over on his side, with 2 pillows, reports 2-3 nocturnal awakenings for nocturia and is out of bed by 4:30 AM feeling tired with dryness of mouth and occasional morning headaches last about 2 hours.  Loud snoring is been noted by his daughter and mother who lives in the same house There is no history suggestive of cataplexy, sleep paralysis or parasomnias   He is now on short-term disability due to injury to his left foot  He smoked about 2 packs/day for 40 years before he quit in 2011. Chest x-ray 03/2016 shows hyperinflation.  Denies dyspnea or wheezing or frequent chest colds  Past Medical History:  Diagnosis Date  . Arthritis   . Diabetes mellitus without complication ([place])   . Diverticulitis of colon   . GERD (gastroesophageal reflux disease)   . Headache   . Hypertension   . Hypothyroidism   . Thyroid disease      Past Surgical History:  Procedure Laterality Date   . ANKLE FRACTURE SURGERY    . ANTERIOR CERVICAL DECOMP/DISCECTOMY FUSION N/A 04/05/2016   Procedure: C5-6, C6-7 Anterior Cervical Discectomy and Fusion, Allograft, Plate;  Surgeon: Marybelle Killings, MD;  Location: [facility];  Service: Orthopedics;  Laterality: N/A;  . ANTERIOR FUSION CERVICAL SPINE  04/05/2016   c6 c7  . CARPAL TUNNEL RELEASE Bilateral   . 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 JOINT REPLACEMENT  11/10   right knee replaced   . KNEE SURGERY    . LEFT HEART CATH AND CORONARY ANGIOGRAPHY N/A 02/06/2017   Procedure: LEFT HEART CATH AND CORONARY ANGIOGRAPHY;  Surgeon: Belva Crome, MD;  Location: [facility];  Service: Cardiovascular;  Laterality: N/A;  . LEG SURGERY  11/03   broken lower right leg crushed     Allergies  Allergen Reactions  . Morphine Other (See Comments)    Severe headache     Social History   Socioeconomic History  . Marital status: Divorced    Spouse name: Not on file  . Number of children: Not on file  . Years of education: Not on file  . Highest education level: Not on file  Occupational History  . Not on file  Social Needs  . Financial resource strain: Not on file  . Food insecurity:    Worry: Not on file    Inability: Not  on file  . Transportation needs:    Medical: Not on file    Non-medical: Not on file  Tobacco Use  . Smoking status: Former Smoker    Types: Cigarettes    Last attempt to quit: 01/10/2008    Years since quitting: 9.4  . Smokeless tobacco: Former Systems developer    Types: Chew  Substance and Sexual Activity  . Alcohol use: Yes    Comment: occasionally  . Drug use: No  . Sexual activity: Not on file  Lifestyle  . Physical activity:    Days per week: Not on file    Minutes per session: Not on file  . Stress: Not on file  Relationships  . Social connections:    Talks on phone: Not on file    Gets together: Not on file    Attends religious service: Not on file    Active member of club or organization: Not  on file    Attends meetings of clubs or organizations: Not on file    Relationship status: Not on file  . Intimate partner violence:    Fear of current or ex partner: Not on file    Emotionally abused: Not on file    Physically abused: Not on file    Forced sexual activity: Not on file  Other Topics Concern  . Not on file  Social History Narrative  . Not on file      Family History  Problem Relation Age of Onset  . Hypertension Mother   . Diabetes Mother   . Heart attack Father   . Heart disease Father   . Diabetes Paternal Grandmother   . Cancer Paternal Grandfather        of eye    Review of Systems Review of Systems  Constitutional: Negative for fever and unexpected weight change.  HENT: Negative for congestion, dental problem, ear pain, nosebleeds, postnasal drip, rhinorrhea, sinus pressure, sneezing, sore throat and trouble swallowing.   Eyes: Negative for redness and itching.  Respiratory: Negative for cough, chest tightness, shortness of breath and wheezing.   Cardiovascular: Negative for palpitations and leg swelling.  Gastrointestinal: Negative for nausea and vomiting.  Genitourinary: Negative for dysuria.  Musculoskeletal: Negative for joint swelling.  Skin: Negative for rash.  Allergic/Immunologic: Negative.  Negative for environmental allergies, food allergies and immunocompromised state.  Neurological: Negative for headaches.  Hematological: Does not bruise/bleed easily.  Psychiatric/Behavioral: Negative for dysphoric mood. The patient is not nervous/anxious    Objective:   Physical Exam  Gen. Pleasant,  in no distress ENT - no lesions, no post nasal drip Neck: No JVD, no thyromegaly, no carotid bruits Lungs: barrel chest no use of accessory muscles, no dullness to percussion, decreased without rales or rhonchi  Cardiovascular: Rhythm regular, heart sounds  normal, no murmurs or gallops, no peripheral edema Musculoskeletal: No deformities, no cyanosis  or clubbing , no tremors       Assessment & Plan:

## 2017-06-26 NOTE — Assessment & Plan Note (Signed)
Due to his more than 40-year history of smoking, he is at risk for lung malignancy and would qualify for lung cancer screening program low-dose CT scan

## 2017-06-27 ENCOUNTER — Encounter: Payer: Self-pay | Admitting: Internal Medicine

## 2017-06-27 ENCOUNTER — Other Ambulatory Visit (INDEPENDENT_AMBULATORY_CARE_PROVIDER_SITE_OTHER): Payer: 59

## 2017-06-27 ENCOUNTER — Ambulatory Visit (INDEPENDENT_AMBULATORY_CARE_PROVIDER_SITE_OTHER): Payer: 59 | Admitting: Internal Medicine

## 2017-06-27 VITALS — BP 138/84 | HR 60 | Temp 97.7°F | Ht 69.0 in | Wt 195.0 lb

## 2017-06-27 DIAGNOSIS — E038 Other specified hypothyroidism: Secondary | ICD-10-CM

## 2017-06-27 DIAGNOSIS — Z23 Encounter for immunization: Secondary | ICD-10-CM | POA: Diagnosis not present

## 2017-06-27 DIAGNOSIS — E118 Type 2 diabetes mellitus with unspecified complications: Secondary | ICD-10-CM | POA: Diagnosis not present

## 2017-06-27 DIAGNOSIS — K219 Gastro-esophageal reflux disease without esophagitis: Secondary | ICD-10-CM | POA: Diagnosis not present

## 2017-06-27 DIAGNOSIS — I10 Essential (primary) hypertension: Secondary | ICD-10-CM | POA: Diagnosis not present

## 2017-06-27 LAB — LIPID PANEL
Cholesterol: 171 mg/dL (ref 0–200)
HDL: 42 mg/dL (ref >39.00)
LDL CALC: 94 mg/dL (ref 0–99)
NonHDL: 129.17
Total CHOL/HDL Ratio: 4
Triglycerides: 174 mg/dL — ABNORMAL HIGH (ref 0.0–149.0)
VLDL: 34.8 mg/dL (ref 0.0–40.0)

## 2017-06-27 LAB — COMPREHENSIVE METABOLIC PANEL
ALBUMIN: 4.4 g/dL (ref 3.5–5.2)
ALT: 17 U/L (ref 0–53)
AST: 16 U/L (ref 0–37)
Alkaline Phosphatase: 64 U/L (ref 39–117)
BUN: 26 mg/dL — ABNORMAL HIGH (ref 6–23)
CHLORIDE: 104 meq/L (ref 96–112)
CO2: 28 mEq/L (ref 19–32)
Calcium: 9.8 mg/dL (ref 8.4–10.5)
Creatinine, Ser: 1.19 mg/dL (ref 0.40–1.50)
GFR: 65.29 mL/min (ref >60.00)
Glucose, Bld: 123 mg/dL — ABNORMAL HIGH (ref 70–99)
POTASSIUM: 4.1 meq/L (ref 3.5–5.1)
SODIUM: 140 meq/L (ref 135–145)
Total Bilirubin: 0.6 mg/dL (ref 0.2–1.2)
Total Protein: 7.5 g/dL (ref 6.0–8.3)

## 2017-06-27 LAB — HEMOGLOBIN A1C: HEMOGLOBIN A1C: 7 % — AB (ref 4.6–6.5)

## 2017-06-27 LAB — TSH: TSH: 2.1 u[IU]/mL (ref 0.35–4.50)

## 2017-06-27 NOTE — Assessment & Plan Note (Signed)
Check tsh  Titrate med dose if needed  

## 2017-06-27 NOTE — Assessment & Plan Note (Signed)
GERD controlled Continue daily medication  

## 2017-06-27 NOTE — Assessment & Plan Note (Signed)
BP well controlled Current regimen effective and well tolerated Continue current medications at current doses cmp  

## 2017-06-27 NOTE — Assessment & Plan Note (Signed)
Check a1c He will restart exercise when able Has not been as compliant with a diabetic diet since being off of work for his foot surgery but trying to do better

## 2017-06-28 ENCOUNTER — Other Ambulatory Visit: Payer: Self-pay | Admitting: Acute Care

## 2017-06-28 DIAGNOSIS — Z87891 Personal history of nicotine dependence: Secondary | ICD-10-CM

## 2017-06-28 DIAGNOSIS — Z122 Encounter for screening for malignant neoplasm of respiratory organs: Secondary | ICD-10-CM

## 2017-07-06 ENCOUNTER — Encounter: Payer: Self-pay | Admitting: Acute Care

## 2017-07-06 ENCOUNTER — Ambulatory Visit (INDEPENDENT_AMBULATORY_CARE_PROVIDER_SITE_OTHER): Payer: 59 | Admitting: Acute Care

## 2017-07-06 ENCOUNTER — Ambulatory Visit (INDEPENDENT_AMBULATORY_CARE_PROVIDER_SITE_OTHER)
Admission: RE | Admit: 2017-07-06 | Discharge: 2017-07-06 | Disposition: A | Discharge status: Home / Self Care | Payer: 59 | Source: Ambulatory Visit | Attending: Acute Care | Admitting: Acute Care

## 2017-07-06 DIAGNOSIS — Z87891 Personal history of nicotine dependence: Secondary | ICD-10-CM | POA: Diagnosis not present

## 2017-07-06 DIAGNOSIS — Z122 Encounter for screening for malignant neoplasm of respiratory organs: Secondary | ICD-10-CM

## 2017-07-06 NOTE — Progress Notes (Signed)
Shared Decision Making Visit Lung Cancer Screening Program [phone])   Eligibility:  Age 67 y.o.  Pack Years Smoking History Calculation 86 pack year smoking history (# packs/per year x # years smoked)  Recent History of coughing up blood  no  Unexplained weight loss? no ( >Than 15 pounds within the last 6 months )  Prior History Lung / other cancer no (Diagnosis within the last 5 years already requiring surveillance chest CT Scans).  Smoking Status Former Smoker  Former Smokers: Years since quit: 8 years  Quit Date: 2011  Visit Components:  Discussion included one or more decision making aids. yes  Discussion included risk/benefits of screening. yes  Discussion included potential follow up diagnostic testing for abnormal scans. yes  Discussion included meaning and risk of over diagnosis. yes  Discussion included meaning and risk of False Positives. yes  Discussion included meaning of total radiation exposure. yes  Counseling Included:  Importance of adherence to annual lung cancer LDCT screening. yes  Impact of comorbidities on ability to participate in the program. yes  Ability and willingness to under diagnostic treatment. yes  Smoking Cessation Counseling:  Current Smokers:   Discussed importance of smoking cessation. yes  Information about tobacco cessation classes and interventions provided to patient. yes  Patient provided with "ticket" for LDCT Scan. yes  Symptomatic Patient. no  Counseling  Diagnosis Code: Tobacco Use Z72.0  Asymptomatic Patient yes  Counseling (Intermediate counseling: > three minutes counseling) U0454  Former Smokers:   Discussed the importance of maintaining cigarette abstinence. yes  Diagnosis Code: Personal History of Nicotine Dependence. U98.119  Information about tobacco cessation classes and interventions provided to patient. Yes  Patient provided with "ticket" for LDCT Scan. yes  Written Order for Lung Cancer  Screening with LDCT placed in Epic. Yes (CT Chest Lung Cancer Screening Low Dose W/O CM) [id number] Z12.2-Screening of respiratory organs Z87.891-Personal history of nicotine dependence   I spent 25 minutes of face to face time with Mr. Robenson discussing the risks and benefits of lung cancer screening. We viewed a power point together that explained in detail the above noted topics. We took the time to pause the power point at intervals to allow for questions to be asked and answered to ensure understanding. We discussed that he had taken the single most powerful action possible to decrease his risk of developing lung cancer when he quit smoking. I counseled him to remain smoke free, and to contact me if he ever had the desire to smoke again so that I can provide resources and tools to help support the effort to remain smoke free. We discussed the time and location of the scan, and that either  Doroteo Glassman RN or I will call with the results within  24-48 hours of receiving them. He has my card and contact information in the event he needs to speak with me, in addition to a copy of the power point we reviewed as a resource. He verbalized understanding of all of the above and had no further questions upon leaving the office.     I explained to the patient that there has been a high incidence of coronary artery disease noted on these exams. I explained that this is a non-gated exam therefore degree or severity cannot be determined. This patient is not  on statin therapy. I have asked the patient to follow-up with their PCP regarding any incidental finding of coronary artery disease and management with diet or medication as they feel  is clinically indicated. The patient verbalized understanding of the above and had no further questions.    Magdalen Spatz, NP 07/06/2017 3:42 PM

## 2017-07-09 ENCOUNTER — Other Ambulatory Visit: Payer: Self-pay | Admitting: Acute Care

## 2017-07-09 DIAGNOSIS — Z87891 Personal history of nicotine dependence: Secondary | ICD-10-CM

## 2017-07-09 DIAGNOSIS — Z122 Encounter for screening for malignant neoplasm of respiratory organs: Secondary | ICD-10-CM

## 2017-07-10 ENCOUNTER — Encounter (INDEPENDENT_AMBULATORY_CARE_PROVIDER_SITE_OTHER): Payer: Self-pay | Admitting: Orthopedic Surgery

## 2017-07-10 ENCOUNTER — Ambulatory Visit (INDEPENDENT_AMBULATORY_CARE_PROVIDER_SITE_OTHER): Payer: 59 | Admitting: Orthopedic Surgery

## 2017-07-10 ENCOUNTER — Ambulatory Visit (INDEPENDENT_AMBULATORY_CARE_PROVIDER_SITE_OTHER): Payer: Self-pay

## 2017-07-10 DIAGNOSIS — M2022 Hallux rigidus, left foot: Secondary | ICD-10-CM | POA: Diagnosis not present

## 2017-07-10 NOTE — Progress Notes (Signed)
Office Visit Note   Patient: [name]           Date of Birth: 01/01/1951           MRN: [id number] Visit Date: 07/10/2017              Requested by: Binnie Rail, MD [place], [place] [address] PCP: Binnie Rail, MD  Chief Complaint  Patient presents with  . Left Foot - Follow-up, Routine Post Op      HPI: Patient is a 67 year old gentleman who presents status post fusion left great toe MTP joint.  Currently ambulating and [place] sneakers he has no complaints.  Assessment & Plan: Visit Diagnoses:  1. Hallux rigidus, left foot     Plan: Patient was given instructions we will complete his paperwork to return to work in 2 weeks without restrictions.  Patient states he would like to proceed with fusion of the right great toe MTP joint in early August.  He will call to schedule this appointment.  Follow-Up Instructions: No follow-ups on file.   Ortho Exam  Patient is alert, oriented, no adenopathy, well-dressed, normal affect, normal respiratory effort. Examination there is no swelling around the left foot the incision is well-healed the effusion is nontender with attempted motion.  Patient has good pulses.  Imaging: Xr Foot Complete Left  Result Date: 07/10/2017 3 view radiographs of the left foot shows a stable MTP fusion no hardware failure  No images are attached to the encounter.  Labs: Lab Results  Component Value Date   HGBA1C 7.0 (H) 06/27/2017   HGBA1C 6.8 (H) 12/27/2016   HGBA1C 6.6 (H) 06/22/2016   ESRSEDRATE 6 04/03/2017   LABURIC 7.5 04/03/2017   REPTSTATUS 09/16/2010 FINAL 09/15/2010   CULT NO GROWTH 09/15/2010     Lab Results  Component Value Date   ALBUMIN 4.4 06/27/2017   ALBUMIN 4.2 12/27/2016   ALBUMIN 4.3 06/22/2016   LABURIC 7.5 04/03/2017    There is no height or weight on file to calculate BMI.  Orders:  Orders Placed This Encounter  Procedures  . XR Foot Complete Left   No orders of the defined types were  placed in this encounter.    Procedures: No procedures performed  Clinical Data: No additional findings.  ROS:  All other systems negative, except as noted in the HPI. Review of Systems  Objective: Vital Signs: There were no vitals taken for this visit.  Specialty Comments:  No specialty comments available.  PMFS History: Patient Active Problem List   Diagnosis Date Noted  . Hypertension 06/26/2017  . OSA (obstructive sleep apnea) 06/26/2017  . Tobacco abuse, in remission 06/26/2017  . Abnormal nuclear stress test 01/23/2017  . Type 2 diabetes mellitus with complication, without long-term current use of insulin ([place]) 06/22/2016  . Hypothyroidism 06/22/2016  . GERD (gastroesophageal reflux disease) 06/22/2016  . Status post cervical spinal fusion 05/23/2016  . Cervical spinal stenosis 04/05/2016  . Pain in right wrist 03/07/2016  . Neck pain 12/01/2015  . Other spondylosis with radiculopathy, cervical region 12/01/2015  . Diverticulitis of colon with perforation 10/20/2010   Past Medical History:  Diagnosis Date  . Arthritis   . Diabetes mellitus without complication ([facility] [facility])   . Diverticulitis of colon   . GERD (gastroesophageal reflux disease)   . Headache   . Hypertension   . Hypothyroidism   . Thyroid disease     Family History  Problem Relation Age of Onset  .  Hypertension Mother   . Diabetes Mother   . Heart attack Father   . Heart disease Father   . Diabetes Paternal Grandmother   . Cancer Paternal Grandfather        of eye    Past Surgical History:  Procedure Laterality Date  . ANKLE FRACTURE SURGERY    . ANTERIOR CERVICAL DECOMP/DISCECTOMY FUSION N/A 04/05/2016   Procedure: C5-6, C6-7 Anterior Cervical Discectomy and Fusion, Allograft, Plate;  Surgeon: Marybelle Killings, MD;  Location: [facility];  Service: Orthopedics;  Laterality: N/A;  . ANTERIOR FUSION CERVICAL SPINE  04/05/2016   c6 c7  . CARPAL TUNNEL RELEASE Bilateral   . 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 JOINT REPLACEMENT  11/10   right knee replaced   . KNEE SURGERY    . LEFT HEART CATH AND CORONARY ANGIOGRAPHY N/A 02/06/2017   Procedure: LEFT HEART CATH AND CORONARY ANGIOGRAPHY;  Surgeon: Belva Crome, MD;  Location: [facility];  Service: Cardiovascular;  Laterality: N/A;  . LEG SURGERY  11/03   broken lower right leg crushed    Social History   Occupational History  . Not on file  Tobacco Use  . Smoking status: Former Smoker    Types: Cigarettes    Last attempt to quit: 01/10/2008    Years since quitting: 9.5  . Smokeless tobacco: Former Systems developer    Types: Chew  Substance and Sexual Activity  . Alcohol use: Yes    Comment: occasionally  . Drug use: No  . Sexual activity: Not on file

## 2017-07-13 ENCOUNTER — Telehealth: Payer: Self-pay

## 2017-07-13 NOTE — Telephone Encounter (Signed)
Copied from [facility] [phone]. Topic: General - Other >> Jul 13, 2017  8:24 AM Keene Breath wrote: Reason for CRM: Patient called to speak with the nurse or doctor regarding a sinus infection.  He would like something to be called into the pharmacy to relieve his symptoms.  CB# [phone].  >> Jul 13, 2017  8:30 AM Morphies, Isidoro Donning wrote: Can you help with this?  Patient states he had a lot of sinus pressure and headache yesterday, but this morning, in the shower, was able to blow a lot of congestion/mucus out and pressure is much better, mucus did not have any color--afebrile---headache better--patient is ok with waiting to call in anything right now, since he is feeling better, and he understands it's not good to constantly take abx, patient is going to wait and see if he continues to improve before we call anything in---patient advised that we do have Saturday clinic, starting at 9am tomorrow, he can call in and make appt if symptoms worsen

## 2017-07-17 DIAGNOSIS — G4733 Obstructive sleep apnea (adult) (pediatric): Secondary | ICD-10-CM | POA: Diagnosis not present

## 2017-07-19 ENCOUNTER — Other Ambulatory Visit: Payer: Self-pay | Admitting: *Deleted

## 2017-07-19 DIAGNOSIS — G4733 Obstructive sleep apnea (adult) (pediatric): Secondary | ICD-10-CM

## 2017-07-23 ENCOUNTER — Telehealth: Payer: Self-pay | Admitting: Pulmonary Disease

## 2017-07-23 DIAGNOSIS — G4733 Obstructive sleep apnea (adult) (pediatric): Secondary | ICD-10-CM

## 2017-07-23 NOTE — Telephone Encounter (Signed)
Per RA, HST shows moderate OSA with 23 events per hour. Severe OSA when lying in the supine position. Recommends RX auto cpap 5-15cm, OV in 6 weeks.

## 2017-07-26 NOTE — Telephone Encounter (Signed)
Spoke with patient. He is aware of results. He wishes to proceed with CPAP machine. Advised him that I would place the order today. He wishes to receive a call in the next few weeks to schedule the follow up appt with RA.   Nothing else needed at time.

## 2017-07-27 ENCOUNTER — Other Ambulatory Visit: Payer: Self-pay | Admitting: Pulmonary Disease

## 2017-07-27 ENCOUNTER — Telehealth (INDEPENDENT_AMBULATORY_CARE_PROVIDER_SITE_OTHER): Payer: Self-pay | Admitting: Orthopedic Surgery

## 2017-07-27 DIAGNOSIS — G4733 Obstructive sleep apnea (adult) (pediatric): Secondary | ICD-10-CM

## 2017-07-27 NOTE — Telephone Encounter (Signed)
[name] called in to schedule surgery for his right foot.  You discussed this at his last office visit.  Please fill out surgery sheet and we will schedule.  He is looking for either 08/21/17 or 08/28/17. Thank you!

## 2017-07-27 NOTE — Progress Notes (Signed)
Patients daughter called in regarding CPAP machine. [facility] and needed to [facility] to [facility]. New order placed. Nothing further needed.

## 2017-07-31 NOTE — Telephone Encounter (Signed)
Blue sheet written for fusion right GT MTP

## 2017-08-07 NOTE — Telephone Encounter (Signed)
I spoke with [name].  He is scheduled for surgery on  8/13/20219 at the [facility]. All surgery information given to patient.

## 2017-08-21 DIAGNOSIS — M2021 Hallux rigidus, right foot: Secondary | ICD-10-CM | POA: Diagnosis not present

## 2017-08-30 ENCOUNTER — Encounter (INDEPENDENT_AMBULATORY_CARE_PROVIDER_SITE_OTHER): Payer: Self-pay | Admitting: Orthopedic Surgery

## 2017-08-30 ENCOUNTER — Ambulatory Visit (INDEPENDENT_AMBULATORY_CARE_PROVIDER_SITE_OTHER): Payer: 59 | Admitting: Orthopedic Surgery

## 2017-08-30 VITALS — Ht 69.0 in | Wt 195.0 lb

## 2017-08-30 DIAGNOSIS — M2022 Hallux rigidus, left foot: Secondary | ICD-10-CM

## 2017-08-30 DIAGNOSIS — M2021 Hallux rigidus, right foot: Secondary | ICD-10-CM

## 2017-08-30 NOTE — Progress Notes (Signed)
Office Visit Note   Patient: [name]           Date of Birth: [birth year]           MRN: [id number] Visit Date: 08/30/2017              Requested by: Binnie Rail, MD [place], [place] [address] PCP: Binnie Rail, MD  Chief Complaint  Patient presents with  . Left Foot - Follow-up    05/15/17 left GT MTP fusion   . Right Foot - Routine Post Op    08/21/17 right GT MTP fusion       HPI: Patient is a 67 year old gentleman who presents in follow-up status post right great toe MTP fusion he has 1 week out.  Assessment & Plan: Visit Diagnoses:  1. Hallux rigidus, left foot   2. Hallux rigidus of right foot     Plan: Discussed the importance of elevation ice minimizing activities to decrease the swelling.  Recommended not driving.  Follow-Up Instructions: Return in about 1 week (around 09/06/2017).   Ortho Exam  Patient is alert, oriented, no adenopathy, well-dressed, normal affect, normal respiratory effort. Examination patient has increased swelling with gaping of the wound due to the swelling there is no cellulitis no odor no drainage no signs of infection.  Again reinforced the importance of elevation ice and minimizing his activities.  Patient is currently using a kneeling scooter.  Imaging: No results found. No images are attached to the encounter.  Labs: Lab Results  Component Value Date   HGBA1C 7.0 (H) 06/27/2017   HGBA1C 6.8 (H) 12/27/2016   HGBA1C 6.6 (H) 06/22/2016   ESRSEDRATE 6 04/03/2017   LABURIC 7.5 04/03/2017   REPTSTATUS 09/16/2010 FINAL 09/15/2010   CULT NO GROWTH 09/15/2010     Lab Results  Component Value Date   ALBUMIN 4.4 06/27/2017   ALBUMIN 4.2 12/27/2016   ALBUMIN 4.3 06/22/2016   LABURIC 7.5 04/03/2017    Body mass index is 28.8 kg/m.  Orders:  No orders of the defined types were placed in this encounter.  No orders of the defined types were placed in this encounter.    Procedures: No procedures  performed  Clinical Data: No additional findings.  ROS:  All other systems negative, except as noted in the HPI. Review of Systems  Objective: Vital Signs: Ht 5\' 9"  (1.753 m)   Wt 195 lb (88.5 kg)   BMI 28.80 kg/m   Specialty Comments:  No specialty comments available.  PMFS History: Patient Active Problem List   Diagnosis Date Noted  . Hallux rigidus of right foot 08/30/2017  . Hypertension 06/26/2017  . OSA (obstructive sleep apnea) 06/26/2017  . Tobacco abuse, in remission 06/26/2017  . Abnormal nuclear stress test 01/23/2017  . Type 2 diabetes mellitus with complication, without long-term current use of insulin ([facility]) 06/22/2016  . Hypothyroidism 06/22/2016  . GERD (gastroesophageal reflux disease) 06/22/2016  . Status post cervical spinal fusion 05/23/2016  . Cervical spinal stenosis 04/05/2016  . Pain in right wrist 03/07/2016  . Neck pain 12/01/2015  . Other spondylosis with radiculopathy, cervical region 12/01/2015  . Diverticulitis of colon with perforation 10/20/2010   Past Medical History:  Diagnosis Date  . Arthritis   . Diabetes mellitus without complication ([facility])   . Diverticulitis of colon   . GERD (gastroesophageal reflux disease)   . Headache   . Hypertension   . Hypothyroidism   . Thyroid disease  Family History  Problem Relation Age of Onset  . Hypertension Mother   . Diabetes Mother   . Heart attack Father   . Heart disease Father   . Diabetes Paternal Grandmother   . Cancer Paternal Grandfather        of eye    Past Surgical History:  Procedure Laterality Date  . ANKLE FRACTURE SURGERY    . ANTERIOR CERVICAL DECOMP/DISCECTOMY FUSION N/A 04/05/2016   Procedure: C5-6, C6-7 Anterior Cervical Discectomy and Fusion, Allograft, Plate;  Surgeon: Marybelle Killings, MD;  Location: [facility];  Service: Orthopedics;  Laterality: N/A;  . ANTERIOR FUSION CERVICAL SPINE  04/05/2016   c6 c7  . CARPAL TUNNEL RELEASE Bilateral   . 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 JOINT REPLACEMENT  11/10   right knee replaced   . KNEE SURGERY    . LEFT HEART CATH AND CORONARY ANGIOGRAPHY N/A 02/06/2017   Procedure: LEFT HEART CATH AND CORONARY ANGIOGRAPHY;  Surgeon: Belva Crome, MD;  Location: [facility];  Service: Cardiovascular;  Laterality: N/A;  . LEG SURGERY  11/03   broken lower right leg crushed    Social History   Occupational History  . Not on file  Tobacco Use  . Smoking status: Former Smoker    Types: Cigarettes    Last attempt to quit: 01/10/2008    Years since quitting: 9.6  . Smokeless tobacco: Former Systems developer    Types: Chew  Substance and Sexual Activity  . Alcohol use: Yes    Comment: occasionally  . Drug use: No  . Sexual activity: Not on file

## 2017-09-04 ENCOUNTER — Other Ambulatory Visit: Payer: Self-pay | Admitting: Pulmonary Disease

## 2017-09-04 DIAGNOSIS — G4733 Obstructive sleep apnea (adult) (pediatric): Secondary | ICD-10-CM

## 2017-09-04 NOTE — Progress Notes (Signed)
New order for CPAP sent

## 2017-09-06 ENCOUNTER — Encounter (INDEPENDENT_AMBULATORY_CARE_PROVIDER_SITE_OTHER): Payer: Self-pay | Admitting: Orthopedic Surgery

## 2017-09-06 ENCOUNTER — Ambulatory Visit (INDEPENDENT_AMBULATORY_CARE_PROVIDER_SITE_OTHER): Payer: 59 | Admitting: Orthopedic Surgery

## 2017-09-06 DIAGNOSIS — M2021 Hallux rigidus, right foot: Secondary | ICD-10-CM

## 2017-09-06 NOTE — Progress Notes (Signed)
Office Visit Note   Patient: [name]           Date of Birth: 1950-07-08           MRN: [id number] Visit Date: 09/06/2017              Requested by: Binnie Rail, MD [place], [place] [address] PCP: Binnie Rail, MD  Chief Complaint  Patient presents with  . Right Great Toe - Routine Post Op      HPI: Patient is a 67 year old gentleman status post fusion right great toe MTP joint for hallux rigidus.  Has no complaints.  Assessment & Plan: Visit Diagnoses:  1. Hallux rigidus of right foot     Plan: Harper sutures today apply Steri-Strips start scar massage continue nonweightbearing continue elevation start Dial soap cleansing.  2 view radiographs of the right foot at follow-up.  Follow-Up Instructions: Return in about 2 weeks (around 09/20/2017).   Ortho Exam  Patient is alert, oriented, no adenopathy, well-dressed, normal affect, normal respiratory effort. Examination patient has minimal swelling he has a good pulse the incision is well-healed.  The great toe is in good alignment no redness no cellulitis no signs of infection.  Imaging: No results found. No images are attached to the encounter.  Labs: Lab Results  Component Value Date   HGBA1C 7.0 (H) 06/27/2017   HGBA1C 6.8 (H) 12/27/2016   HGBA1C 6.6 (H) 06/22/2016   ESRSEDRATE 6 04/03/2017   LABURIC 7.5 04/03/2017   REPTSTATUS 09/16/2010 FINAL 09/15/2010   CULT NO GROWTH 09/15/2010     Lab Results  Component Value Date   ALBUMIN 4.4 06/27/2017   ALBUMIN 4.2 12/27/2016   ALBUMIN 4.3 06/22/2016   LABURIC 7.5 04/03/2017    There is no height or weight on file to calculate BMI.  Orders:  No orders of the defined types were placed in this encounter.  No orders of the defined types were placed in this encounter.    Procedures: No procedures performed  Clinical Data: No additional findings.  ROS:  All other systems negative, except as noted in the HPI. Review of  Systems  Objective: Vital Signs: There were no vitals taken for this visit.  Specialty Comments:  No specialty comments available.  PMFS History: Patient Active Problem List   Diagnosis Date Noted  . Hallux rigidus of right foot 08/30/2017  . Hypertension 06/26/2017  . OSA (obstructive sleep apnea) 06/26/2017  . Tobacco abuse, in remission 06/26/2017  . Abnormal nuclear stress test 01/23/2017  . Type 2 diabetes mellitus with complication, without long-term current use of insulin ([facility]) 06/22/2016  . Hypothyroidism 06/22/2016  . GERD (gastroesophageal reflux disease) 06/22/2016  . Status post cervical spinal fusion 05/23/2016  . Cervical spinal stenosis 04/05/2016  . Pain in right wrist 03/07/2016  . Neck pain 12/01/2015  . Other spondylosis with radiculopathy, cervical region 12/01/2015  . Diverticulitis of colon with perforation 10/20/2010   Past Medical History:  Diagnosis Date  . Arthritis   . Diabetes mellitus without complication ([facility])   . Diverticulitis of colon   . GERD (gastroesophageal reflux disease)   . Headache   . Hypertension   . Hypothyroidism   . Thyroid disease     Family History  Problem Relation Age of Onset  . Hypertension Mother   . Diabetes Mother   . Heart attack Father   . Heart disease Father   . Diabetes Paternal Grandmother   . Cancer  Paternal Grandfather        of eye    Past Surgical History:  Procedure Laterality Date  . ANKLE FRACTURE SURGERY    . ANTERIOR CERVICAL DECOMP/DISCECTOMY FUSION N/A 04/05/2016   Procedure: C5-6, C6-7 Anterior Cervical Discectomy and Fusion, Allograft, Plate;  Surgeon: Marybelle Killings, MD;  Location: [facility];  Service: Orthopedics;  Laterality: N/A;  . ANTERIOR FUSION CERVICAL SPINE  04/05/2016   c6 c7  . CARPAL TUNNEL RELEASE Bilateral   . 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 JOINT REPLACEMENT  11/10   right knee replaced   . KNEE SURGERY    . LEFT HEART CATH AND CORONARY ANGIOGRAPHY N/A  02/06/2017   Procedure: LEFT HEART CATH AND CORONARY ANGIOGRAPHY;  Surgeon: Belva Crome, MD;  Location: [facility];  Service: Cardiovascular;  Laterality: N/A;  . LEG SURGERY  11/03   broken lower right leg crushed    Social History   Occupational History  . Not on file  Tobacco Use  . Smoking status: Former Smoker    Types: Cigarettes    Last attempt to quit: 01/10/2008    Years since quitting: 9.6  . Smokeless tobacco: Former Systems developer    Types: Chew  Substance and Sexual Activity  . Alcohol use: Yes    Comment: occasionally  . Drug use: No  . Sexual activity: Not on file

## 2017-09-24 ENCOUNTER — Ambulatory Visit (INDEPENDENT_AMBULATORY_CARE_PROVIDER_SITE_OTHER): Payer: 59

## 2017-09-24 ENCOUNTER — Encounter (INDEPENDENT_AMBULATORY_CARE_PROVIDER_SITE_OTHER): Payer: Self-pay | Admitting: Orthopedic Surgery

## 2017-09-24 ENCOUNTER — Ambulatory Visit (INDEPENDENT_AMBULATORY_CARE_PROVIDER_SITE_OTHER): Payer: 59 | Admitting: Orthopedic Surgery

## 2017-09-24 DIAGNOSIS — M2021 Hallux rigidus, right foot: Secondary | ICD-10-CM | POA: Diagnosis not present

## 2017-09-24 NOTE — Progress Notes (Signed)
Office Visit Note   Patient: [name]           Date of Birth: 06-23-1950           MRN: [id number] Visit Date: 09/24/2017              Requested by: Binnie Rail, MD [place], [place] [address] PCP: Binnie Rail, MD  Chief Complaint  Patient presents with  . Right Foot - Follow-up      HPI: Patient is a 67 year old gentleman who presents over 2 months out from MTP fusion right foot for hallux rigidus.  Patient is still in the postoperative shoe and using crutches.  Assessment & Plan: Visit Diagnoses:  1. Hallux rigidus of right foot     Plan: Patient will advance to his [place] sneaker wean off the crutches to a cane reevaluated 4 weeks.  Will evaluate for return to work after follow-up.  Follow-Up Instructions: Return in about 4 weeks (around 10/22/2017).   Ortho Exam  Patient is alert, oriented, no adenopathy, well-dressed, normal affect, normal respiratory effort. Examination the incision is healing nicely there was one area of a small scab in the mid aspect of the incision but there is no drainage at this time there is no redness no cellulitis there is minimal swelling.  The fusion is stable.  Imaging: Xr Foot Complete Right  Result Date: 09/24/2017 3 view radiographs of the right foot shows good stable healing of the MTP fusion with no failure of the hardware.  No images are attached to the encounter.  Labs: Lab Results  Component Value Date   HGBA1C 7.0 (H) 06/27/2017   HGBA1C 6.8 (H) 12/27/2016   HGBA1C 6.6 (H) 06/22/2016   ESRSEDRATE 6 04/03/2017   LABURIC 7.5 04/03/2017   REPTSTATUS 09/16/2010 FINAL 09/15/2010   CULT NO GROWTH 09/15/2010     Lab Results  Component Value Date   ALBUMIN 4.4 06/27/2017   ALBUMIN 4.2 12/27/2016   ALBUMIN 4.3 06/22/2016   LABURIC 7.5 04/03/2017    There is no height or weight on file to calculate BMI.  Orders:  Orders Placed This Encounter  Procedures  . XR Foot Complete Right   No orders  of the defined types were placed in this encounter.    Procedures: No procedures performed  Clinical Data: No additional findings.  ROS:  All other systems negative, except as noted in the HPI. Review of Systems  Objective: Vital Signs: There were no vitals taken for this visit.  Specialty Comments:  No specialty comments available.  PMFS History: Patient Active Problem List   Diagnosis Date Noted  . Hallux rigidus of right foot 08/30/2017  . Hypertension 06/26/2017  . OSA (obstructive sleep apnea) 06/26/2017  . Tobacco abuse, in remission 06/26/2017  . Abnormal nuclear stress test 01/23/2017  . Type 2 diabetes mellitus with complication, without long-term current use of insulin ([place]) 06/22/2016  . Hypothyroidism 06/22/2016  . GERD (gastroesophageal reflux disease) 06/22/2016  . Status post cervical spinal fusion 05/23/2016  . Cervical spinal stenosis 04/05/2016  . Pain in right wrist 03/07/2016  . Neck pain 12/01/2015  . Other spondylosis with radiculopathy, cervical region 12/01/2015  . Diverticulitis of colon with perforation 10/20/2010   Past Medical History:  Diagnosis Date  . Arthritis   . Diabetes mellitus without complication ([place])   . Diverticulitis of colon   . GERD (gastroesophageal reflux disease)   . Headache   . Hypertension   .  Hypothyroidism   . Thyroid disease     Family History  Problem Relation Age of Onset  . Hypertension Mother   . Diabetes Mother   . Heart attack Father   . Heart disease Father   . Diabetes Paternal Grandmother   . Cancer Paternal Grandfather        of eye    Past Surgical History:  Procedure Laterality Date  . ANKLE FRACTURE SURGERY    . ANTERIOR CERVICAL DECOMP/DISCECTOMY FUSION N/A 04/05/2016   Procedure: C5-6, C6-7 Anterior Cervical Discectomy and Fusion, Allograft, Plate;  Surgeon: Marybelle Killings, MD;  Location: [facility];  Service: Orthopedics;  Laterality: N/A;  . ANTERIOR FUSION CERVICAL SPINE  04/05/2016   c6 c7   . CARPAL TUNNEL RELEASE Bilateral   . 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 JOINT REPLACEMENT  11/10   right knee replaced   . KNEE SURGERY    . LEFT HEART CATH AND CORONARY ANGIOGRAPHY N/A 02/06/2017   Procedure: LEFT HEART CATH AND CORONARY ANGIOGRAPHY;  Surgeon: Belva Crome, MD;  Location: [facility];  Service: Cardiovascular;  Laterality: N/A;  . LEG SURGERY  11/03   broken lower right leg crushed    Social History   Occupational History  . Not on file  Tobacco Use  . Smoking status: Former Smoker    Types: Cigarettes    Last attempt to quit: 01/10/2008    Years since quitting: 9.7  . Smokeless tobacco: Former Systems developer    Types: Chew  Substance and Sexual Activity  . Alcohol use: Yes    Comment: occasionally  . Drug use: No  . Sexual activity: Not on file

## 2017-10-12 ENCOUNTER — Ambulatory Visit (INDEPENDENT_AMBULATORY_CARE_PROVIDER_SITE_OTHER): Payer: 59

## 2017-10-12 DIAGNOSIS — Z23 Encounter for immunization: Secondary | ICD-10-CM

## 2017-10-12 NOTE — Progress Notes (Signed)
Injection given.   Amayrani Bennick J Lean Fayson, MD  

## 2017-10-25 ENCOUNTER — Ambulatory Visit: Payer: 59

## 2017-10-25 ENCOUNTER — Encounter (INDEPENDENT_AMBULATORY_CARE_PROVIDER_SITE_OTHER): Payer: Self-pay | Admitting: Orthopedic Surgery

## 2017-10-25 ENCOUNTER — Ambulatory Visit (INDEPENDENT_AMBULATORY_CARE_PROVIDER_SITE_OTHER): Payer: 59 | Admitting: Orthopedic Surgery

## 2017-10-25 VITALS — Ht 69.0 in | Wt 195.0 lb

## 2017-10-25 DIAGNOSIS — M2021 Hallux rigidus, right foot: Secondary | ICD-10-CM

## 2017-10-25 NOTE — Progress Notes (Signed)
Office Visit Note   Patient: [name]           Date of Birth: 1950/04/28           MRN: [id number] Visit Date: 10/25/2017              Requested by: Binnie Rail, MD [place], [place] [address] PCP: Binnie Rail, MD  Chief Complaint  Patient presents with  . Right Foot - Follow-up      HPI: Patient is a 67 year old gentleman status post right great toe MTP fusion.  Patient feels like there is some prominence of the hardware dorsally.  Assessment & Plan: Visit Diagnoses:  1. Hallux rigidus of right foot     Plan: Patient is progressing well.  We will follow-up in 4 weeks at that time anticipate return to work.  Patient may be weightbearing as tolerated.  Follow-Up Instructions: Return in about 4 weeks (around 11/22/2017).   Ortho Exam  Patient is alert, oriented, no adenopathy, well-dressed, normal affect, normal respiratory effort. Examination the incision is well-healed there is no prominence of the hardware palpable.  There is no skin breakdown from the hardware.  There is no pain with attempted range of motion of the MTP joint.  The great toe just touches the second toe there is no overlapping no gap.  Patient has approximal 5 degrees of dorsiflexion of the great toe.  Imaging: No results found. No images are attached to the encounter.  Labs: Lab Results  Component Value Date   HGBA1C 7.0 (H) 06/27/2017   HGBA1C 6.8 (H) 12/27/2016   HGBA1C 6.6 (H) 06/22/2016   ESRSEDRATE 6 04/03/2017   LABURIC 7.5 04/03/2017   REPTSTATUS 09/16/2010 FINAL 09/15/2010   CULT NO GROWTH 09/15/2010     Lab Results  Component Value Date   ALBUMIN 4.4 06/27/2017   ALBUMIN 4.2 12/27/2016   ALBUMIN 4.3 06/22/2016   LABURIC 7.5 04/03/2017    Body mass index is 28.8 kg/m.  Orders:  No orders of the defined types were placed in this encounter.  No orders of the defined types were placed in this encounter.    Procedures: No procedures  performed  Clinical Data: No additional findings.  ROS:  All other systems negative, except as noted in the HPI. Review of Systems  Objective: Vital Signs: Ht 5\' 9"  (1.753 m)   Wt 195 lb (88.5 kg)   BMI 28.80 kg/m   Specialty Comments:  No specialty comments available.  PMFS History: Patient Active Problem List   Diagnosis Date Noted  . Hallux rigidus of right foot 08/30/2017  . Hypertension 06/26/2017  . OSA (obstructive sleep apnea) 06/26/2017  . Tobacco abuse, in remission 06/26/2017  . Abnormal nuclear stress test 01/23/2017  . Type 2 diabetes mellitus with complication, without long-term current use of insulin ([facility]) 06/22/2016  . Hypothyroidism 06/22/2016  . GERD (gastroesophageal reflux disease) 06/22/2016  . Status post cervical spinal fusion 05/23/2016  . Cervical spinal stenosis 04/05/2016  . Pain in right wrist 03/07/2016  . Neck pain 12/01/2015  . Other spondylosis with radiculopathy, cervical region 12/01/2015  . Diverticulitis of colon with perforation 10/20/2010   Past Medical History:  Diagnosis Date  . Arthritis   . Diabetes mellitus without complication ([place])   . Diverticulitis of colon   . GERD (gastroesophageal reflux disease)   . Headache   . Hypertension   . Hypothyroidism   . Thyroid disease     Family  History  Problem Relation Age of Onset  . Hypertension Mother   . Diabetes Mother   . Heart attack Father   . Heart disease Father   . Diabetes Paternal Grandmother   . Cancer Paternal Grandfather        of eye    Past Surgical History:  Procedure Laterality Date  . ANKLE FRACTURE SURGERY    . ANTERIOR CERVICAL DECOMP/DISCECTOMY FUSION N/A 04/05/2016   Procedure: C5-6, C6-7 Anterior Cervical Discectomy and Fusion, Allograft, Plate;  Surgeon: Marybelle Killings, MD;  Location: [facility];  Service: Orthopedics;  Laterality: N/A;  . ANTERIOR FUSION CERVICAL SPINE  04/05/2016   c6 c7  . CARPAL TUNNEL RELEASE Bilateral   . 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 JOINT REPLACEMENT  11/10   right knee replaced   . KNEE SURGERY    . LEFT HEART CATH AND CORONARY ANGIOGRAPHY N/A 02/06/2017   Procedure: LEFT HEART CATH AND CORONARY ANGIOGRAPHY;  Surgeon: Belva Crome, MD;  Location: [facility];  Service: Cardiovascular;  Laterality: N/A;  . LEG SURGERY  11/03   broken lower right leg crushed    Social History   Occupational History  . Not on file  Tobacco Use  . Smoking status: Former Smoker    Types: Cigarettes    Last attempt to quit: 01/10/2008    Years since quitting: 9.7  . Smokeless tobacco: Former Systems developer    Types: Chew  Substance and Sexual Activity  . Alcohol use: Yes    Comment: occasionally  . Drug use: No  . Sexual activity: Not on file

## 2017-11-11 NOTE — Progress Notes (Signed)
@Patient  ID: [name], male    DOB: [birth year], 67 y.o.   MRN: [id number]  Chief Complaint  Patient presents with  . Follow-up    OSA follow up on cpap    Referring provider: Binnie Rail, MD  HPI:  67 year old male former smoker followed in our office for obstructive sleep apnea.  PMH: Diabetes, hypertension, CAD Smoker/ Smoking History: Former smoker.  Quit 2011.  80-pack-year smoking history, lung RADS 1 July/2019. Maintenance:   Pt of: Dr. Elsworth Soho  Recent [facility] Encounters:   06/26/2017-initial office visit-Alva 67 year old patient following up with our office for sleep disordered breathing.  Epworth score today is 12. Plan: Home sleep study   11/12/2017  - Visit   67 year old male patient presenting today for follow-up after starting CPAP.  Patient likes his CPAP.  Patient does report that he stopped using a humidifier as he said that this stopped him up more.  Patient feels that he cannot sleep without using his CPAP.  Patient has no issues with his mask.  He says it fits him well.  Patient likes the current settings of the CPAP he does not think anything needs to be changed.  CPAP compliance report today showing 29 out of 30 days use, 28 of those days greater than 4 hours, average usage 7 hours and 33 minutes.  APAP settings 5-15.  95th percentile 11.6.  AHI 4.8.  Patient reports that he is been trying to focus on his positions when he sleeps.  Avoiding supine sleep.  As he had more events when he was lying supine.  Patient is in our lung cancer screening program he is plans to repeat a low-dose CT screening in July/2020.  Lung RADS 1 on this year CT.  Patient denies any dyspnea or fatigue.  Patient reports that he typically walks 1 mile a day without having to take breaks with his dog in the morning.  Patient reports he continues to not smoke.  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  Patient reports he  works in well ventilated areas.   Tests:  07/17/2017-Home sleep study- AHI 23.5 an hour, SaO2 low 77% 07/06/2017-CT chest lung cancer screening- lung RADS 1, annual screening CT without contrast in 12 months >>>Mild centrilobular emphysematous changes, upper lobe predominant mild subpleural reticulation/fibrosis and posterior right lower lobe  FENO:  No results found for: NITRICOXIDE  PFT: No flowsheet data found.  Imaging: No results found.  Chart Review:    Specialty Problems      Pulmonary Problems   OSA (obstructive sleep apnea)      Allergies  Allergen Reactions  . Morphine Other (See Comments)    Severe headache    Immunization History  Administered Date(s) Administered  . Influenza, High Dose Seasonal PF 10/12/2017  . Pneumococcal Polysaccharide-23 11/12/2017  . Tdap 06/27/2017    Past Medical History:  Diagnosis Date  . Arthritis   . Diabetes mellitus without complication ([place])   . Diverticulitis of colon   . GERD (gastroesophageal reflux disease)   . Headache   . Hypertension   . Hypothyroidism   . Thyroid disease     Tobacco History: Social History   Tobacco Use  Smoking Status Former Smoker  . Types: Cigarettes  . Last attempt to quit: 01/10/2008  . Years since quitting: 9.8  Smokeless Tobacco Former Systems developer  . Types: Chew   Counseling given: Not Answered  Continue  to not smoke  Outpatient Encounter Medications as of 11/12/2017  Medication Sig  . Ascorbic Acid (VITAMIN C WITH ROSE HIPS) 500 MG tablet Take 1,000 mg by mouth daily.  Marland Kitchen aspirin EC 81 MG tablet Take 81 mg by mouth daily.  . B Complex-C (SUPER B COMPLEX PO) Take 1 tablet by mouth daily.  . Calcium Carbonate-Vitamin D (CALCIUM-VITAMIN D3 PO) Take 1 tablet by mouth every other day.  . Cholecalciferol (VITAMIN D3) 1000 units CAPS Take 1,000 Units by mouth daily.  . Garlic 3086 MG CAPS Take 1,000 mg by mouth every other day.  . levothyroxine (SYNTHROID, LEVOTHROID) 100 MCG tablet  Take 1 tablet (100 mcg total) by mouth daily before breakfast.  . losartan-hydrochlorothiazide (HYZAAR) 100-12.5 MG tablet Take 1 tablet by mouth daily after breakfast.  . Magnesium 100 MG TABS Take 100 mg by mouth every other day.  . metFORMIN (GLUCOPHAGE-XR) 500 MG 24 hr tablet Take 2 tablets (1,000 mg total) by mouth at bedtime.  . Multiple Vitamins-Minerals (MULTIVITAMIN ADULTS 50+ PO) Take 1 tablet by mouth daily.  . Omega-3 Fatty Acids (OMEGA 3 PO) Take 3 capsules by mouth daily.  . pantoprazole (PROTONIX) 20 MG tablet Take 1 tablet (20 mg total) by mouth daily after breakfast.  . vitamin E 400 UNIT capsule Take 400 Units by mouth every other day.  . [DISCONTINUED] methocarbamol (ROBAXIN) 500 MG tablet Take 1 tablet (500 mg total) by mouth every 8 (eight) hours as needed for muscle spasms. (Patient not taking: Reported on 11/12/2017)   No facility-administered encounter medications on file as of 11/12/2017.      Review of Systems  Review of Systems  Constitutional: Negative for activity change, chills, fatigue, fever and unexpected weight change.  HENT: Negative for congestion, postnasal drip and rhinorrhea.   Eyes: Negative.   Respiratory: Negative for cough, shortness of breath and wheezing.   Cardiovascular: Negative for chest pain and palpitations.  Gastrointestinal: Negative for constipation, diarrhea, nausea and vomiting.  Endocrine: Negative.   Musculoskeletal: Negative.   Skin: Negative.   Neurological: Negative for dizziness and headaches.  Psychiatric/Behavioral: Negative.  Negative for dysphoric mood. The patient is not nervous/anxious.   All other systems reviewed and are negative.    Physical Exam  BP 130/64 (BP Location: Left Arm, Cuff Size: Normal)   Pulse 62   Ht 5\' 11"  (1.803 m)   Wt 203 lb 9.6 oz (92.4 kg)   SpO2 98%   BMI 28.40 kg/m   Wt Readings from Last 5 Encounters:  11/12/17 203 lb 9.6 oz (92.4 kg)  10/25/17 195 lb (88.5 kg)  08/30/17 195 lb  (88.5 kg)  06/27/17 195 lb (88.5 kg)  06/26/17 193 lb (87.5 kg)    Physical Exam  Constitutional: He is oriented to person, place, and time and well-developed, well-nourished, and in no distress. No distress.  HENT:  Head: Normocephalic and atraumatic.  Right Ear: Hearing, tympanic membrane, external ear and ear canal normal.  Left Ear: Hearing, tympanic membrane, external ear and ear canal normal.  Nose: Mucosal edema present. Right sinus exhibits no maxillary sinus tenderness and no frontal sinus tenderness. Left sinus exhibits no maxillary sinus tenderness and no frontal sinus tenderness.  Mouth/Throat: Uvula is midline and oropharynx is clear and moist. No oropharyngeal exudate.  Mallampati 2  Eyes: Pupils are equal, round, and reactive to light.  Neck: Normal range of motion. Neck supple.  Cardiovascular: Normal rate, regular rhythm and normal heart sounds.  Pulmonary/Chest: Effort normal and  breath sounds normal. No accessory muscle usage. No respiratory distress. He has no decreased breath sounds. He has no wheezes. He has no rhonchi. He has no rales.  Musculoskeletal: Normal range of motion. He exhibits no edema.  Lymphadenopathy:    He has no cervical adenopathy.  Neurological: He is alert and oriented to person, place, and time. Gait normal.  Skin: Skin is warm and dry. He is not diaphoretic. No erythema.  Psychiatric: Mood, memory, affect and judgment normal.  Nursing note and vitals reviewed.     Lab Results:  CBC    Component Value Date/Time   WBC 6.1 04/03/2017 1100   RBC 4.67 04/03/2017 1100   HGB 13.8 04/03/2017 1100   HGB 12.8 (L) 01/30/2017 1600   HCT 40.5 04/03/2017 1100   HCT 37.6 01/30/2017 1600   PLT 307 04/03/2017 1100   PLT 315 01/30/2017 1600   MCV 86.7 04/03/2017 1100   MCV 86 01/30/2017 1600   MCH 29.6 04/03/2017 1100   MCHC 34.1 04/03/2017 1100   RDW 12.9 04/03/2017 1100   RDW 13.7 01/30/2017 1600   LYMPHSABS 1.8 12/27/2016 0854   MONOABS  0.5 12/27/2016 0854   EOSABS 0.5 12/27/2016 0854   BASOSABS 0.1 12/27/2016 0854    BMET    Component Value Date/Time   NA 140 06/27/2017 0827   NA 139 01/30/2017 1600   K 4.1 06/27/2017 0827   CL 104 06/27/2017 0827   CO2 28 06/27/2017 0827   GLUCOSE 123 (H) 06/27/2017 0827   BUN 26 (H) 06/27/2017 0827   BUN 25 01/30/2017 1600   CREATININE 1.19 06/27/2017 0827   CALCIUM 9.8 06/27/2017 0827   GFRNONAA 71 01/30/2017 1600   GFRAA 82 01/30/2017 1600    BNP No results found for: BNP  ProBNP No results found for: PROBNP    Assessment & Plan:   67 year old male patient completing follow-up today but will keep CPAP settings the same.  Patient to get Pneumovax 23 today.  Patient to keep follow-up with primary care in December to receive the first dose of Shingrix.  Patient to follow-up with our office in 1 year.  OSA (obstructive sleep apnea) Keep follow up with PCP in Dec/2019  We recommend that you continue using your CPAP daily >>>Keep up the hard work using your device >>> Goal should be wearing this for the entire night that you are sleeping, at least 4 to 6 hours  Remember:  . Do not drive or operate heavy machinery if tired or drowsy.  . Please notify the supply company and office if you are unable to use your device regularly due to missing supplies or machine being broken.  . Work on maintaining a healthy weight and following your recommended nutrition plan  . Maintain proper daily exercise and movement  . Maintaining proper use of your device can also help improve management of other chronic illnesses such as: Blood pressure, blood sugars, and weight management.   BiPAP/ CPAP Cleaning:  >>>Clean weekly, with Dawn soap, and bottle brush.  Set up to air dry.  Pneumovax 23 today  GERD (gastroesophageal reflux disease) Continue Protonix Avoid known triggers to GERD  Tobacco abuse, in remission Continue to not smoke Continue low-dose lung cancer screening in  July/2020 Consider pulmonary function test in the future    Lauraine Rinne, NP 11/12/2017

## 2017-11-12 ENCOUNTER — Ambulatory Visit (INDEPENDENT_AMBULATORY_CARE_PROVIDER_SITE_OTHER): Payer: 59 | Admitting: Pulmonary Disease

## 2017-11-12 ENCOUNTER — Encounter: Payer: Self-pay | Admitting: Pulmonary Disease

## 2017-11-12 VITALS — BP 130/64 | HR 62 | Ht 71.0 in | Wt 203.6 lb

## 2017-11-12 DIAGNOSIS — K219 Gastro-esophageal reflux disease without esophagitis: Secondary | ICD-10-CM

## 2017-11-12 DIAGNOSIS — G4733 Obstructive sleep apnea (adult) (pediatric): Secondary | ICD-10-CM

## 2017-11-12 DIAGNOSIS — Z23 Encounter for immunization: Secondary | ICD-10-CM | POA: Diagnosis not present

## 2017-11-12 DIAGNOSIS — F17201 Nicotine dependence, unspecified, in remission: Secondary | ICD-10-CM | POA: Diagnosis not present

## 2017-11-12 NOTE — Assessment & Plan Note (Signed)
Continue Protonix Avoid known triggers to GERD

## 2017-11-12 NOTE — Assessment & Plan Note (Addendum)
Continue to not smoke Continue low-dose lung cancer screening in July/2020 PFTs to be ordered

## 2017-11-12 NOTE — Patient Instructions (Addendum)
Keep follow up with PCP in Dec/2019  We recommend that you continue using your CPAP daily >>>Keep up the hard work using your device >>> Goal should be wearing this for the entire night that you are sleeping, at least 4 to 6 hours  Remember:  . Do not drive or operate heavy machinery if tired or drowsy.  . Please notify the supply company and office if you are unable to use your device regularly due to missing supplies or machine being broken.  . Work on maintaining a healthy weight and following your recommended nutrition plan  . Maintain proper daily exercise and movement  . Maintaining proper use of your device can also help improve management of other chronic illnesses such as: Blood pressure, blood sugars, and weight management.   BiPAP/ CPAP Cleaning:  >>>Clean weekly, with Dawn soap, and bottle brush.  Set up to air dry.   Pneumovax 23 today  Follow-up in 1 year >>>Follow-up sooner if you have increased shortness of breath, unable to do your daily activities as you typically are usually able to do >>>Follow-up sooner if you have repeated bronchitis-like episodes or have concerns regarding your breathing  November/2019 we will be moving! We will no longer be at our [place] location.  Be on the look out for a post card/mailer to let you know we have officially moved.  Our new address and phone number will be:  [address] [place], [place] [address] Telephone number: [phone]  It is flu season:   >>>Remember to be washing your hands regularly, using hand sanitizer, be careful to use around herself with has contact with people who are sick will increase her chances of getting sick yourself. >>> Best ways to protect herself from the flu: Receive the yearly flu vaccine, practice good hand hygiene washing with soap and also using hand sanitizer when available, eat a nutritious meals, get adequate rest, hydrate appropriately   Please contact the office if your symptoms  worsen or you have concerns that you are not improving.   Thank you for choosing [facility] for your healthcare, and for allowing [place] to partner with you on your healthcare journey. I am thankful to be able to provide care to you today.   Wyn Quaker FNP-C    Pneumococcal Vaccine, Polyvalent solution for injection What is this medicine? PNEUMOCOCCAL VACCINE, POLYVALENT (NEU mo KOK al vak SEEN, pol ee VEY luhnt) is a vaccine to prevent pneumococcus bacteria infection. These bacteria are a major cause of ear infections, Strep throat infections, and serious pneumonia, meningitis, or blood infections worldwide. These vaccines help the body to produce antibodies (protective substances) that help your body defend against these bacteria. This vaccine is recommended for people 43 years of age and older with health problems. It is also recommended for all adults over 41 years old. This vaccine will not treat an infection. This medicine may be used for other purposes; ask your health care provider or pharmacist if you have questions. COMMON BRAND NAME(S): Pneumovax 23 What should I tell my health care provider before I take this medicine? They need to know if you have any of these conditions: -bleeding problems -bone marrow or organ transplant -cancer, Hodgkin's disease -fever -infection -immune system problems -low platelet count in the blood -seizures -an unusual or allergic reaction to pneumococcal vaccine, diphtheria toxoid, other vaccines, latex, other medicines, foods, dyes, or preservatives -pregnant or trying to get pregnant -breast-feeding How should I use this medicine?  This vaccine is for injection into a muscle or under the skin. It is given by a health care professional. A copy of Vaccine Information Statements will be given before each vaccination. Read this sheet carefully each time. The sheet may change frequently. Talk to your pediatrician regarding the use of this  medicine in children. While this drug may be prescribed for children as young as 76 years of age for selected conditions, precautions do apply. Overdosage: If you think you have taken too much of this medicine contact a poison control center or emergency room at once. NOTE: This medicine is only for you. Do not share this medicine with others. What if I miss a dose? It is important not to miss your dose. Call your doctor or health care professional if you are unable to keep an appointment. What may interact with this medicine? -medicines for cancer chemotherapy -medicines that suppress your immune function -medicines that treat or prevent blood clots like warfarin, enoxaparin, and dalteparin -steroid medicines like prednisone or cortisone This list may not describe all possible interactions. Give your health care provider a list of all the medicines, herbs, non-prescription drugs, or dietary supplements you use. Also tell them if you smoke, drink alcohol, or use illegal drugs. Some items may interact with your medicine. What should I watch for while using this medicine? Mild fever and pain should go away in 3 days or less. Report any unusual symptoms to your doctor or health care professional. What side effects may I notice from receiving this medicine? Side effects that you should report to your doctor or health care professional as soon as possible: -allergic reactions like skin rash, itching or hives, swelling of the face, lips, or tongue -breathing problems -confused -fever over 102 degrees F -pain, tingling, numbness in the hands or feet -seizures -unusual bleeding or bruising -unusual muscle weakness Side effects that usually do not require medical attention (report to your doctor or health care professional if they continue or are bothersome): -aches and pains -diarrhea -fever of 102 degrees F or less -headache -irritable -loss of appetite -pain, tender at site where  injected -trouble sleeping This list may not describe all possible side effects. Call your doctor for medical advice about side effects. You may report side effects to FDA at 1-800-FDA-1088. Where should I keep my medicine? This does not apply. This vaccine is given in a clinic, pharmacy, doctor's office, or other health care setting and will not be stored at home. NOTE: This sheet is a summary. It may not cover all possible information. If you have questions about this medicine, talk to your doctor, pharmacist, or health care provider.  2018 Elsevier/Gold Standard (2007-08-02 14:32:37)

## 2017-11-12 NOTE — Assessment & Plan Note (Signed)
Keep follow up with PCP in Dec/2019  We recommend that you continue using your CPAP daily >>>Keep up the hard work using your device >>> Goal should be wearing this for the entire night that you are sleeping, at least 4 to 6 hours  Remember:  . Do not drive or operate heavy machinery if tired or drowsy.  . Please notify the supply company and office if you are unable to use your device regularly due to missing supplies or machine being broken.  . Work on maintaining a healthy weight and following your recommended nutrition plan  . Maintain proper daily exercise and movement  . Maintaining proper use of your device can also help improve management of other chronic illnesses such as: Blood pressure, blood sugars, and weight management.   BiPAP/ CPAP Cleaning:  >>>Clean weekly, with Dawn soap, and bottle brush.  Set up to air dry.  Pneumovax 23 today

## 2017-11-26 ENCOUNTER — Encounter: Payer: Self-pay | Admitting: Internal Medicine

## 2017-11-26 ENCOUNTER — Ambulatory Visit (INDEPENDENT_AMBULATORY_CARE_PROVIDER_SITE_OTHER): Payer: 59 | Admitting: Internal Medicine

## 2017-11-26 VITALS — BP 136/80 | HR 64 | Temp 98.3°F | Resp 16 | Ht 71.0 in | Wt 208.8 lb

## 2017-11-26 DIAGNOSIS — J069 Acute upper respiratory infection, unspecified: Secondary | ICD-10-CM

## 2017-11-26 HISTORY — DX: Acute upper respiratory infection, unspecified: J06.9

## 2017-11-26 MED ORDER — CEFDINIR 300 MG PO CAPS: 300.0000 mg | ORAL_CAPSULE | Freq: Two times a day (BID) | ORAL | 0 refills | Status: DC

## 2017-11-26 NOTE — Progress Notes (Signed)
Subjective:    Patient ID: [name], male    DOB: 04/10/1950, 67 y.o.   MRN: [id number]  HPI He is here for an acute visit for cold symptoms.  His symptoms started last week mid week.   He is experiencing chest tightness, productive cough of greenish sputum, shortness of breath and headaches.  He denies any fever, chills, ear pain, sore throat, wheezing, nausea, diarrhea, body aches and lightheadedness.  He has tried taking vick, nasal sprays, oral anti-histamine, decongestant  He is having difficulty using his cpap.     Medications and allergies reviewed with patient and updated if appropriate.  Patient Active Problem List   Diagnosis Date Noted  . Hallux rigidus of right foot 08/30/2017  . Hypertension 06/26/2017  . OSA (obstructive sleep apnea) 06/26/2017  . Tobacco abuse, in remission 06/26/2017  . Abnormal nuclear stress test 01/23/2017  . Type 2 diabetes mellitus with complication, without long-term current use of insulin ([place]) 06/22/2016  . Hypothyroidism 06/22/2016  . GERD (gastroesophageal reflux disease) 06/22/2016  . Status post cervical spinal fusion 05/23/2016  . Cervical spinal stenosis 04/05/2016  . Pain in right wrist 03/07/2016  . Neck pain 12/01/2015  . Other spondylosis with radiculopathy, cervical region 12/01/2015  . Diverticulitis of colon with perforation 10/20/2010    Current Outpatient Medications on File Prior to Visit  Medication Sig Dispense Refill  . Ascorbic Acid (VITAMIN C WITH ROSE HIPS) 500 MG tablet Take 1,000 mg by mouth daily.    Marland Kitchen aspirin EC 81 MG tablet Take 81 mg by mouth daily.    . B Complex-C (SUPER B COMPLEX PO) Take 1 tablet by mouth daily.    . Calcium Carbonate-Vitamin D (CALCIUM-VITAMIN D3 PO) Take 1 tablet by mouth every other day.    . Cholecalciferol (VITAMIN D3) 1000 units CAPS Take 1,000 Units by mouth daily.    . Garlic 3762 MG CAPS Take 1,000 mg by mouth every other day.    . levothyroxine (SYNTHROID,  LEVOTHROID) 100 MCG tablet Take 1 tablet (100 mcg total) by mouth daily before breakfast. 90 tablet 3  . losartan-hydrochlorothiazide (HYZAAR) 100-12.5 MG tablet Take 1 tablet by mouth daily after breakfast. 90 tablet 3  . Magnesium 100 MG TABS Take 100 mg by mouth every other day.    . metFORMIN (GLUCOPHAGE-XR) 500 MG 24 hr tablet Take 2 tablets (1,000 mg total) by mouth at bedtime. 180 tablet 3  . Multiple Vitamins-Minerals (MULTIVITAMIN ADULTS 50+ PO) Take 1 tablet by mouth daily.    . Omega-3 Fatty Acids (OMEGA 3 PO) Take 3 capsules by mouth daily.    . pantoprazole (PROTONIX) 20 MG tablet Take 1 tablet (20 mg total) by mouth daily after breakfast. 90 tablet 3  . vitamin E 400 UNIT capsule Take 400 Units by mouth every other day.     No current facility-administered medications on file prior to visit.     Past Medical History:  Diagnosis Date  . Arthritis   . Diabetes mellitus without complication ([place])   . Diverticulitis of colon   . GERD (gastroesophageal reflux disease)   . Headache   . Hypertension   . Hypothyroidism   . Thyroid disease     Past Surgical History:  Procedure Laterality Date  . ANKLE FRACTURE SURGERY    . ANTERIOR CERVICAL DECOMP/DISCECTOMY FUSION N/A 04/05/2016   Procedure: C5-6, C6-7 Anterior Cervical Discectomy and Fusion, Allograft, Plate;  Surgeon: Marybelle Killings, MD;  Location: [facility];  Service: Orthopedics;  Laterality: N/A;  . ANTERIOR FUSION CERVICAL SPINE  04/05/2016   c6 c7  . CARPAL TUNNEL RELEASE Bilateral   . 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 JOINT REPLACEMENT  11/10   right knee replaced   . KNEE SURGERY    . LEFT HEART CATH AND CORONARY ANGIOGRAPHY N/A 02/06/2017   Procedure: LEFT HEART CATH AND CORONARY ANGIOGRAPHY;  Surgeon: Belva Crome, MD;  Location: [facility];  Service: Cardiovascular;  Laterality: N/A;  . LEG SURGERY  11/03   broken lower right leg crushed     Social History   Socioeconomic History  .  Marital status: Divorced    Spouse name: Not on file  . Number of children: Not on file  . Years of education: Not on file  . Highest education level: Not on file  Occupational History  . Not on file  Social Needs  . Financial resource strain: Not on file  . Food insecurity:    Worry: Not on file    Inability: Not on file  . Transportation needs:    Medical: Not on file    Non-medical: Not on file  Tobacco Use  . Smoking status: Former Smoker    Types: Cigarettes    Last attempt to quit: 01/10/2008    Years since quitting: 9.8  . Smokeless tobacco: Former Systems developer    Types: Chew  Substance and Sexual Activity  . Alcohol use: Yes    Comment: occasionally  . Drug use: No  . Sexual activity: Not on file  Lifestyle  . Physical activity:    Days per week: Not on file    Minutes per session: Not on file  . Stress: Not on file  Relationships  . Social connections:    Talks on phone: Not on file    Gets together: Not on file    Attends religious service: Not on file    Active member of club or organization: Not on file    Attends meetings of clubs or organizations: Not on file    Relationship status: Not on file  Other Topics Concern  . Not on file  Social History Narrative  . Not on file    Family History  Problem Relation Age of Onset  . Hypertension Mother   . Diabetes Mother   . Heart attack Father   . Heart disease Father   . Diabetes Paternal Grandmother   . Cancer Paternal Grandfather        of eye    Review of Systems  Constitutional: Negative for chills and fever.  HENT: Positive for congestion and sinus pain (this morning). Negative for ear pain (ear pressure) and sore throat.   Respiratory: Positive for cough (productive - greenish sputum), chest tightness and shortness of breath. Negative for wheezing.   Gastrointestinal: Negative for diarrhea and nausea.  Musculoskeletal: Negative for myalgias.  Neurological: Positive for headaches. Negative for  light-headedness.       Objective:   Vitals:   11/26/17 1256  BP: 136/80  Resp: 16   Filed Weights   11/26/17 1256  Weight: 208 lb 12.8 oz (94.7 kg)   Body mass index is 29.12 kg/m.  Wt Readings from Last 3 Encounters:  11/26/17 208 lb 12.8 oz (94.7 kg)  11/12/17 203 lb 9.6 oz (92.4 kg)  10/25/17 195 lb (88.5 kg)     Physical Exam GENERAL APPEARANCE: Appears stated age, well appearing, NAD EYES:  conjunctiva clear, no icterus HEENT: bilateral tympanic membranes and ear canals normal, oropharynx with mild erythema, no thyromegaly, trachea midline, no cervical or supraclavicular lymphadenopathy LUNGS: Clear to auscultation without wheeze or crackles, unlabored breathing, good air entry bilaterally CARDIOVASCULAR: Normal S1,S2 without murmurs, no edema SKIN: warm, dry        Assessment & Plan:   See Problem List for Assessment and Plan of chronic medical problems.

## 2017-11-26 NOTE — Patient Instructions (Signed)
° ° °  Take the antibiotic as prescribed - complete the entire course.   ° ° °Continue over the counter cold medication, advil and tylenol.  Increase your fluids and rest.  ° ° °Call if no improvement   ° °

## 2017-11-26 NOTE — Assessment & Plan Note (Signed)
Concern for bacterial cause Start Omnicef twice daily x10 days Continue over-the-counter cold medications Increase rest and fluids Call if no improvement

## 2017-11-29 ENCOUNTER — Encounter (INDEPENDENT_AMBULATORY_CARE_PROVIDER_SITE_OTHER): Payer: Self-pay | Admitting: Orthopedic Surgery

## 2017-11-29 ENCOUNTER — Ambulatory Visit (INDEPENDENT_AMBULATORY_CARE_PROVIDER_SITE_OTHER): Payer: 59 | Admitting: Physician Assistant

## 2017-11-29 VITALS — Ht 71.0 in | Wt 208.8 lb

## 2017-11-29 DIAGNOSIS — M2021 Hallux rigidus, right foot: Secondary | ICD-10-CM

## 2017-11-30 ENCOUNTER — Encounter (INDEPENDENT_AMBULATORY_CARE_PROVIDER_SITE_OTHER): Payer: Self-pay | Admitting: Physician Assistant

## 2017-11-30 NOTE — Progress Notes (Signed)
Office Visit Note   Patient: [name]           Date of Birth: 30-Jan-1950           MRN: [id number] Visit Date: 11/29/2017              Requested by: Binnie Rail, MD [place], [place] [id number] PCP: Binnie Rail, MD  Chief Complaint  Patient presents with  . Right Foot - Follow-up      HPI: The patient is a 67 year old male who is seen for postoperative follow-up following right great toe MTP fusion On 08/21/2017.  He is very pleased with his results.  He has been wearing a stiff soled shoe and doing well ambulating with this.  He has minimal pain residually.  He is planning to return to work on December 2.  Assessment & Plan: Visit Diagnoses:  1. Hallux rigidus of right foot     Plan: Doing well following right great toe fusion. Okay for return to work 12/10/17 full duty. Follow up prn for questions or concerns.   Follow-Up Instructions: Return if symptoms worsen or fail to improve.   Ortho Exam  Patient is alert, oriented, no adenopathy, well-dressed, normal affect, normal respiratory effort. The right great toe is healing well and without signs of infection or cellulitis. Very minor edema.  Fusion is stable and there is no pain with palpation.  Imaging: No results found. No images are attached to the encounter.  Labs: Lab Results  Component Value Date   HGBA1C 7.0 (H) 06/27/2017   HGBA1C 6.8 (H) 12/27/2016   HGBA1C 6.6 (H) 06/22/2016   ESRSEDRATE 6 04/03/2017   LABURIC 7.5 04/03/2017   REPTSTATUS 09/16/2010 FINAL 09/15/2010   CULT NO GROWTH 09/15/2010     Lab Results  Component Value Date   ALBUMIN 4.4 06/27/2017   ALBUMIN 4.2 12/27/2016   ALBUMIN 4.3 06/22/2016   LABURIC 7.5 04/03/2017    Body mass index is 29.12 kg/m.  Orders:  No orders of the defined types were placed in this encounter.  No orders of the defined types were placed in this encounter.    Procedures: No procedures performed  Clinical Data: No  additional findings.  ROS:  All other systems negative, except as noted in the HPI. Review of Systems  Objective: Vital Signs: Ht 5\' 11"  (1.803 m)   Wt 208 lb 12.8 oz (94.7 kg)   BMI 29.12 kg/m   Specialty Comments:  No specialty comments available.  PMFS History: Patient Active Problem List   Diagnosis Date Noted  . Upper respiratory tract infection 11/26/2017  . Hallux rigidus of right foot 08/30/2017  . Hypertension 06/26/2017  . OSA (obstructive sleep apnea) 06/26/2017  . Tobacco abuse, in remission 06/26/2017  . Abnormal nuclear stress test 01/23/2017  . Type 2 diabetes mellitus with complication, without long-term current use of insulin ([place]) 06/22/2016  . Hypothyroidism 06/22/2016  . GERD (gastroesophageal reflux disease) 06/22/2016  . Status post cervical spinal fusion 05/23/2016  . Cervical spinal stenosis 04/05/2016  . Pain in right wrist 03/07/2016  . Neck pain 12/01/2015  . Other spondylosis with radiculopathy, cervical region 12/01/2015  . Diverticulitis of colon with perforation 10/20/2010   Past Medical History:  Diagnosis Date  . Arthritis   . Diabetes mellitus without complication ([place])   . Diverticulitis of colon   . GERD (gastroesophageal reflux disease)   . Headache   . Hypertension   . Hypothyroidism   .  Thyroid disease     Family History  Problem Relation Age of Onset  . Hypertension Mother   . Diabetes Mother   . Heart attack Father   . Heart disease Father   . Diabetes Paternal Grandmother   . Cancer Paternal Grandfather        of eye    Past Surgical History:  Procedure Laterality Date  . ANKLE FRACTURE SURGERY    . ANTERIOR CERVICAL DECOMP/DISCECTOMY FUSION N/A 04/05/2016   Procedure: C5-6, C6-7 Anterior Cervical Discectomy and Fusion, Allograft, Plate;  Surgeon: Marybelle Killings, MD;  Location: [facility];  Service: Orthopedics;  Laterality: N/A;  . ANTERIOR FUSION CERVICAL SPINE  04/05/2016   c6 c7  . CARPAL TUNNEL RELEASE Bilateral     . 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 JOINT REPLACEMENT  11/10   right knee replaced   . KNEE SURGERY    . LEFT HEART CATH AND CORONARY ANGIOGRAPHY N/A 02/06/2017   Procedure: LEFT HEART CATH AND CORONARY ANGIOGRAPHY;  Surgeon: Belva Crome, MD;  Location: [facility];  Service: Cardiovascular;  Laterality: N/A;  . LEG SURGERY  11/03   broken lower right leg crushed    Social History   Occupational History  . Not on file  Tobacco Use  . Smoking status: Former Smoker    Types: Cigarettes    Last attempt to quit: 01/10/2008    Years since quitting: 9.8  . Smokeless tobacco: Former Systems developer    Types: Chew  Substance and Sexual Activity  . Alcohol use: Yes    Comment: occasionally  . Drug use: No  . Sexual activity: Not on file

## 2017-12-26 NOTE — Patient Instructions (Addendum)
  Tests ordered today. Your results will be released to [facility] (or called to you) after review, usually within 72hours after test completion. If any changes need to be made, you will be notified at that same time.  shingrix immunization administered today.   Medications reviewed and updated.  Changes include :   Increase the pantoprazole to 40 mg daily  Your prescription(s) have been submitted to your pharmacy. Please take as directed and contact our office if you believe you are having problem(s) with the medication(s).    Please followup in 6 months

## 2017-12-26 NOTE — Progress Notes (Signed)
Subjective:    Patient ID: [name], male    DOB: 1950/12/02, 67 y.o.   MRN: [id number]  HPI The patient is here for follow up.  Diabetes: He is taking his medication daily as prescribed. He is compliant with a diabetic diet. He is not exercising regularly, due to foot surgery. He checks his feet daily and denies foot lesions. He is up-to-date with an ophthalmology examination.   Hypothyroidism:  He is taking his medication daily.  He denies any recent changes in energy or weight that are unexplained.   Hypertension: He is taking his medication daily. He is compliant with a low sodium diet.  He denies chest pain, palpitations, edema, shortness of breath and regular headaches. He is not exercising regularly.     GERD:  He is taking his medication daily as prescribed.  He is having frequent GERD symptoms.     Medications and allergies reviewed with patient and updated if appropriate.  Patient Active Problem List   Diagnosis Date Noted  . Hallux rigidus of right foot 08/30/2017  . Hypertension 06/26/2017  . OSA (obstructive sleep apnea) 06/26/2017  . Tobacco abuse, in remission 06/26/2017  . Abnormal nuclear stress test 01/23/2017  . Type 2 diabetes mellitus with complication, without long-term current use of insulin ([place]) 06/22/2016  . Hypothyroidism 06/22/2016  . GERD (gastroesophageal reflux disease) 06/22/2016  . Status post cervical spinal fusion 05/23/2016  . Cervical spinal stenosis 04/05/2016  . Pain in right wrist 03/07/2016  . Neck pain 12/01/2015  . Other spondylosis with radiculopathy, cervical region 12/01/2015  . Diverticulitis of colon with perforation 10/20/2010    Current Outpatient Medications on File Prior to Visit  Medication Sig Dispense Refill  . Ascorbic Acid (VITAMIN C WITH ROSE HIPS) 500 MG tablet Take 1,000 mg by mouth daily.    Marland Kitchen aspirin EC 81 MG tablet Take 81 mg by mouth daily.    . B Complex-C (SUPER B COMPLEX PO) Take 1 tablet by mouth  daily.    . Calcium Carbonate-Vitamin D (CALCIUM-VITAMIN D3 PO) Take 1 tablet by mouth every other day.    . Cholecalciferol (VITAMIN D3) 1000 units CAPS Take 1,000 Units by mouth daily.    . Garlic 9449 MG CAPS Take 1,000 mg by mouth every other day.    . Magnesium 100 MG TABS Take 100 mg by mouth every other day.    . Multiple Vitamins-Minerals (MULTIVITAMIN ADULTS 50+ PO) Take 1 tablet by mouth daily.    . Omega-3 Fatty Acids (OMEGA 3 PO) Take 3 capsules by mouth daily.    . vitamin E 400 UNIT capsule Take 400 Units by mouth every other day.     No current facility-administered medications on file prior to visit.     Past Medical History:  Diagnosis Date  . Arthritis   . Diabetes mellitus without complication ([place])   . Diverticulitis of colon   . GERD (gastroesophageal reflux disease)   . Headache   . Hypertension   . Hypothyroidism   . Thyroid disease     Past Surgical History:  Procedure Laterality Date  . ANKLE FRACTURE SURGERY    . ANTERIOR CERVICAL DECOMP/DISCECTOMY FUSION N/A 04/05/2016   Procedure: C5-6, C6-7 Anterior Cervical Discectomy and Fusion, Allograft, Plate;  Surgeon: Marybelle Killings, MD;  Location: [facility];  Service: Orthopedics;  Laterality: N/A;  . ANTERIOR FUSION CERVICAL SPINE  04/05/2016   c6 c7  . CARPAL TUNNEL RELEASE Bilateral   .  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 JOINT REPLACEMENT  11/10   right knee replaced   . KNEE SURGERY    . LEFT HEART CATH AND CORONARY ANGIOGRAPHY N/A 02/06/2017   Procedure: LEFT HEART CATH AND CORONARY ANGIOGRAPHY;  Surgeon: Belva Crome, MD;  Location: [facility];  Service: Cardiovascular;  Laterality: N/A;  . LEG SURGERY  11/03   broken lower right leg crushed     Social History   Socioeconomic History  . Marital status: Divorced    Spouse name: Not on file  . Number of children: Not on file  . Years of education: Not on file  . Highest education level: Not on file  Occupational History  .  Not on file  Social Needs  . Financial resource strain: Not on file  . Food insecurity:    Worry: Not on file    Inability: Not on file  . Transportation needs:    Medical: Not on file    Non-medical: Not on file  Tobacco Use  . Smoking status: Former Smoker    Types: Cigarettes    Last attempt to quit: 01/10/2008    Years since quitting: 9.9  . Smokeless tobacco: Former Systems developer    Types: Chew  Substance and Sexual Activity  . Alcohol use: Yes    Comment: occasionally  . Drug use: No  . Sexual activity: Not on file  Lifestyle  . Physical activity:    Days per week: Not on file    Minutes per session: Not on file  . Stress: Not on file  Relationships  . Social connections:    Talks on phone: Not on file    Gets together: Not on file    Attends religious service: Not on file    Active member of club or organization: Not on file    Attends meetings of clubs or organizations: Not on file    Relationship status: Not on file  Other Topics Concern  . Not on file  Social History Narrative  . Not on file    Family History  Problem Relation Age of Onset  . Hypertension Mother   . Diabetes Mother   . Heart attack Father   . Heart disease Father   . Diabetes Paternal Grandmother   . Cancer Paternal Grandfather        of eye    Review of Systems  Constitutional: Negative for chills and fever.  Respiratory: Negative for cough, shortness of breath and wheezing.   Cardiovascular: Negative for chest pain, palpitations and leg swelling.  Gastrointestinal: Negative for abdominal pain and nausea.  Neurological: Positive for dizziness (rare). Negative for light-headedness and headaches.       Objective:   Vitals:   12/27/17 0741  BP: 130/78  Pulse: (!) 57  Resp: 16  Temp: 98 F (36.7 C)  SpO2: 98%   BP Readings from Last 3 Encounters:  12/27/17 130/78  11/26/17 136/80  11/12/17 130/64   Wt Readings from Last 3 Encounters:  12/27/17 207 lb (93.9 kg)  11/29/17 208 lb  12.8 oz (94.7 kg)  11/26/17 208 lb 12.8 oz (94.7 kg)   Body mass index is 28.87 kg/m.   Physical Exam    Constitutional: Appears well-developed and well-nourished. No distress.  HENT:  Head: Normocephalic and atraumatic.  Neck: Neck supple. No tracheal deviation present. No thyromegaly present.  No cervical lymphadenopathy Cardiovascular: Normal rate, regular rhythm and normal heart sounds.  No murmur heard. No carotid bruit .  No edema Pulmonary/Chest: Effort normal and breath sounds normal. No respiratory distress. No has no wheezes. No rales.  Skin: Skin is warm and dry. Not diaphoretic.  Psychiatric: Normal mood and affect. Behavior is normal.      Assessment & Plan:    See Problem List for Assessment and Plan of chronic medical problems.

## 2017-12-27 ENCOUNTER — Other Ambulatory Visit (INDEPENDENT_AMBULATORY_CARE_PROVIDER_SITE_OTHER): Payer: 59

## 2017-12-27 ENCOUNTER — Encounter: Payer: Self-pay | Admitting: Internal Medicine

## 2017-12-27 ENCOUNTER — Ambulatory Visit (INDEPENDENT_AMBULATORY_CARE_PROVIDER_SITE_OTHER): Payer: 59 | Admitting: Internal Medicine

## 2017-12-27 VITALS — BP 130/78 | HR 57 | Temp 98.0°F | Resp 16 | Ht 71.0 in | Wt 207.0 lb

## 2017-12-27 DIAGNOSIS — E118 Type 2 diabetes mellitus with unspecified complications: Secondary | ICD-10-CM

## 2017-12-27 DIAGNOSIS — I10 Essential (primary) hypertension: Secondary | ICD-10-CM

## 2017-12-27 DIAGNOSIS — K219 Gastro-esophageal reflux disease without esophagitis: Secondary | ICD-10-CM

## 2017-12-27 DIAGNOSIS — E038 Other specified hypothyroidism: Secondary | ICD-10-CM

## 2017-12-27 DIAGNOSIS — Z23 Encounter for immunization: Secondary | ICD-10-CM | POA: Diagnosis not present

## 2017-12-27 LAB — COMPREHENSIVE METABOLIC PANEL
ALT: 19 U/L (ref 0–53)
AST: 23 U/L (ref 0–37)
Albumin: 4.5 g/dL (ref 3.5–5.2)
Alkaline Phosphatase: 68 U/L (ref 39–117)
BUN: 24 mg/dL — ABNORMAL HIGH (ref 6–23)
CO2: 27 mEq/L (ref 19–32)
Calcium: 9.8 mg/dL (ref 8.4–10.5)
Chloride: 103 mEq/L (ref 96–112)
Creatinine, Ser: 1.18 mg/dL (ref 0.40–1.50)
GFR: 65.82 mL/min (ref >60.00)
Glucose, Bld: 145 mg/dL — ABNORMAL HIGH (ref 70–99)
Potassium: 4.3 mEq/L (ref 3.5–5.1)
Sodium: 139 mEq/L (ref 135–145)
Total Bilirubin: 0.5 mg/dL (ref 0.2–1.2)
Total Protein: 7.7 g/dL (ref 6.0–8.3)

## 2017-12-27 LAB — LIPID PANEL
Cholesterol: 162 mg/dL (ref 0–200)
HDL: 42.4 mg/dL (ref >39.00)
LDL Cholesterol: 97 mg/dL (ref 0–99)
NonHDL: 119.15
TRIGLYCERIDES: 112 mg/dL (ref 0.0–149.0)
Total CHOL/HDL Ratio: 4
VLDL: 22.4 mg/dL (ref 0.0–40.0)

## 2017-12-27 LAB — HEMOGLOBIN A1C: Hgb A1c MFr Bld: 6.9 % — ABNORMAL HIGH (ref 4.6–6.5)

## 2017-12-27 LAB — TSH: TSH: 3.02 u[IU]/mL (ref 0.35–4.50)

## 2017-12-27 MED ORDER — LEVOTHYROXINE SODIUM 100 MCG PO TABS: 100.0000 ug | ORAL_TABLET | Freq: Every day | ORAL | 3 refills | Status: DC

## 2017-12-27 MED ORDER — PANTOPRAZOLE SODIUM 40 MG PO TBEC: 40.0000 mg | DELAYED_RELEASE_TABLET | Freq: Every day | ORAL | 3 refills | Status: DC

## 2017-12-27 MED ORDER — METFORMIN HCL ER 500 MG PO TB24: 1000.0000 mg | ORAL_TABLET | Freq: Every day | ORAL | 3 refills | Status: DC

## 2017-12-27 MED ORDER — LOSARTAN POTASSIUM-HCTZ 100-12.5 MG PO TABS: 1.0000 | ORAL_TABLET | Freq: Every day | ORAL | 3 refills | Status: DC

## 2017-12-27 NOTE — Assessment & Plan Note (Signed)
GERD not controlled Increase protonix to 40 mg daily

## 2017-12-27 NOTE — Assessment & Plan Note (Signed)
Clinically euthyroid Check tsh  Titrate med dose if needed  

## 2017-12-27 NOTE — Addendum Note (Signed)
Addended by: Delice Bison E on: 12/27/2017 09:32 AM   Modules accepted: Orders

## 2017-12-27 NOTE — Assessment & Plan Note (Signed)
BP well controlled Current regimen effective and well tolerated Continue current medications at current doses cmp  

## 2017-12-27 NOTE — Assessment & Plan Note (Signed)
Fairly compliant with a diabetic diet Active Check a1c

## 2018-03-28 ENCOUNTER — Other Ambulatory Visit: Payer: Self-pay

## 2018-03-28 ENCOUNTER — Ambulatory Visit (INDEPENDENT_AMBULATORY_CARE_PROVIDER_SITE_OTHER): Payer: 59

## 2018-03-28 DIAGNOSIS — Z23 Encounter for immunization: Secondary | ICD-10-CM | POA: Diagnosis not present

## 2018-05-28 ENCOUNTER — Encounter: Payer: Self-pay | Admitting: Orthopaedic Surgery

## 2018-05-28 ENCOUNTER — Other Ambulatory Visit: Payer: Self-pay

## 2018-05-28 ENCOUNTER — Ambulatory Visit (INDEPENDENT_AMBULATORY_CARE_PROVIDER_SITE_OTHER): Payer: Medicare Other

## 2018-05-28 ENCOUNTER — Ambulatory Visit (INDEPENDENT_AMBULATORY_CARE_PROVIDER_SITE_OTHER): Payer: Medicare Other | Admitting: Orthopaedic Surgery

## 2018-05-28 VITALS — Ht 70.0 in | Wt 192.0 lb

## 2018-05-28 DIAGNOSIS — M25532 Pain in left wrist: Secondary | ICD-10-CM | POA: Diagnosis not present

## 2018-05-28 NOTE — Progress Notes (Signed)
Office Visit Note   Patient: [name]           Date of Birth: [birth year]           MRN: [id number] Visit Date: 05/28/2018              Requested by: Binnie Rail, MD [place], [place] [address] PCP: Binnie Rail, MD   Assessment & Plan: Visit Diagnoses:  1. Pain in left wrist     Plan: With a polyarthritis bilateral knee replacements bilateral first MTP joint fusions we will recheck a uric acid to make sure he does not have hyperuricemia and has had some underlying gout destruction over the years.  I will call him with the results.  X-rays were reviewed with patient his wrist is negative for acute injury at this time.  Follow-Up Instructions: No follow-ups on file.   Orders:  Orders Placed This Encounter  Procedures  . XR Wrist Complete Left   No orders of the defined types were placed in this encounter.     Procedures: No procedures performed   Clinical Data: No additional findings.   Subjective: Chief Complaint  Patient presents with  . Left Wrist - Pain    DOI 05/24/2018    HPI 68 year old male returns he injured his left nondominant volar wrist when he was pulling on a lawnmower with both hands and the cord kicked back with a rubber T-shaped handle hitting the volar aspect of his wrist with some ecchymosis.  He still been able to flex and extend his fingers but has had pain soreness and has been wearing a Velcro splint.  X-rays obtained today are negative for acute fracture but he does have significant radiocarpal, ulnocarpal and first MTP fairly significant degenerative changes.  There is some calcification of the triangular fibrocartilage.  Negative for scaphoid distal radius or carpal fracture.  Patient has had bilateral feet first MTP joint fusions with plate by Dr. Sharol Given.  Total knee arthroplasties right and left.  Problems with degenerative changes in the right wrist.  Uric acid 04/03/2017 was 7.5.  The rest of arthritis panel was normal.  Review of Systems 14 point systems noncontributory other than as mentioned in HPI updated from 07/28/2016 office visit.   Objective: Vital Signs: Ht 5\' 10"  (1.778 m)   Wt 192 lb (87.1 kg)   BMI 27.55 kg/m   Physical Exam Constitutional:      Appearance: He is well-developed.  HENT:     Head: Normocephalic and atraumatic.  Eyes:     Pupils: Pupils are equal, round, and reactive to light.  Neck:     Thyroid: No thyromegaly.     Trachea: No tracheal deviation.  Cardiovascular:     Rate and Rhythm: Normal rate.  Pulmonary:     Effort: Pulmonary effort is normal.     Breath sounds: No wheezing.  Abdominal:     General: Bowel sounds are normal.     Palpations: Abdomen is soft.  Skin:    General: Skin is warm and dry.     Capillary Refill: Capillary refill takes less than 2 seconds.  Neurological:     Mental Status: He is alert and oriented to person, place, and time.  Psychiatric:        Behavior: Behavior normal.        Thought Content: Thought content normal.        Judgment: Judgment normal.     Ortho Exam well-healed  right left total knee arthroplasty incision.  There is ecchymosis over the volar wrist.  Palmaris longus FCR FCU finger flexors are all intact normal several my and per fundi.  Some tenderness over the first [facility] joint.  TFCC area is somewhat tender. Specialty Comments:  No specialty comments available.  Imaging: No results found.   PMFS History: Patient Active Problem List   Diagnosis Date Noted  . Hallux rigidus of right foot 08/30/2017  . Hypertension 06/26/2017  . OSA (obstructive sleep apnea) 06/26/2017  . Tobacco abuse, in remission 06/26/2017  . Abnormal nuclear stress test 01/23/2017  . Type 2 diabetes mellitus with complication, without long-term current use of insulin ([place]) 06/22/2016  . Hypothyroidism 06/22/2016  . GERD (gastroesophageal reflux disease) 06/22/2016  . Status post cervical spinal fusion 05/23/2016  . Cervical spinal  stenosis 04/05/2016  . Pain in right wrist 03/07/2016  . Neck pain 12/01/2015  . Other spondylosis with radiculopathy, cervical region 12/01/2015  . Diverticulitis of colon with perforation 10/20/2010   Past Medical History:  Diagnosis Date  . Arthritis   . Diabetes mellitus without complication ([place])   . Diverticulitis of colon   . GERD (gastroesophageal reflux disease)   . Headache   . Hypertension   . Hypothyroidism   . Thyroid disease     Family History  Problem Relation Age of Onset  . Hypertension Mother   . Diabetes Mother   . Heart attack Father   . Heart disease Father   . Diabetes Paternal Grandmother   . Cancer Paternal Grandfather        of eye    Past Surgical History:  Procedure Laterality Date  . ANKLE FRACTURE SURGERY    . ANTERIOR CERVICAL DECOMP/DISCECTOMY FUSION N/A 04/05/2016   Procedure: C5-6, C6-7 Anterior Cervical Discectomy and Fusion, Allograft, Plate;  Surgeon: Marybelle Killings, MD;  Location: [facility];  Service: Orthopedics;  Laterality: N/A;  . ANTERIOR FUSION CERVICAL SPINE  04/05/2016   c6 c7  . CARPAL TUNNEL RELEASE Bilateral   . 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 JOINT REPLACEMENT  11/10   right knee replaced   . KNEE SURGERY    . LEFT HEART CATH AND CORONARY ANGIOGRAPHY N/A 02/06/2017   Procedure: LEFT HEART CATH AND CORONARY ANGIOGRAPHY;  Surgeon: Belva Crome, MD;  Location: [facility];  Service: Cardiovascular;  Laterality: N/A;  . LEG SURGERY  11/03   broken lower right leg crushed    Social History   Occupational History  . Not on file  Tobacco Use  . Smoking status: Former Smoker    Types: Cigarettes    Last attempt to quit: 01/10/2008    Years since quitting: 10.3  . Smokeless tobacco: Former Systems developer    Types: Chew  Substance and Sexual Activity  . Alcohol use: Yes    Comment: occasionally  . Drug use: No  . Sexual activity: Not on file

## 2018-05-29 LAB — URIC ACID: Uric Acid, Serum: 8.7 mg/dL — ABNORMAL HIGH (ref 4.0–8.0)

## 2018-05-29 MED ORDER — ALLOPURINOL 100 MG PO TABS: ORAL_TABLET | ORAL | 0 refills | Status: DC

## 2018-05-29 NOTE — Progress Notes (Signed)
Allopurinol 100mg - 1 po qd x 1 week, then 2 po qd.  #60 with no refills sent to pharmacy per result message from Dr. Lorin Mercy.

## 2018-06-22 ENCOUNTER — Other Ambulatory Visit: Payer: Self-pay | Admitting: Orthopaedic Surgery

## 2018-06-24 NOTE — Telephone Encounter (Signed)
Please advise 

## 2018-06-27 NOTE — Progress Notes (Signed)
Subjective:    Patient ID: [name], male    DOB: October 20, 1950, 68 y.o.   MRN: [id number]  HPI The patient is here for follow up.  Diabetes: He is taking his medication daily as prescribed. He is compliant with a diabetic diet.   He checks his feet daily and denies foot lesions. He is up-to-date with an ophthalmology examination.   Hypothyroidism:  He is taking his medication daily.  He denies any recent changes in energy or weight that are unexplained.   Hypertension: He is taking his medication daily. He is compliant with a low sodium diet.  He denies chest pain, palpitations, edema, shortness of breath and regular headaches.      GERD:  He is taking his medication daily as prescribed.  He denies any GERD symptoms and feels his GERD is well controlled.     Medications and allergies reviewed with patient and updated if appropriate.  Patient Active Problem List   Diagnosis Date Noted  . Gout 06/28/2018  . Hallux rigidus of right foot 08/30/2017  . Hypertension 06/26/2017  . OSA (obstructive sleep apnea) 06/26/2017  . Tobacco abuse, in remission 06/26/2017  . Type 2 diabetes mellitus with complication, without long-term current use of insulin ([place]) 06/22/2016  . Hypothyroidism 06/22/2016  . GERD (gastroesophageal reflux disease) 06/22/2016  . Status post cervical spinal fusion 05/23/2016  . Cervical spinal stenosis 04/05/2016  . Pain in right wrist 03/07/2016  . Neck pain 12/01/2015  . Other spondylosis with radiculopathy, cervical region 12/01/2015  . Diverticulitis of colon with perforation 10/20/2010    Current Outpatient Medications on File Prior to Visit  Medication Sig Dispense Refill  . allopurinol (ZYLOPRIM) 100 MG tablet TAKE 1 TABLET ONCE DAILY X1 WEEK THEN TAKE 2 TABS DAILY. 60 tablet 0  . Ascorbic Acid (VITAMIN C WITH ROSE HIPS) 500 MG tablet Take 1,000 mg by mouth daily.    Marland Kitchen aspirin EC 81 MG tablet Take 81 mg by mouth daily.    . B Complex-C (SUPER B  COMPLEX PO) Take 1 tablet by mouth daily.    . Calcium Carbonate-Vitamin D (CALCIUM-VITAMIN D3 PO) Take 1 tablet by mouth every other day.    . Cholecalciferol (VITAMIN D3) 1000 units CAPS Take 1,000 Units by mouth daily.    . Garlic 4098 MG CAPS Take 1,000 mg by mouth every other day.    . levothyroxine (SYNTHROID, LEVOTHROID) 100 MCG tablet Take 1 tablet (100 mcg total) by mouth daily before breakfast. 90 tablet 3  . losartan-hydrochlorothiazide (HYZAAR) 100-12.5 MG tablet Take 1 tablet by mouth daily after breakfast. 90 tablet 3  . Magnesium 100 MG TABS Take 100 mg by mouth every other day.    . metFORMIN (GLUCOPHAGE-XR) 500 MG 24 hr tablet Take 2 tablets (1,000 mg total) by mouth at bedtime. 180 tablet 3  . Multiple Vitamins-Minerals (MULTIVITAMIN ADULTS 50+ PO) Take 1 tablet by mouth daily.    . Omega-3 Fatty Acids (OMEGA 3 PO) Take 3 capsules by mouth daily.    . pantoprazole (PROTONIX) 40 MG tablet Take 1 tablet (40 mg total) by mouth daily. 90 tablet 3  . vitamin E 400 UNIT capsule Take 400 Units by mouth every other day.     No current facility-administered medications on file prior to visit.     Past Medical History:  Diagnosis Date  . Arthritis   . Diabetes mellitus without complication ([place])   . Diverticulitis of colon   . GERD (  gastroesophageal reflux disease)   . Headache   . Hypertension   . Hypothyroidism   . Thyroid disease     Past Surgical History:  Procedure Laterality Date  . ANKLE FRACTURE SURGERY    . ANTERIOR CERVICAL DECOMP/DISCECTOMY FUSION N/A 04/05/2016   Procedure: C5-6, C6-7 Anterior Cervical Discectomy and Fusion, Allograft, Plate;  Surgeon: Marybelle Killings, MD;  Location: [facility];  Service: Orthopedics;  Laterality: N/A;  . ANTERIOR FUSION CERVICAL SPINE  04/05/2016   c6 c7  . CARPAL TUNNEL RELEASE Bilateral   . 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 JOINT REPLACEMENT  11/10   right knee replaced   . KNEE SURGERY    . LEFT HEART CATH AND  CORONARY ANGIOGRAPHY N/A 02/06/2017   Procedure: LEFT HEART CATH AND CORONARY ANGIOGRAPHY;  Surgeon: Belva Crome, MD;  Location: [facility];  Service: Cardiovascular;  Laterality: N/A;  . LEG SURGERY  11/03   broken lower right leg crushed     Social History   Socioeconomic History  . Marital status: Divorced    Spouse name: Not on file  . Number of children: Not on file  . Years of education: Not on file  . Highest education level: Not on file  Occupational History  . Not on file  Social Needs  . Financial resource strain: Not on file  . Food insecurity    Worry: Not on file    Inability: Not on file  . Transportation needs    Medical: Not on file    Non-medical: Not on file  Tobacco Use  . Smoking status: Former Smoker    Types: Cigarettes    Quit date: 01/10/2008    Years since quitting: 10.4  . Smokeless tobacco: Former Systems developer    Types: Chew  Substance and Sexual Activity  . Alcohol use: Yes    Comment: occasionally  . Drug use: No  . Sexual activity: Not on file  Lifestyle  . Physical activity    Days per week: Not on file    Minutes per session: Not on file  . Stress: Not on file  Relationships  . Social Herbalist on phone: Not on file    Gets together: Not on file    Attends religious service: Not on file    Active member of club or organization: Not on file    Attends meetings of clubs or organizations: Not on file    Relationship status: Not on file  Other Topics Concern  . Not on file  Social History Narrative  . Not on file    Family History  Problem Relation Age of Onset  . Hypertension Mother   . Diabetes Mother   . Heart attack Father   . Heart disease Father   . Diabetes Paternal Grandmother   . Cancer Paternal Grandfather        of eye    Review of Systems  Constitutional: Negative for chills and fever.  Respiratory: Positive for cough (occ with overexertion). Negative for shortness of breath and wheezing.    Cardiovascular: Negative for chest pain, palpitations and leg swelling.  Neurological: Positive for light-headedness (occ with mild dehydration or standing quickly). Negative for headaches.       Objective:   Vitals:   06/28/18 0749  BP: 130/80  Pulse: (!) 59  Temp: 98.2 F (36.8 C)  SpO2: 97%   BP Readings from Last 3 Encounters:  06/28/18 130/80  12/27/17 130/78  11/26/17 136/80   Wt Readings from Last 3 Encounters:  06/28/18 195 lb 8 oz (88.7 kg)  05/28/18 192 lb (87.1 kg)  12/27/17 207 lb (93.9 kg)   Body mass index is 28.05 kg/m.   Physical Exam    Constitutional: Appears well-developed and well-nourished. No distress.  HENT:  Head: Normocephalic and atraumatic.  Neck: Neck supple. No tracheal deviation present. No thyromegaly present.  No cervical lymphadenopathy Cardiovascular: Normal rate, regular rhythm and normal heart sounds.  No murmur heard. No carotid bruit .  No edema Pulmonary/Chest: Effort normal and breath sounds normal. No respiratory distress. No has no wheezes. No rales.  Skin: Skin is warm and dry. Not diaphoretic.  Psychiatric: Normal mood and affect. Behavior is normal.    Diabetic Foot Exam - Simple   Simple Foot Form Diabetic Foot exam was performed with the following findings: Yes 06/28/2018  8:02 AM  Visual Inspection No deformities, no ulcerations, no other skin breakdown bilaterally: Yes Sensation Testing Intact to touch and monofilament testing bilaterally: Yes Pulse Check Posterior Tibialis and Dorsalis pulse intact bilaterally: Yes Comments      Assessment & Plan:    See Problem List for Assessment and Plan of chronic medical problems.

## 2018-06-27 NOTE — Patient Instructions (Addendum)

## 2018-06-28 ENCOUNTER — Other Ambulatory Visit: Payer: Self-pay

## 2018-06-28 ENCOUNTER — Other Ambulatory Visit (INDEPENDENT_AMBULATORY_CARE_PROVIDER_SITE_OTHER): Payer: Medicare Other

## 2018-06-28 ENCOUNTER — Encounter: Payer: Self-pay | Admitting: Internal Medicine

## 2018-06-28 ENCOUNTER — Ambulatory Visit (INDEPENDENT_AMBULATORY_CARE_PROVIDER_SITE_OTHER): Payer: Medicare Other | Admitting: Internal Medicine

## 2018-06-28 VITALS — BP 130/80 | HR 59 | Temp 98.2°F | Ht 70.0 in | Wt 195.5 lb

## 2018-06-28 DIAGNOSIS — E118 Type 2 diabetes mellitus with unspecified complications: Secondary | ICD-10-CM

## 2018-06-28 DIAGNOSIS — M109 Gout, unspecified: Secondary | ICD-10-CM | POA: Insufficient documentation

## 2018-06-28 DIAGNOSIS — I10 Essential (primary) hypertension: Secondary | ICD-10-CM

## 2018-06-28 DIAGNOSIS — E038 Other specified hypothyroidism: Secondary | ICD-10-CM | POA: Diagnosis not present

## 2018-06-28 DIAGNOSIS — K219 Gastro-esophageal reflux disease without esophagitis: Secondary | ICD-10-CM | POA: Diagnosis not present

## 2018-06-28 LAB — LIPID PANEL
Cholesterol: 148 mg/dL (ref 0–200)
HDL: 42 mg/dL (ref >39.00)
LDL Cholesterol: 79 mg/dL (ref 0–99)
NonHDL: 106.28
Total CHOL/HDL Ratio: 4
Triglycerides: 136 mg/dL (ref 0.0–149.0)
VLDL: 27.2 mg/dL (ref 0.0–40.0)

## 2018-06-28 LAB — COMPREHENSIVE METABOLIC PANEL
ALT: 19 U/L (ref 0–53)
AST: 21 U/L (ref 0–37)
Albumin: 4.3 g/dL (ref 3.5–5.2)
Alkaline Phosphatase: 66 U/L (ref 39–117)
BUN: 25 mg/dL — ABNORMAL HIGH (ref 6–23)
CO2: 24 mEq/L (ref 19–32)
Calcium: 9.8 mg/dL (ref 8.4–10.5)
Chloride: 103 mEq/L (ref 96–112)
Creatinine, Ser: 1.15 mg/dL (ref 0.40–1.50)
GFR: 63.7 mL/min (ref >60.00)
Glucose, Bld: 108 mg/dL — ABNORMAL HIGH (ref 70–99)
Potassium: 4.1 mEq/L (ref 3.5–5.1)
Sodium: 138 mEq/L (ref 135–145)
Total Bilirubin: 0.5 mg/dL (ref 0.2–1.2)
Total Protein: 7 g/dL (ref 6.0–8.3)

## 2018-06-28 LAB — URIC ACID: Uric Acid, Serum: 7 mg/dL (ref 4.0–7.8)

## 2018-06-28 LAB — HEMOGLOBIN A1C: Hgb A1c MFr Bld: 7 % — ABNORMAL HIGH (ref 4.6–6.5)

## 2018-06-28 LAB — TSH: TSH: 3.74 u[IU]/mL (ref 0.35–4.50)

## 2018-06-28 NOTE — Assessment & Plan Note (Signed)
GERD controlled Continue daily medication  

## 2018-06-28 NOTE — Addendum Note (Signed)
Addended by: Aviva Signs M on: 06/28/2018 02:08 PM   Modules accepted: Orders

## 2018-06-28 NOTE — Assessment & Plan Note (Signed)
BP well controlled Current regimen effective and well tolerated Continue current medications at current doses cmp  

## 2018-06-28 NOTE — Assessment & Plan Note (Signed)
Saw ortho - elevated uric acid  Started on allopurinol Will recheck uric acid level

## 2018-06-28 NOTE — Assessment & Plan Note (Signed)
Clinically euthyroid Check tsh  Titrate med dose if needed  

## 2018-06-28 NOTE — Assessment & Plan Note (Signed)
Check a1c Low sugar / carb diet Stressed regular exercise   

## 2018-07-17 ENCOUNTER — Telehealth: Payer: Self-pay | Admitting: *Deleted

## 2018-07-17 NOTE — Telephone Encounter (Signed)

## 2018-07-18 ENCOUNTER — Other Ambulatory Visit: Payer: Self-pay

## 2018-07-18 ENCOUNTER — Ambulatory Visit (INDEPENDENT_AMBULATORY_CARE_PROVIDER_SITE_OTHER)
Admission: RE | Admit: 2018-07-18 | Discharge: 2018-07-18 | Disposition: A | Discharge status: Home / Self Care | Payer: Medicare Other | Source: Ambulatory Visit | Attending: Acute Care | Admitting: Acute Care

## 2018-07-18 DIAGNOSIS — Z87891 Personal history of nicotine dependence: Secondary | ICD-10-CM

## 2018-07-18 DIAGNOSIS — Z122 Encounter for screening for malignant neoplasm of respiratory organs: Secondary | ICD-10-CM

## 2018-07-19 ENCOUNTER — Other Ambulatory Visit: Payer: Self-pay | Admitting: *Deleted

## 2018-07-19 ENCOUNTER — Telehealth: Payer: Self-pay | Admitting: Internal Medicine

## 2018-07-19 DIAGNOSIS — Z87891 Personal history of nicotine dependence: Secondary | ICD-10-CM

## 2018-07-19 DIAGNOSIS — Z122 Encounter for screening for malignant neoplasm of respiratory organs: Secondary | ICD-10-CM

## 2018-07-19 NOTE — Telephone Encounter (Signed)
He recent Ct scan of his lungs show that he has plaque or cholesterol build up in his arteries.  His cholesterol is good but he should be on a cholesterol lowering medication.  I would like him to start atorvastatin 10 mg daily if he agrees.  This will help reduce his risk of future heart attacks and strokes

## 2018-07-22 MED ORDER — ATORVASTATIN CALCIUM 10 MG PO TABS: 10.0000 mg | ORAL_TABLET | Freq: Every day | ORAL | 0 refills | Status: DC

## 2018-07-22 NOTE — Telephone Encounter (Signed)
Pt aware of results Rx sent to pharmacy 

## 2018-08-07 ENCOUNTER — Other Ambulatory Visit: Payer: Self-pay

## 2018-08-07 MED ORDER — ALLOPURINOL 100 MG PO TABS: ORAL_TABLET | ORAL | 1 refills | Status: DC

## 2018-08-19 ENCOUNTER — Other Ambulatory Visit: Payer: Self-pay

## 2018-08-19 MED ORDER — ATORVASTATIN CALCIUM 10 MG PO TABS: 10.0000 mg | ORAL_TABLET | Freq: Every day | ORAL | 1 refills | Status: DC

## 2018-11-12 NOTE — Progress Notes (Signed)
@Patient  ID: [name], male    DOB: [birth year], 68 y.o.   MRN: [id number]  Chief Complaint  Patient presents with  . Follow-up    1 year follow up OSA. Patient stated he can not use his machine in over a month.     Referring provider: Binnie Rail, MD  HPI:  68 year old male former smoker followed in our office for obstructive sleep apnea.  PMH: Diabetes, hypertension, CAD Smoker/ Smoking History: Former smoker.  Quit 2011.  80-pack-year smoking history, lung RADS 1 July/2019. Maintenance:  None Pt of: Dr. Elsworth Soho   11/13/2018  - Visit   68 year old male former smoker followed in our office for moderate obstructive sleep apnea.  Patient presenting today for 1 year follow-up.  We do not have a compliance download as patient has not used over the last 3 months.  Patient reports that the issue has been his persistent sinus pressure and sinus rinses.   Patient reports that he is always had chronic issues with sinuses or allergic rhinitis but it has flared over this last year.  He is intolerant of wearing his nasal mask with the CPAP due to this.  2018 blood work shows peripheral eosinophilia.  Patient has started Flonase on his own accord as well as nasal saline rinses and is currently maintained on Xyzal.  Patient feels that humidity is worsening his sinus symptoms.  He does not have any discolored nasal sputum or fevers.  Patient has persistent nasal drainage and rhinorrhea.   Questionaires / Pulmonary Flowsheets:  Epworth:  Results of the Epworth flowsheet 06/26/2017  Sitting and reading 1  Watching TV 2  Sitting, inactive in a public place (e.g. a theatre or a meeting) 2  As a passenger in a car for an hour without a break 3  Lying down to rest in the afternoon when circumstances permit 3  Sitting and talking to someone 0  Sitting quietly after a lunch without alcohol 1  In a car, while stopped for a few minutes in traffic 0  Total score 12    Tests:    07/17/2017-Home sleep study- AHI 23.5 an hour, SaO2 low 77%  07/06/2017-CT chest lung cancer screening- lung RADS 1, annual screening CT without contrast in 12 months >>>Mild centrilobular emphysematous changes, upper lobe predominant mild subpleural reticulation/fibrosis and posterior right lower lobe  07/18/2018 - CT Chest Lung Cancer Screening - Lung RADs 1  FENO:  No results found for: NITRICOXIDE  PFT: No flowsheet data found.  WALK:  No flowsheet data found.  Imaging: No results found.  Lab Results:  CBC    Component Value Date/Time   WBC 6.1 04/03/2017 1100   RBC 4.67 04/03/2017 1100   HGB 13.8 04/03/2017 1100   HGB 12.8 (L) 01/30/2017 1600   HCT 40.5 04/03/2017 1100   HCT 37.6 01/30/2017 1600   PLT 307 04/03/2017 1100   PLT 315 01/30/2017 1600   MCV 86.7 04/03/2017 1100   MCV 86 01/30/2017 1600   MCH 29.6 04/03/2017 1100   MCHC 34.1 04/03/2017 1100   RDW 12.9 04/03/2017 1100   RDW 13.7 01/30/2017 1600   LYMPHSABS 1.8 12/27/2016 0854   MONOABS 0.5 12/27/2016 0854   EOSABS 0.5 12/27/2016 0854   BASOSABS 0.1 12/27/2016 0854    BMET    Component Value Date/Time   NA 138 06/28/2018 0810   NA 139 01/30/2017 1600   K 4.1 06/28/2018 0810   CL 103 06/28/2018 0810   CO2  24 06/28/2018 0810   GLUCOSE 108 (H) 06/28/2018 0810   BUN 25 (H) 06/28/2018 0810   BUN 25 01/30/2017 1600   CREATININE 1.15 06/28/2018 0810   CALCIUM 9.8 06/28/2018 0810   GFRNONAA 71 01/30/2017 1600   GFRAA 82 01/30/2017 1600    BNP No results found for: BNP  ProBNP No results found for: PROBNP  Specialty Problems      Pulmonary Problems   OSA (obstructive sleep apnea)    07/17/2017-Home sleep study- AHI 23.5 an hour, SaO2 low 77%      Allergic rhinitis    CBC with differential shows eosinophil count of 1000 2018         Allergies  Allergen Reactions  . Morphine Other (See Comments)    Severe headache    Immunization History  Administered Date(s) Administered  .  Influenza, High Dose Seasonal PF 10/12/2017  . Pneumococcal Polysaccharide-23 11/12/2017  . Tdap 06/27/2017  . Zoster Recombinat (Shingrix) 12/27/2017, 03/28/2018    Past Medical History:  Diagnosis Date  . Arthritis   . Diabetes mellitus without complication ([place])   . Diverticulitis of colon   . GERD (gastroesophageal reflux disease)   . Headache   . Hypertension   . Hypothyroidism   . Thyroid disease     Tobacco History: Social History   Tobacco Use  Smoking Status Former Smoker  . Types: Cigarettes  . Quit date: 01/10/2008  . Years since quitting: 10.8  Smokeless Tobacco Former Systems developer  . Types: Chew   Counseling given: Yes  Continue to not smoke  Outpatient Encounter Medications as of 11/13/2018  Medication Sig  . allopurinol (ZYLOPRIM) 100 MG tablet Take 1 tablet by mouth 2 times daily  . Ascorbic Acid (VITAMIN C WITH ROSE HIPS) 500 MG tablet Take 1,000 mg by mouth daily.  Marland Kitchen aspirin EC 81 MG tablet Take 81 mg by mouth daily.  Marland Kitchen atorvastatin (LIPITOR) 10 MG tablet Take 1 tablet (10 mg total) by mouth daily.  . B Complex-C (SUPER B COMPLEX PO) Take 1 tablet by mouth daily.  . Calcium Carbonate-Vitamin D (CALCIUM-VITAMIN D3 PO) Take 1 tablet by mouth every other day.  . Cholecalciferol (VITAMIN D3) 1000 units CAPS Take 1,000 Units by mouth daily.  . Garlic 123XX123 MG CAPS Take 1,000 mg by mouth every other day.  . levothyroxine (SYNTHROID, LEVOTHROID) 100 MCG tablet Take 1 tablet (100 mcg total) by mouth daily before breakfast.  . losartan-hydrochlorothiazide (HYZAAR) 100-12.5 MG tablet Take 1 tablet by mouth daily after breakfast.  . Magnesium 100 MG TABS Take 100 mg by mouth every other day.  . metFORMIN (GLUCOPHAGE-XR) 500 MG 24 hr tablet Take 2 tablets (1,000 mg total) by mouth at bedtime.  . Multiple Vitamins-Minerals (MULTIVITAMIN ADULTS 50+ PO) Take 1 tablet by mouth daily.  . Omega-3 Fatty Acids (OMEGA 3 PO) Take 3 capsules by mouth daily.  . pantoprazole (PROTONIX)  40 MG tablet Take 1 tablet (40 mg total) by mouth daily.  . vitamin E 400 UNIT capsule Take 400 Units by mouth every other day.  . predniSONE (DELTASONE) 10 MG tablet 4 tabs for 2 days, then 3 tabs for 2 days, 2 tabs for 2 days, then 1 tab for 2 days, then stop   No facility-administered encounter medications on file as of 11/13/2018.      Review of Systems  Review of Systems  Constitutional: Negative for activity change, chills, fatigue, fever and unexpected weight change.  HENT: Positive for congestion, postnasal drip,  rhinorrhea and sinus pressure. Negative for sinus pain and sore throat.   Eyes: Negative.   Respiratory: Negative for cough, shortness of breath and wheezing.   Cardiovascular: Negative for chest pain and palpitations.  Gastrointestinal: Negative for constipation, diarrhea, nausea and vomiting.  Endocrine: Negative.   Genitourinary: Negative.   Musculoskeletal: Negative.   Skin: Negative.   Neurological: Positive for headaches. Negative for dizziness.  Psychiatric/Behavioral: Negative.  Negative for dysphoric mood. The patient is not nervous/anxious.   All other systems reviewed and are negative.    Physical Exam  BP 110/68 (BP Location: Left Arm, Patient Position: Sitting, Cuff Size: Normal)   Pulse 61   Temp 98 F (36.7 C) (Temporal)   Ht 5\' 9"  (1.753 m)   Wt 200 lb 3.2 oz (90.8 kg)   SpO2 96%   BMI 29.56 kg/m   Wt Readings from Last 5 Encounters:  11/13/18 200 lb 3.2 oz (90.8 kg)  06/28/18 195 lb 8 oz (88.7 kg)  05/28/18 192 lb (87.1 kg)  12/27/17 207 lb (93.9 kg)  11/29/17 208 lb 12.8 oz (94.7 kg)    BMI Readings from Last 5 Encounters:  11/13/18 29.56 kg/m  06/28/18 28.05 kg/m  05/28/18 27.55 kg/m  12/27/17 28.87 kg/m  11/29/17 29.12 kg/m     Physical Exam Vitals signs and nursing note reviewed.  Constitutional:      General: He is not in acute distress.    Appearance: Normal appearance. He is normal weight.  HENT:     Head:  Normocephalic and atraumatic.     Right Ear: Hearing, tympanic membrane, ear canal and external ear normal. There is no impacted cerumen.     Left Ear: Hearing, tympanic membrane, ear canal and external ear normal. There is no impacted cerumen.     Nose: Congestion and rhinorrhea present. No mucosal edema.     Right Turbinates: Not enlarged.     Left Turbinates: Not enlarged.     Mouth/Throat:     Mouth: Mucous membranes are dry.     Pharynx: Oropharynx is clear. No oropharyngeal exudate.  Eyes:     Pupils: Pupils are equal, round, and reactive to light.  Neck:     Musculoskeletal: Normal range of motion.  Cardiovascular:     Rate and Rhythm: Normal rate and regular rhythm.     Pulses: Normal pulses.     Heart sounds: Normal heart sounds. No murmur.  Pulmonary:     Effort: Pulmonary effort is normal.     Breath sounds: Normal breath sounds. No decreased breath sounds, wheezing or rales.  Musculoskeletal:     Right lower leg: No edema.     Left lower leg: No edema.  Lymphadenopathy:     Cervical: No cervical adenopathy.  Skin:    General: Skin is warm and dry.     Capillary Refill: Capillary refill takes less than 2 seconds.     Findings: No erythema or rash.  Neurological:     General: No focal deficit present.     Mental Status: He is alert and oriented to person, place, and time.     Motor: No weakness.     Coordination: Coordination normal.     Gait: Gait is intact. Gait normal.  Psychiatric:        Mood and Affect: Mood normal.        Behavior: Behavior normal. Behavior is cooperative.        Thought Content: Thought content normal.  Judgment: Judgment normal.       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 Elsworth Soho.   Lauraine Rinne, NP 11/13/2018   This appointment was 26 minutes long with over 50% of the time in direct face-to-face patient care, assessment, plan of care, and follow-up.

## 2018-11-13 ENCOUNTER — Other Ambulatory Visit: Payer: Self-pay

## 2018-11-13 ENCOUNTER — Ambulatory Visit (INDEPENDENT_AMBULATORY_CARE_PROVIDER_SITE_OTHER): Payer: Medicare Other | Admitting: Pulmonary Disease

## 2018-11-13 ENCOUNTER — Encounter: Payer: Self-pay | Admitting: Pulmonary Disease

## 2018-11-13 VITALS — BP 110/68 | HR 61 | Temp 98.0°F | Ht 69.0 in | Wt 200.2 lb

## 2018-11-13 DIAGNOSIS — J309 Allergic rhinitis, unspecified: Secondary | ICD-10-CM | POA: Diagnosis not present

## 2018-11-13 DIAGNOSIS — J3089 Other allergic rhinitis: Secondary | ICD-10-CM | POA: Insufficient documentation

## 2018-11-13 DIAGNOSIS — G4733 Obstructive sleep apnea (adult) (pediatric): Secondary | ICD-10-CM | POA: Diagnosis not present

## 2018-11-13 DIAGNOSIS — F17201 Nicotine dependence, unspecified, in remission: Secondary | ICD-10-CM

## 2018-11-13 DIAGNOSIS — D7219 Other eosinophilia: Secondary | ICD-10-CM

## 2018-11-13 LAB — CBC WITH DIFFERENTIAL/PLATELET
Basophils Absolute: 0.1 10*3/uL (ref 0.0–0.1)
Basophils Relative: 1.5 % (ref 0.0–3.0)
Eosinophils Absolute: 0.9 10*3/uL — ABNORMAL HIGH (ref 0.0–0.7)
Eosinophils Relative: 14.5 % — ABNORMAL HIGH (ref 0.0–5.0)
HCT: 40 % (ref 39.0–52.0)
Hemoglobin: 13.1 g/dL (ref 13.0–17.0)
Lymphocytes Relative: 29.9 % (ref 12.0–46.0)
Lymphs Abs: 1.8 10*3/uL (ref 0.7–4.0)
MCHC: 32.9 g/dL (ref 30.0–36.0)
MCV: 90.9 fl (ref 78.0–100.0)
Monocytes Absolute: 0.5 10*3/uL (ref 0.1–1.0)
Monocytes Relative: 8.3 % (ref 3.0–12.0)
Neutro Abs: 2.7 10*3/uL (ref 1.4–7.7)
Neutrophils Relative %: 45.8 % (ref 43.0–77.0)
Platelets: 251 10*3/uL (ref 150.0–400.0)
RBC: 4.4 Mil/uL (ref 4.22–5.81)
RDW: 13.4 % (ref 11.5–15.5)
WBC: 5.9 10*3/uL (ref 4.0–10.5)

## 2018-11-13 MED ORDER — PREDNISONE 10 MG PO TABS: ORAL_TABLET | ORAL | 0 refills | Status: DC

## 2018-11-13 NOTE — Addendum Note (Signed)
Addended by: Suzzanne Cloud E on: 11/13/2018 09:45 AM   Modules accepted: Orders

## 2018-11-13 NOTE — Assessment & Plan Note (Signed)
Plan: Resume CPAP use We will place an order for your DME company to reduce your humidity Follow-up with our office in 6 to 8 weeks

## 2018-11-13 NOTE — Patient Instructions (Addendum)
You were seen today by Lauraine Rinne, NP  for:   1. OSA (obstructive sleep apnea)  We recommend that you continue using your CPAP daily >>>Keep up the hard work using your device >>> Goal should be wearing this for the entire night that you are sleeping, at least 4 to 6 hours  Remember:  . Do not drive or operate heavy machinery if tired or drowsy.  . Please notify the supply company and office if you are unable to use your device regularly due to missing supplies or machine being broken.  . Work on maintaining a healthy weight and following your recommended nutrition plan  . Maintain proper daily exercise and movement  . Maintaining proper use of your device can also help improve management of other chronic illnesses such as: Blood pressure, blood sugars, and weight management.   BiPAP/ CPAP Cleaning:  >>>Clean weekly, with Dawn soap, and bottle brush.  Set up to air dry.    2. Tobacco abuse, in remission  3. Allergic rhinitis, unspecified seasonality, unspecified trigger  - predniSONE (DELTASONE) 10 MG tablet; 4 tabs for 2 days, then 3 tabs for 2 days, 2 tabs for 2 days, then 1 tab for 2 days, then stop  Dispense: 20 tablet; Refill: 0 - CBC with Differential/Platelet; Future - IgE; Future  Increase Flonase 1 spray each nostril twice daily  Nasal saline rinses 2 times daily  Continue Xyzal  4. Eosinophilic leukocytosis, unspecified type  - CBC with Differential/Platelet; Future - IgE; Future   We recommend today:  Orders Placed This Encounter  Procedures  . CBC with Differential/Platelet    Standing Status:   Future    Standing Expiration Date:   11/13/2019  . IgE    Standing Status:   Future    Standing Expiration Date:   11/13/2019   Orders Placed This Encounter  Procedures  . CBC with Differential/Platelet  . IgE   Meds ordered this encounter  Medications  . predniSONE (DELTASONE) 10 MG tablet    Sig: 4 tabs for 2 days, then 3 tabs for 2 days, 2 tabs for 2  days, then 1 tab for 2 days, then stop    Dispense:  20 tablet    Refill:  0    Follow Up:    Return in about 2 months (around 01/13/2019), or if symptoms worsen or fail to improve, for Follow up with Wyn Quaker FNP-C, Follow up with Dr. Elsworth Soho.   Please do your part to reduce the spread of COVID-19:      Reduce your risk of any infection  and COVID19 by using the similar precautions used for avoiding the common cold or flu:  Marland Kitchen Wash your hands often with soap and warm water for at least 20 seconds.  If soap and water are not readily available, use an alcohol-based hand sanitizer with at least 60% alcohol.  . If coughing or sneezing, cover your mouth and nose by coughing or sneezing into the elbow areas of your shirt or coat, into a tissue or into your sleeve (not your hands). Langley Gauss A MASK when in public  . Avoid shaking hands with others and consider head nods or verbal greetings only. . Avoid touching your eyes, nose, or mouth with unwashed hands.  . Avoid close contact with people who are sick. . Avoid places or events with large numbers of people in one location, like concerts or sporting events. . If you have some symptoms but not all  symptoms, continue to monitor at home and seek medical attention if your symptoms worsen. . If you are having a medical emergency, call 911.   [place] / e-Visit: [email or web address]         [facility]: [facility]: [id number]                   [facility]: [id number]     It is flu season:   >>> Best ways to protect herself from the flu: Receive the yearly flu vaccine, practice good hand hygiene washing with soap and also using hand sanitizer when available, eat a nutritious meals, get adequate rest, hydrate appropriately   Please contact the office if your symptoms worsen or you have  concerns that you are not improving.   Thank you for choosing [facility] for your healthcare, and for allowing [place] to partner with you on your healthcare journey. I am thankful to be able to provide care to you today.   Wyn Quaker FNP-C

## 2018-11-13 NOTE — Assessment & Plan Note (Signed)
Plan: Continue Xyzal Increase Flonase to 1 spray each nostril twice daily Nasal saline rinses twice daily Lab work today May need to consider referral to an allergist

## 2018-11-13 NOTE — Assessment & Plan Note (Signed)
Plan: Continue July/2021 CT chest in our lung cancer screening program

## 2018-11-13 NOTE — Assessment & Plan Note (Signed)
Plan: Lab work today May need to consider allergy referral in the future

## 2018-11-14 LAB — IGE: IgE (Immunoglobulin E), Serum: 21 kU/L (ref <=114)

## 2018-11-14 NOTE — Progress Notes (Signed)
IgE lab test normal.  Blood work still showing peripheral eosinophilia.   I would recommend that we get you evaluated by an allergist.  If you agree we can place a referral.  I would recommend a referral to either Dr. Neldon Mc or Dr. Ernst Bowler.  Wyn Quaker FNP

## 2018-11-20 ENCOUNTER — Telehealth: Payer: Self-pay | Admitting: Pulmonary Disease

## 2018-11-20 DIAGNOSIS — D721 Eosinophilia, unspecified: Secondary | ICD-10-CM

## 2018-11-20 NOTE — Telephone Encounter (Signed)
Call returned to patient, confirmed DOB, made aware of results per BM:  Notes recorded by Lauraine Rinne, NP on 11/14/2018 at 3:07 PM EST  IgE lab test normal. Blood work still showing peripheral eosinophilia.   I would recommend that we get you evaluated by an allergist. If you agree we can place a referral. I would recommend a referral to either Dr. Neldon Mc or Dr. Ernst Bowler.   Wyn Quaker FNP  Voiced understanding. Pt okay with allergy referral. Referral placed. Nothing further needed at this time.

## 2018-12-16 ENCOUNTER — Encounter: Payer: Self-pay | Admitting: Allergy and Immunology

## 2018-12-16 ENCOUNTER — Encounter: Payer: Medicare Other | Admitting: Allergy and Immunology

## 2018-12-16 ENCOUNTER — Other Ambulatory Visit: Payer: Self-pay

## 2018-12-16 NOTE — Progress Notes (Signed)
This encounter was created in error - please disregard.

## 2018-12-17 ENCOUNTER — Other Ambulatory Visit: Payer: Self-pay

## 2018-12-17 ENCOUNTER — Encounter: Payer: Self-pay | Admitting: Allergy and Immunology

## 2018-12-17 ENCOUNTER — Other Ambulatory Visit: Payer: Self-pay | Admitting: Allergy and Immunology

## 2018-12-17 ENCOUNTER — Ambulatory Visit (INDEPENDENT_AMBULATORY_CARE_PROVIDER_SITE_OTHER): Payer: Medicare Other | Admitting: Allergy and Immunology

## 2018-12-17 VITALS — BP 132/68 | HR 64 | Temp 96.6°F | Resp 18 | Ht 71.0 in | Wt 198.1 lb

## 2018-12-17 DIAGNOSIS — R05 Cough: Secondary | ICD-10-CM | POA: Diagnosis not present

## 2018-12-17 DIAGNOSIS — R059 Cough, unspecified: Secondary | ICD-10-CM

## 2018-12-17 DIAGNOSIS — J3089 Other allergic rhinitis: Secondary | ICD-10-CM

## 2018-12-17 DIAGNOSIS — J321 Chronic frontal sinusitis: Secondary | ICD-10-CM | POA: Diagnosis not present

## 2018-12-17 DIAGNOSIS — H1013 Acute atopic conjunctivitis, bilateral: Secondary | ICD-10-CM | POA: Diagnosis not present

## 2018-12-17 DIAGNOSIS — H1010 Acute atopic conjunctivitis, unspecified eye: Secondary | ICD-10-CM | POA: Insufficient documentation

## 2018-12-17 MED ORDER — FLUTICASONE PROPIONATE 50 MCG/ACT NA SUSP: 2.0000 | Freq: Every day | NASAL | 5 refills | Status: DC

## 2018-12-17 MED ORDER — AZELASTINE HCL 0.15 % NA SOLN: 2.0000 | Freq: Two times a day (BID) | NASAL | 5 refills | Status: DC

## 2018-12-17 MED ORDER — AZELASTINE-FLUTICASONE 137-50 MCG/ACT NA SUSP: 1.0000 | Freq: Two times a day (BID) | NASAL | 2 refills | Status: DC | PRN

## 2018-12-17 NOTE — Progress Notes (Signed)
New Patient Note  RE: [name] MRN: [id number] DOB: 1950/01/17 Date of Office Visit: 12/17/2018  Referring provider: Binnie Rail, MD Primary care provider: Binnie Rail, MD  Chief Complaint: Sinus Problem and Nasal Congestion  History of present illness: [name] is a 68 y.o. male seen today in consultation requested by Jerry Gosling, MD.  Over the past 5 or 6 years, [name] has experienced nasal congestion, sinus pressure, rhinorrhea, nasal pruritus, and ocular pruritus.  He experiences thick postnasal drainage, throat clearing, and a cough with clear mucus production.  These symptoms occur year-round and are specifically triggered by cat exposure.  The symptoms seem to be worse during times of season change, particularly in the springtime.  He reports that in the past he had been able to control the symptoms with over-the-counter antihistamines.  However, in September 2020 he was started on CPAP.  His nasal/sinus symptoms began to progress after approximately 1 month of CPAP.  He has attempted to adjust the CPAP humidifier without benefit.  He stopped using CPAP for approximately 1 week and his "head cleared up."  The symptoms returned when he started back on CPAP.  Taking levocetirizine twice daily and Nasacort AQ twice daily, along with nasal saline spray once or twice during the day has provided significant symptom relief, however he would like to minimize medication use. He has no history of symptoms consistent with asthma, eczema, or food allergies.  Assessment and plan: Perennial allergic rhinitis with possible nonallergic component Aeroallergens skin tests revealed reactivity to molds, dog epithelia, and cat hair.  Given the significant symptom increase with the use of CPAP, this may represent allergic rhinosinusitis secondary to mold exposure in the CPAP device, and/or vasomotor rhinosinusitis secondary to the CPAP pressure and/your humidity levels.  I have  recommended a thorough cleaning of the CPAP machine to ensure there is no mold present and then monitoring symptoms.  Aeroallergen avoidance measures have been discussed and provided in written form.  A prescription has been provided for azelastine/fluticasone nasal spray, 1 spray per nostril twice daily as needed. Proper nasal spray technique has been discussed and demonstrated.  Nasal saline lavage (NeilMed) has been recommended as needed and prior to medicated nasal sprays along with instructions for proper administration.  For thick post nasal drainage, add guaifenesin 1200 mg (Mucinex Maximum Strength)  twice daily as needed with adequate hydration as discussed.  Cough The patient's history and physical examination suggest upper airway cough syndrome.  We will aggressively treat postnasal drainage and evaluate results.  Treatment plan as outlined above.  If the coughing persists or progresses despite this plan, we will evaluate further.  Allergic conjunctivitis  Treatment plan as outlined above for allergic rhinitis.  A prescription has been provided for Pataday, one drop per eye daily as needed.   Meds ordered this encounter  Medications  . Azelastine-Fluticasone 137-50 MCG/ACT SUSP    Sig: Place 1 spray into the nose 2 (two) times daily as needed.    Dispense:  23 g    Refill:  2    Diagnostics: Epicutaneous testing: Negative despite a positive histamine control. Intradermal testing: Reactivity to major mold mix #1, #2, #3, #4, cat hair, and dog epithelia.  Physical examination: Blood pressure 132/68, pulse 64, temperature (!) 96.6 F (35.9 C), temperature source Temporal, resp. rate 18, height 5\' 11"  (1.803 m), weight 198 lb 1.9 oz (89.9 kg), SpO2 95 %.  General: Alert, interactive, in no acute distress. HEENT: TMs  pearly gray, turbinates moderately edematous without discharge, post-pharynx moderately erythematous. Neck: Supple without lymphadenopathy. Lungs: Clear  to auscultation without wheezing, rhonchi or rales. CV: Normal S1, S2 without murmurs. Abdomen: Nondistended, nontender. Skin: Warm and dry, without lesions or rashes. Extremities:  No clubbing, cyanosis or edema. Neuro:   Grossly intact.  Review of systems:  Review of systems negative except as noted in HPI / PMHx or noted below: Review of Systems  Constitutional: Negative.   HENT: Negative.   Eyes: Negative.   Respiratory: Negative.   Cardiovascular: Negative.   Gastrointestinal: Negative.   Genitourinary: Negative.   Musculoskeletal: Negative.   Skin: Negative.   Neurological: Negative.   Endo/Heme/Allergies: Negative.   Psychiatric/Behavioral: Negative.     Past medical history:  Past Medical History:  Diagnosis Date  . Arthritis   . Diabetes mellitus without complication ([place])   . Diverticulitis of colon   . GERD (gastroesophageal reflux disease)   . Headache   . Hypertension   . Hypothyroidism   . Thyroid disease     Past surgical history:  Past Surgical History:  Procedure Laterality Date  . ANKLE FRACTURE SURGERY    . ANTERIOR CERVICAL DECOMP/DISCECTOMY FUSION N/A 04/05/2016   Procedure: C5-6, C6-7 Anterior Cervical Discectomy and Fusion, Allograft, Plate;  Surgeon: Marybelle Killings, MD;  Location: [facility];  Service: Orthopedics;  Laterality: N/A;  . ANTERIOR FUSION CERVICAL SPINE  04/05/2016   c6 c7  . CARPAL TUNNEL RELEASE Bilateral   . 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 JOINT REPLACEMENT  11/10   right knee replaced   . KNEE SURGERY    . LEFT HEART CATH AND CORONARY ANGIOGRAPHY N/A 02/06/2017   Procedure: LEFT HEART CATH AND CORONARY ANGIOGRAPHY;  Surgeon: Belva Crome, MD;  Location: [facility];  Service: Cardiovascular;  Laterality: N/A;  . LEG SURGERY  11/03   broken lower right leg crushed     Family history: Family History  Problem Relation Age of Onset  . Hypertension Mother   . Diabetes Mother   . Heart attack Father    . Heart disease Father   . Diabetes Paternal Grandmother   . Cancer Paternal Grandfather        of eye    Social history: Social History   Socioeconomic History  . Marital status: Divorced    Spouse name: Not on file  . Number of children: Not on file  . Years of education: Not on file  . Highest education level: Not on file  Occupational History  . Not on file  Social Needs  . Financial resource strain: Not on file  . Food insecurity    Worry: Not on file    Inability: Not on file  . Transportation needs    Medical: Not on file    Non-medical: Not on file  Tobacco Use  . Smoking status: Former Smoker    Types: Cigarettes    Quit date: 01/10/2008    Years since quitting: 10.9  . Smokeless tobacco: Former Systems developer    Types: Chew  Substance and Sexual Activity  . Alcohol use: Yes    Comment: occasionally  . Drug use: No  . Sexual activity: Not on file  Lifestyle  . Physical activity    Days per week: Not on file    Minutes per session: Not on file  . Stress: Not on file  Relationships  . Social Herbalist on  phone: Not on file    Gets together: Not on file    Attends religious service: Not on file    Active member of club or organization: Not on file    Attends meetings of clubs or organizations: Not on file    Relationship status: Not on file  . Intimate partner violence    Fear of current or ex partner: Not on file    Emotionally abused: Not on file    Physically abused: Not on file    Forced sexual activity: Not on file  Other Topics Concern  . Not on file  Social History Narrative  . Not on file    Environmental History: The patient lives in a 67 year old house with carpeting in the bedroom and central air/heat.  There is mold/water damage in the home.  There is a dog in the home which has access to his bedroom.  He is a former cigarette smoker having quit in 2011.  Current Outpatient Medications  Medication Sig Dispense Refill  . Ascorbic Acid  (VITAMIN C WITH ROSE HIPS) 500 MG tablet Take 1,000 mg by mouth daily.    Marland Kitchen aspirin EC 81 MG tablet Take 81 mg by mouth daily.    Marland Kitchen atorvastatin (LIPITOR) 10 MG tablet Take 1 tablet (10 mg total) by mouth daily. 90 tablet 1  . B Complex-C (SUPER B COMPLEX PO) Take 1 tablet by mouth daily.    . Calcium Carbonate-Vitamin D (CALCIUM-VITAMIN D3 PO) Take 1 tablet by mouth every other day.    . Cholecalciferol (VITAMIN D3) 1000 units CAPS Take 1,000 Units by mouth daily.    . Garlic 123XX123 MG CAPS Take 1,000 mg by mouth every other day.    Marland Kitchen glucosamine-chondroitin 500-400 MG tablet Take 2 tablets by mouth daily.    Marland Kitchen levocetirizine (XYZAL) 5 MG tablet Take 10 mg by mouth 2 (two) times daily.    Marland Kitchen levothyroxine (SYNTHROID, LEVOTHROID) 100 MCG tablet Take 1 tablet (100 mcg total) by mouth daily before breakfast. 90 tablet 3  . losartan-hydrochlorothiazide (HYZAAR) 100-12.5 MG tablet Take 1 tablet by mouth daily after breakfast. 90 tablet 3  . Magnesium 100 MG TABS Take 100 mg by mouth every other day.    . metFORMIN (GLUCOPHAGE-XR) 500 MG 24 hr tablet Take 2 tablets (1,000 mg total) by mouth at bedtime. 180 tablet 3  . Multiple Vitamins-Minerals (MULTIVITAMIN ADULTS 50+ PO) Take 1 tablet by mouth daily.    . Omega-3 Fatty Acids (OMEGA 3 PO) Take 3 capsules by mouth daily.    . pantoprazole (PROTONIX) 40 MG tablet Take 1 tablet (40 mg total) by mouth daily. 90 tablet 3  . vitamin E 400 UNIT capsule Take 400 Units by mouth every other day.    . allopurinol (ZYLOPRIM) 100 MG tablet Take 1 tablet by mouth 2 times daily (Patient not taking: Reported on 12/16/2018) 180 tablet 1  . Azelastine HCl 0.15 % SOLN Place 2 sprays into both nostrils 2 (two) times daily. 30 mL 5  . Azelastine-Fluticasone 137-50 MCG/ACT SUSP Place 1 spray into the nose 2 (two) times daily as needed. 23 g 2  . fluticasone (FLONASE) 50 MCG/ACT nasal spray Place 2 sprays into both nostrils daily. 1 g 5  . predniSONE (DELTASONE) 10 MG tablet  4 tabs for 2 days, then 3 tabs for 2 days, 2 tabs for 2 days, then 1 tab for 2 days, then stop (Patient not taking: Reported on 12/16/2018) 20 tablet 0  No current facility-administered medications for this visit.     Known medication allergies: Allergies  Allergen Reactions  . Morphine Other (See Comments)    Severe headache    I appreciate the opportunity to take part in Durelle's care. Please do not hesitate to contact me with questions.  Sincerely,   R. Edgar Frisk, MD

## 2018-12-17 NOTE — Assessment & Plan Note (Signed)
The patient's history and physical examination suggest upper airway cough syndrome. We will aggressively treat postnasal drainage and evaluate results.  Treatment plan as outlined above.  If the coughing persists or progresses despite this plan, we will evaluate further. 

## 2018-12-17 NOTE — Assessment & Plan Note (Signed)
   Treatment plan as outlined above for allergic rhinitis.  A prescription has been provided for Pataday, one drop per eye daily as needed. 

## 2018-12-17 NOTE — Patient Instructions (Addendum)
Perennial allergic rhinitis with possible nonallergic component Aeroallergens skin tests revealed reactivity to molds, dog epithelia, and cat hair.  Given the significant symptom increase with the use of CPAP, this may represent allergic rhinosinusitis secondary to mold exposure in the CPAP device, and/or vasomotor rhinosinusitis secondary to the CPAP pressure and/your humidity levels.  I have recommended a thorough cleaning of the CPAP machine to ensure there is no mold present and then monitoring symptoms.  Aeroallergen avoidance measures have been discussed and provided in written form.  A prescription has been provided for azelastine/fluticasone nasal spray, 1 spray per nostril twice daily as needed. Proper nasal spray technique has been discussed and demonstrated.  Nasal saline lavage (NeilMed) has been recommended as needed and prior to medicated nasal sprays along with instructions for proper administration.  For thick post nasal drainage, add guaifenesin 1200 mg (Mucinex Maximum Strength)  twice daily as needed with adequate hydration as discussed.  Cough The patient's history and physical examination suggest upper airway cough syndrome.  We will aggressively treat postnasal drainage and evaluate results.  Treatment plan as outlined above.  If the coughing persists or progresses despite this plan, we will evaluate further.  Allergic conjunctivitis  Treatment plan as outlined above for allergic rhinitis.  A prescription has been provided for Pataday, one drop per eye daily as needed.   Return in about 3 months (around 03/17/2019), or if symptoms worsen or fail to improve.  Control of Mold Allergen  Mold and fungi can grow on a variety of surfaces provided certain temperature and moisture conditions exist.  Outdoor molds grow on plants, decaying vegetation and soil.  The major outdoor mold, Alternaria and Cladosporium, are found in very high numbers during hot and dry conditions.   Generally, a late Summer - Fall peak is seen for common outdoor fungal spores.  Rain will temporarily lower outdoor mold spore count, but counts rise rapidly when the rainy period ends.  The most important indoor molds are Aspergillus and Penicillium.  Dark, humid and poorly ventilated basements are ideal sites for mold growth.  The next most common sites of mold growth are the bathroom and the kitchen.  Outdoor [employer] 1. Use air conditioning and keep windows closed 2. Avoid exposure to decaying vegetation. 3. Avoid leaf raking. 4. Avoid grain handling. 5. Consider wearing a face mask if working in moldy areas.  Indoor Mold Control 1. Maintain humidity below 50%. 2. Clean washable surfaces with 5% bleach solution. 3. Remove sources e.g. Contaminated carpets.  Control of Dog or Cat Allergen  Avoidance is the best way to manage a dog or cat allergy. If you have a dog or cat and are allergic to dog or cats, consider removing the dog or cat from the home. If you have a dog or cat but don't want to find it a new home, or if your family wants a pet even though someone in the household is allergic, here are some strategies that may help keep symptoms at bay:  1. Keep the pet out of your bedroom and restrict it to only a few rooms. Be advised that keeping the dog or cat in only one room will not limit the allergens to that room. 2. Don't pet, hug or kiss the dog or cat; if you do, wash your hands with soap and water. 3. High-efficiency particulate air (HEPA) cleaners run continuously in a bedroom or living room can reduce allergen levels over time. 4. Place electrostatic material sheet in the  air inlet vent in the bedroom. 5. Regular use of a high-efficiency vacuum cleaner or a central vacuum can reduce allergen levels. 6. Giving your dog or cat a bath at least once a week can reduce airborne allergen.

## 2018-12-17 NOTE — Assessment & Plan Note (Signed)
Aeroallergens skin tests revealed reactivity to molds, dog epithelia, and cat hair.  Given the significant symptom increase with the use of CPAP, this may represent allergic rhinosinusitis secondary to mold exposure in the CPAP device, and/or vasomotor rhinosinusitis secondary to the CPAP pressure and/your humidity levels.  I have recommended a thorough cleaning of the CPAP machine to ensure there is no mold present and then monitoring symptoms.  Aeroallergen avoidance measures have been discussed and provided in written form.  A prescription has been provided for azelastine/fluticasone nasal spray, 1 spray per nostril twice daily as needed. Proper nasal spray technique has been discussed and demonstrated.  Nasal saline lavage (NeilMed) has been recommended as needed and prior to medicated nasal sprays along with instructions for proper administration.  For thick post nasal drainage, add guaifenesin 1200 mg (Mucinex Maximum Strength)  twice daily as needed with adequate hydration as discussed.

## 2018-12-23 NOTE — Assessment & Plan Note (Addendum)
evidence of atherosclerosis on imaging Taking atorvastatin daily Check lipid panel, CMP

## 2018-12-23 NOTE — Progress Notes (Signed)
Subjective:    Patient ID: [name], male    DOB: Feb 13, 1950, 67 y.o.   MRN: [id number]  HPI The patient is here for follow up.  He is active with work - Freight forwarder, no regular exercise.  He is walking one day a week with his dog.      Hypertension: He is taking his medication daily. He is compliant with a low sodium diet.  He denies chest pain, palpitations, edema, shortness of breath and regular headaches.  He does not monitor his blood pressure at home.    Diabetes: He is taking his medication daily as prescribed. He is compliant with a diabetic diet.  He checks his feet daily and denies foot lesions. He is up-to-date with an ophthalmology examination.   Hyperlipidemia: He is taking his medication daily. He is compliant with a low fat/cholesterol diet. He denies myalgias.   Hypothyroidism:  She is taking her medication daily.  She denies any recent changes in energy or weight that are unexplained.   GERD:  He is taking his medication daily as prescribed.  He denies any GERD symptoms and feels his GERD is well controlled.   Gout:  He is taking allopurinol daily.  He has some joint pain, but is not sure if it is the gout or arthritis.      Medications and allergies reviewed with patient and updated if appropriate.  Patient Active Problem List   Diagnosis Date Noted  . Cough 12/17/2018  . Allergic conjunctivitis 12/17/2018  . Chronic frontal sinusitis 12/17/2018  . Perennial allergic rhinitis with possible nonallergic component 11/13/2018  . 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 Gout 06/28/2018  . Hallux rigidus of right foot 08/30/2017  . Hypertension 06/26/2017  . OSA (obstructive sleep apnea) 06/26/2017  . Tobacco abuse, in remission 06/26/2017  . Type 2 diabetes mellitus with complication, without long-term current use of insulin ([place]) 06/22/2016  . Hypothyroidism 06/22/2016  . Hyperlipidemia 06/22/2016  . GERD (gastroesophageal reflux disease)  06/22/2016  . Status post cervical spinal fusion 05/23/2016  . Cervical spinal stenosis 04/05/2016  . Pain in right wrist 03/07/2016  . Neck pain 12/01/2015  . Other spondylosis with radiculopathy, cervical region 12/01/2015  . Diverticulitis of colon with perforation 10/20/2010    Current Outpatient Medications on File Prior to Visit  Medication Sig Dispense Refill  . allopurinol (ZYLOPRIM) 100 MG tablet Take 1 tablet by mouth 2 times daily 180 tablet 1  . Ascorbic Acid (VITAMIN C WITH ROSE HIPS) 500 MG tablet Take 1,000 mg by mouth daily.    Marland Kitchen aspirin EC 81 MG tablet Take 81 mg by mouth daily.    Marland Kitchen atorvastatin (LIPITOR) 10 MG tablet Take 1 tablet (10 mg total) by mouth daily. 90 tablet 1  . Azelastine HCl 0.15 % SOLN Place 2 sprays into both nostrils 2 (two) times daily. 30 mL 5  . Azelastine-Fluticasone 137-50 MCG/ACT SUSP Place 1 spray into the nose 2 (two) times daily as needed. 23 g 2  . B Complex-C (SUPER B COMPLEX PO) Take 1 tablet by mouth daily.    . Calcium Carbonate-Vitamin D (CALCIUM-VITAMIN D3 PO) Take 1 tablet by mouth every other day.    . Cholecalciferol (VITAMIN D3) 1000 units CAPS Take 1,000 Units by mouth daily.    . fluticasone (FLONASE) 50 MCG/ACT nasal spray Place 2 sprays into both nostrils daily. 1 g 5  . Garlic 123XX123 MG CAPS Take 1,000 mg by mouth every other day.    Marland Kitchen  glucosamine-chondroitin 500-400 MG tablet Take 2 tablets by mouth daily.    Marland Kitchen levocetirizine (XYZAL) 5 MG tablet Take 10 mg by mouth 2 (two) times daily.    Marland Kitchen levothyroxine (SYNTHROID, LEVOTHROID) 100 MCG tablet Take 1 tablet (100 mcg total) by mouth daily before breakfast. 90 tablet 3  . losartan-hydrochlorothiazide (HYZAAR) 100-12.5 MG tablet Take 1 tablet by mouth daily after breakfast. 90 tablet 3  . Magnesium 100 MG TABS Take 100 mg by mouth every other day.    . metFORMIN (GLUCOPHAGE-XR) 500 MG 24 hr tablet Take 2 tablets (1,000 mg total) by mouth at bedtime. 180 tablet 3  . Multiple  Vitamins-Minerals (MULTIVITAMIN ADULTS 50+ PO) Take 1 tablet by mouth daily.    . Omega-3 Fatty Acids (OMEGA 3 PO) Take 3 capsules by mouth daily.    . pantoprazole (PROTONIX) 40 MG tablet Take 1 tablet (40 mg total) by mouth daily. 90 tablet 3  . vitamin E 400 UNIT capsule Take 400 Units by mouth every other day.     No current facility-administered medications on file prior to visit.    Past Medical History:  Diagnosis Date  . Arthritis   . Diabetes mellitus without complication ([place])   . Diverticulitis of colon   . GERD (gastroesophageal reflux disease)   . Headache   . Hypertension   . Hypothyroidism   . Thyroid disease     Past Surgical History:  Procedure Laterality Date  . ANKLE FRACTURE SURGERY    . ANTERIOR CERVICAL DECOMP/DISCECTOMY FUSION N/A 04/05/2016   Procedure: C5-6, C6-7 Anterior Cervical Discectomy and Fusion, Allograft, Plate;  Surgeon: Marybelle Killings, MD;  Location: [facility];  Service: Orthopedics;  Laterality: N/A;  . ANTERIOR FUSION CERVICAL SPINE  04/05/2016   c6 c7  . CARPAL TUNNEL RELEASE Bilateral   . 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 JOINT REPLACEMENT  11/10   right knee replaced   . KNEE SURGERY    . LEFT HEART CATH AND CORONARY ANGIOGRAPHY N/A 02/06/2017   Procedure: LEFT HEART CATH AND CORONARY ANGIOGRAPHY;  Surgeon: Belva Crome, MD;  Location: [facility];  Service: Cardiovascular;  Laterality: N/A;  . LEG SURGERY  11/03   broken lower right leg crushed     Social History   Socioeconomic History  . Marital status: Divorced    Spouse name: Not on file  . Number of children: Not on file  . Years of education: Not on file  . Highest education level: Not on file  Occupational History  . Not on file  Tobacco Use  . Smoking status: Former Smoker    Types: Cigarettes    Quit date: 01/10/2008    Years since quitting: 10.9  . Smokeless tobacco: Former Systems developer    Types: Chew  Substance and Sexual Activity  . Alcohol use: Yes      Comment: occasionally  . Drug use: No  . Sexual activity: Not on file  Other Topics Concern  . Not on file  Social History Narrative  . Not on file   Social Determinants of Health   Financial Resource Strain:   . Difficulty of Paying Living Expenses: Not on file  Food Insecurity:   . Worried About Charity fundraiser in the Last Year: Not on file  . Ran Out of Food in the Last Year: Not on file  Transportation Needs:   . Lack of Transportation (Medical): Not on file  .  Lack of Transportation (Non-Medical): Not on file  Physical Activity:   . Days of Exercise per Week: Not on file  . Minutes of Exercise per Session: Not on file  Stress:   . Feeling of Stress : Not on file  Social Connections:   . Frequency of Communication with Friends and Family: Not on file  . Frequency of Social Gatherings with Friends and Family: Not on file  . Attends Religious Services: Not on file  . Active Member of Clubs or Organizations: Not on file  . Attends Archivist Meetings: Not on file  . Marital Status: Not on file    Family History  Problem Relation Age of Onset  . Hypertension Mother   . Diabetes Mother   . Heart attack Father   . Heart disease Father   . Diabetes Paternal Grandmother   . Cancer Paternal Grandfather        of eye    Review of Systems  Constitutional: Negative for chills and fever.  Respiratory: Positive for cough (sinus related). Negative for shortness of breath and wheezing.   Cardiovascular: Negative for chest pain, palpitations and leg swelling.  Musculoskeletal: Positive for arthralgias and back pain.  Neurological: Positive for dizziness (? sinus related). Negative for light-headedness, numbness and headaches.       Objective:   Vitals:   12/24/18 0851  BP: (!) 144/80  Pulse: 63  Resp: 16  Temp: 98.5 F (36.9 C)  SpO2: 97%   BP Readings from Last 3 Encounters:  12/24/18 (!) 144/80  12/17/18 132/68  12/16/18 124/70   Wt Readings  from Last 3 Encounters:  12/24/18 199 lb 12.8 oz (90.6 kg)  12/17/18 198 lb 1.9 oz (89.9 kg)  12/16/18 198 lb 9.6 oz (90.1 kg)   Body mass index is 27.87 kg/m.   Physical Exam    Constitutional: Appears well-developed and well-nourished. No distress.  HENT:  Head: Normocephalic and atraumatic.  Neck: Neck supple. No tracheal deviation present. No thyromegaly present.  No cervical lymphadenopathy Cardiovascular: Normal rate, regular rhythm and normal heart sounds.   No murmur heard. No carotid bruit .  No edema Pulmonary/Chest: Effort normal and breath sounds normal. No respiratory distress. No has no wheezes. No rales.  Skin: Skin is warm and dry. Not diaphoretic. Mild onychomycosis right toenails Psychiatric: Normal mood and affect. Behavior is normal.      Assessment & Plan:    See Problem List for Assessment and Plan of chronic medical problems.    This visit occurred during the SARS-CoV-2 public health emergency.  Safety protocols were in place, including screening questions prior to the visit, additional usage of staff PPE, and extensive cleaning of exam room while observing appropriate contact time as indicated for disinfecting solutions.

## 2018-12-23 NOTE — Patient Instructions (Addendum)
  Tests ordered today. Your results will be released to [facility] (or called to you) after review.  If any changes need to be made, you will be notified at that same time.  Prevnar pneumonia immunization administered today.   Medications reviewed and updated.  Changes include :   none  Your prescription(s) have been submitted to your pharmacy. Please take as directed and contact our office if you believe you are having problem(s) with the medication(s).   Please followup in 6 months

## 2018-12-24 ENCOUNTER — Other Ambulatory Visit (INDEPENDENT_AMBULATORY_CARE_PROVIDER_SITE_OTHER): Payer: Medicare Other

## 2018-12-24 ENCOUNTER — Ambulatory Visit (INDEPENDENT_AMBULATORY_CARE_PROVIDER_SITE_OTHER): Payer: Medicare Other | Admitting: Internal Medicine

## 2018-12-24 ENCOUNTER — Other Ambulatory Visit: Payer: Self-pay

## 2018-12-24 ENCOUNTER — Encounter: Payer: Self-pay | Admitting: Internal Medicine

## 2018-12-24 VITALS — BP 144/80 | HR 63 | Temp 98.5°F | Resp 16 | Ht 71.0 in | Wt 199.8 lb

## 2018-12-24 DIAGNOSIS — Z125 Encounter for screening for malignant neoplasm of prostate: Secondary | ICD-10-CM

## 2018-12-24 DIAGNOSIS — I10 Essential (primary) hypertension: Secondary | ICD-10-CM

## 2018-12-24 DIAGNOSIS — E118 Type 2 diabetes mellitus with unspecified complications: Secondary | ICD-10-CM

## 2018-12-24 DIAGNOSIS — Z23 Encounter for immunization: Secondary | ICD-10-CM | POA: Diagnosis not present

## 2018-12-24 DIAGNOSIS — M109 Gout, unspecified: Secondary | ICD-10-CM

## 2018-12-24 DIAGNOSIS — J321 Chronic frontal sinusitis: Secondary | ICD-10-CM | POA: Diagnosis not present

## 2018-12-24 DIAGNOSIS — E782 Mixed hyperlipidemia: Secondary | ICD-10-CM | POA: Diagnosis not present

## 2018-12-24 DIAGNOSIS — E038 Other specified hypothyroidism: Secondary | ICD-10-CM

## 2018-12-24 DIAGNOSIS — K219 Gastro-esophageal reflux disease without esophagitis: Secondary | ICD-10-CM

## 2018-12-24 LAB — COMPREHENSIVE METABOLIC PANEL
ALT: 17 U/L (ref 0–53)
AST: 20 U/L (ref 0–37)
Albumin: 4.3 g/dL (ref 3.5–5.2)
Alkaline Phosphatase: 56 U/L (ref 39–117)
BUN: 25 mg/dL — ABNORMAL HIGH (ref 6–23)
CO2: 28 mEq/L (ref 19–32)
Calcium: 9.7 mg/dL (ref 8.4–10.5)
Chloride: 103 mEq/L (ref 96–112)
Creatinine, Ser: 1.12 mg/dL (ref 0.40–1.50)
GFR: 65.57 mL/min (ref >60.00)
Glucose, Bld: 114 mg/dL — ABNORMAL HIGH (ref 70–99)
Potassium: 4.3 mEq/L (ref 3.5–5.1)
Sodium: 138 mEq/L (ref 135–145)
Total Bilirubin: 0.5 mg/dL (ref 0.2–1.2)
Total Protein: 6.7 g/dL (ref 6.0–8.3)

## 2018-12-24 LAB — CBC WITH DIFFERENTIAL/PLATELET
Basophils Absolute: 0.1 10*3/uL (ref 0.0–0.1)
Basophils Relative: 1.3 % (ref 0.0–3.0)
Eosinophils Absolute: 0.7 10*3/uL (ref 0.0–0.7)
Eosinophils Relative: 11.2 % — ABNORMAL HIGH (ref 0.0–5.0)
HCT: 41.7 % (ref 39.0–52.0)
Hemoglobin: 14 g/dL (ref 13.0–17.0)
Lymphocytes Relative: 27.1 % (ref 12.0–46.0)
Lymphs Abs: 1.8 10*3/uL (ref 0.7–4.0)
MCHC: 33.5 g/dL (ref 30.0–36.0)
MCV: 89.9 fl (ref 78.0–100.0)
Monocytes Absolute: 0.5 10*3/uL (ref 0.1–1.0)
Monocytes Relative: 7.3 % (ref 3.0–12.0)
Neutro Abs: 3.5 10*3/uL (ref 1.4–7.7)
Neutrophils Relative %: 53.1 % (ref 43.0–77.0)
Platelets: 272 10*3/uL (ref 150.0–400.0)
RBC: 4.63 Mil/uL (ref 4.22–5.81)
RDW: 13.2 % (ref 11.5–15.5)
WBC: 6.6 10*3/uL (ref 4.0–10.5)

## 2018-12-24 LAB — LIPID PANEL
Cholesterol: 131 mg/dL (ref 0–200)
HDL: 46.9 mg/dL (ref >39.00)
LDL Cholesterol: 68 mg/dL (ref 0–99)
NonHDL: 84.24
Total CHOL/HDL Ratio: 3
Triglycerides: 81 mg/dL (ref 0.0–149.0)
VLDL: 16.2 mg/dL (ref 0.0–40.0)

## 2018-12-24 LAB — PSA, MEDICARE: PSA: 0.31 ng/ml (ref 0.10–4.00)

## 2018-12-24 LAB — HEMOGLOBIN A1C: Hgb A1c MFr Bld: 6.8 % — ABNORMAL HIGH (ref 4.6–6.5)

## 2018-12-24 LAB — TSH: TSH: 4.39 u[IU]/mL (ref 0.35–4.50)

## 2018-12-24 LAB — URIC ACID: Uric Acid, Serum: 7.3 mg/dL (ref 4.0–7.8)

## 2018-12-24 MED ORDER — LOSARTAN POTASSIUM-HCTZ 100-12.5 MG PO TABS: 1.0000 | ORAL_TABLET | Freq: Every day | ORAL | 3 refills | Status: DC

## 2018-12-24 MED ORDER — METFORMIN HCL ER 500 MG PO TB24: 1000.0000 mg | ORAL_TABLET | Freq: Every day | ORAL | 3 refills | Status: DC

## 2018-12-24 MED ORDER — PANTOPRAZOLE SODIUM 40 MG PO TBEC: 40.0000 mg | DELAYED_RELEASE_TABLET | Freq: Every day | ORAL | 3 refills | Status: DC

## 2018-12-24 MED ORDER — LEVOTHYROXINE SODIUM 100 MCG PO TABS: 100.0000 ug | ORAL_TABLET | Freq: Every day | ORAL | 3 refills | Status: DC

## 2018-12-24 NOTE — Assessment & Plan Note (Signed)
GERD controlled Continue daily medication  

## 2018-12-24 NOTE — Assessment & Plan Note (Signed)
Overall blood pressure well controlled Continue current medications at current doses CMP

## 2018-12-24 NOTE — Assessment & Plan Note (Signed)
Clinically euthyroid Check tsh  Titrate med dose if needed  

## 2018-12-24 NOTE — Assessment & Plan Note (Signed)
Following with an allergist and pulmonary

## 2018-12-24 NOTE — Assessment & Plan Note (Signed)
Check a1c Low sugar / carb diet Stressed regular exercise   

## 2018-12-24 NOTE — Assessment & Plan Note (Signed)
Taking allopurinol daily We will check uric acid level Does have some joint pain/discomfort-likely osteoarthritis, not gout given the chronicity

## 2018-12-27 ENCOUNTER — Ambulatory Visit: Payer: Medicare Other | Admitting: Internal Medicine

## 2019-01-08 LAB — HM DIABETES EYE EXAM

## 2019-01-12 NOTE — Progress Notes (Signed)
Virtual Visit via Telephone Note  I connected with [name] on 01/13/19 at  9:30 AM EST by telephone and verified that I am speaking with the correct person using two identifiers.  Location: Patient: Home Provider: [facility] - [id number] [place], [place], [place] [address]   I discussed the limitations, risks, security and privacy concerns of performing an evaluation and management service by telephone and the availability of in person appointments. I also discussed with the patient that there may be a patient responsible charge related to this service. The patient expressed understanding and agreed to proceed.  Patient consented to consult via telephone: Yes People present and their role in pt care: Pt    History of Present Illness:  69 year old male former smoker followed in our office for obstructive sleep apnea.  PMH: Diabetes, hypertension, CAD Smoker/ Smoking History: Former smoker.  Quit 2011.  80-pack-year smoking history Maintenance:  None Pt of: Dr. Elsworth Soho   Chief complaint: 44-month follow-up  69 year old male former smoker followed in our office for obstructive sleep apnea.  At last office visit patient reported increased nasal congestion.  Blood work revealed peripheral eosinophilia.  Patient was referred to local allergist.  They have completed that office visit.  Patient had multiple environmental allergens positive for molds, cat hair as well as dog dander.  Patient CPAP compliance report continues to be subpar.  See compliance report listed below:  12/09/2018-01/07/2019-CPAP compliance-13 on last 30 days use, 13 of those days greater than 4 hours, average usage 7 hours and 48 minutes, APAP setting 5-15, 95th percentile 12.7, AHI 3.0  Overall patient feels the symptoms have improved slightly since seeing his allergist.  He is continuing to struggle with cleaning his CPAP routinely.  He believes he is doing a good job.  He does feel that he  has significant improvement when he takes his Xyzal.  But his allergist told him to stop taking this daily.  He feels that he has significant improvement when he takes his Xyzal.    Observations/Objective:  07/17/2017-Home sleep study- AHI 23.5 an hour, SaO2 low 77%  07/06/2017-CT chest lung cancer screening- lung RADS 1, annual screening CT without contrast in 12 months >>>Mild centrilobular emphysematous changes, upper lobe predominant mild subpleural reticulation/fibrosis and posterior right lower lobe  07/18/2018 - CT Chest Lung Cancer Screening - Lung RADs 1  12/17/18 - Allergy Testing  Epicutaneous testing: Negative despite a positive histamine control. Intradermal testing: Reactivity to major mold mix #1, #2, #3, #4, cat hair, and dog epithelia.  Social History   Tobacco Use  Smoking Status Former Smoker  . Types: Cigarettes  . Quit date: 01/10/2008  . Years since quitting: 11.0  Smokeless Tobacco Former Systems developer  . Types: Chew   Immunization History  Administered Date(s) Administered  . Influenza, High Dose Seasonal PF 10/12/2017  . Influenza,inj,Quad PF,6+ Mos 10/11/2018  . Pneumococcal Conjugate-13 12/24/2018  . Pneumococcal Polysaccharide-23 11/12/2017  . Tdap 06/27/2017  . Zoster Recombinat (Shingrix) 12/27/2017, 03/28/2018    Assessment and Plan:  OSA (obstructive sleep apnea) Plan: Continue to use CPAP Instructed patient to improve CPAP compliance Believe the patient may benefit from a repeat trial of a daily antihistamine to help improve CPAP adherence Patient to contact DME company to see if they have any other cleaning suggestions Patient also educated that there is a device at [facility] called clean zone that may help him with cleaning his CPAP  Perennial allergic rhinitis with possible nonallergic  component Plan: Continue follow-up with allergy Dr. Verlin Fester Continue nasal medications as recommended by Dr. Madolyn Frieze Continue to clean CPAP Continue nasal saline rinses  twice daily Contact allergy to see if you can restart taking a daily antihistamine as you have found that this significantly helps you with your symptoms   Environmental allergies Plan: Continue follow-up with allergy  Tobacco abuse, in remission Plan: Continue July/2021 CT chest in our lung cancer screening program   Follow Up Instructions:  Return in about 3 months (around 04/13/2019), or if symptoms worsen or fail to improve, for Follow up with Dr. Elsworth Soho.   I discussed the assessment and treatment plan with the patient. The patient was provided an opportunity to ask questions and all were answered. The patient agreed with the plan and demonstrated an understanding of the instructions.   The patient was advised to call back or seek an in-person evaluation if the symptoms worsen or if the condition fails to improve as anticipated.  I provided 24 minutes of non-face-to-face time during this encounter.   Lauraine Rinne, NP

## 2019-01-13 ENCOUNTER — Ambulatory Visit (INDEPENDENT_AMBULATORY_CARE_PROVIDER_SITE_OTHER): Payer: Medicare Other | Admitting: Pulmonary Disease

## 2019-01-13 ENCOUNTER — Other Ambulatory Visit: Payer: Self-pay

## 2019-01-13 ENCOUNTER — Encounter: Payer: Self-pay | Admitting: Pulmonary Disease

## 2019-01-13 DIAGNOSIS — J3089 Other allergic rhinitis: Secondary | ICD-10-CM

## 2019-01-13 DIAGNOSIS — Z9109 Other allergy status, other than to drugs and biological substances: Secondary | ICD-10-CM | POA: Diagnosis not present

## 2019-01-13 DIAGNOSIS — F17201 Nicotine dependence, unspecified, in remission: Secondary | ICD-10-CM

## 2019-01-13 DIAGNOSIS — G4733 Obstructive sleep apnea (adult) (pediatric): Secondary | ICD-10-CM

## 2019-01-13 NOTE — Assessment & Plan Note (Signed)
Plan: Continue July/2021 CT chest in our lung cancer screening program

## 2019-01-13 NOTE — Assessment & Plan Note (Signed)
Plan: Continue follow-up with allergy Dr. Verlin Fester Continue nasal medications as recommended by Dr. Madolyn Frieze Continue to clean CPAP Continue nasal saline rinses twice daily Contact allergy to see if you can restart taking a daily antihistamine as you have found that this significantly helps you with your symptoms

## 2019-01-13 NOTE — Assessment & Plan Note (Signed)
Plan: Continue follow-up with allergy

## 2019-01-13 NOTE — Patient Instructions (Addendum)
You were seen today by Lauraine Rinne, NP  for:   1. OSA (obstructive sleep apnea)  Contact your DME company to see if they have any other suggestions regarding cleaning Could consider investing in a clean zone device from [facility], you should look into this  We recommend that you continue using your CPAP daily >>>Keep up the hard work using your device >>> Goal should be wearing this for the entire night that you are sleeping, at least 4 to 6 hours  Remember:  . Do not drive or operate heavy machinery if tired or drowsy.  . Please notify the supply company and office if you are unable to use your device regularly due to missing supplies or machine being broken.  . Work on maintaining a healthy weight and following your recommended nutrition plan  . Maintain proper daily exercise and movement  . Maintaining proper use of your device can also help improve management of other chronic illnesses such as: Blood pressure, blood sugars, and weight management.   BiPAP/ CPAP Cleaning:  >>>Clean weekly, with Dawn soap, and bottle brush.  Set up to air dry. >>> Wipe mask out daily with wet wipe or towelette    2. Perennial allergic rhinitis with possible nonallergic component 3. Environmental allergies  Continue medications as outlined by allergy Continue nasal saline rinses  I agree with you it is worth contacting allergy to see if they are willing to have you restart daily and histamine of this can improve your CPAP adherence  4. Tobacco abuse, in remission  Continue with the lung cancer screening program with a CT in July/2021 as ordered  Follow Up:    Return in about 3 months (around 04/13/2019), or if symptoms worsen or fail to improve, for Follow up with Dr. Elsworth Soho.   Please do your part to reduce the spread of COVID-19:      Reduce your risk of any infection  and COVID19 by using the similar precautions used for avoiding the common cold or flu:  Marland Kitchen Wash your hands often with soap  and warm water for at least 20 seconds.  If soap and water are not readily available, use an alcohol-based hand sanitizer with at least 60% alcohol.  . If coughing or sneezing, cover your mouth and nose by coughing or sneezing into the elbow areas of your shirt or coat, into a tissue or into your sleeve (not your hands). Langley Gauss A MASK when in public  . Avoid shaking hands with others and consider head nods or verbal greetings only. . Avoid touching your eyes, nose, or mouth with unwashed hands.  . Avoid close contact with people who are sick. . Avoid places or events with large numbers of people in one location, like concerts or sporting events. . If you have some symptoms but not all symptoms, continue to monitor at home and seek medical attention if your symptoms worsen. . If you are having a medical emergency, call 911.   [facility] / e-Visit: [email or web address]         [facility]: [facility]: [id number]                   [facility]: [id number]     It is flu season:   >>> Best ways to protect herself from the flu: Receive the yearly flu vaccine, practice good hand hygiene washing with soap and also  using hand sanitizer when available, eat a nutritious meals, get adequate rest, hydrate appropriately   Please contact the office if your symptoms worsen or you have concerns that you are not improving.   Thank you for choosing [facility] for your healthcare, and for allowing [place] to partner with you on your healthcare journey. I am thankful to be able to provide care to you today.   Wyn Quaker FNP-C

## 2019-01-13 NOTE — Assessment & Plan Note (Signed)
Plan: Continue to use CPAP Instructed patient to improve CPAP compliance Believe the patient may benefit from a repeat trial of a daily antihistamine to help improve CPAP adherence Patient to contact DME company to see if they have any other cleaning suggestions Patient also educated that there is a device at [facility] called clean zone that may help him with cleaning his CPAP

## 2019-01-17 ENCOUNTER — Other Ambulatory Visit: Payer: Self-pay | Admitting: Internal Medicine

## 2019-01-17 DIAGNOSIS — H2513 Age-related nuclear cataract, bilateral: Secondary | ICD-10-CM | POA: Diagnosis not present

## 2019-01-17 DIAGNOSIS — H25013 Cortical age-related cataract, bilateral: Secondary | ICD-10-CM | POA: Diagnosis not present

## 2019-01-17 DIAGNOSIS — H402221 Chronic angle-closure glaucoma, left eye, mild stage: Secondary | ICD-10-CM | POA: Diagnosis not present

## 2019-01-17 DIAGNOSIS — H402212 Chronic angle-closure glaucoma, right eye, moderate stage: Secondary | ICD-10-CM | POA: Diagnosis not present

## 2019-01-20 LAB — HM DIABETES EYE EXAM

## 2019-01-27 ENCOUNTER — Other Ambulatory Visit: Payer: Self-pay | Admitting: Internal Medicine

## 2019-02-06 ENCOUNTER — Other Ambulatory Visit: Payer: Self-pay | Admitting: Internal Medicine

## 2019-02-06 DIAGNOSIS — H402212 Chronic angle-closure glaucoma, right eye, moderate stage: Secondary | ICD-10-CM | POA: Diagnosis not present

## 2019-02-17 ENCOUNTER — Ambulatory Visit: Payer: Medicare Other | Attending: Internal Medicine

## 2019-02-17 DIAGNOSIS — Z20822 Contact with and (suspected) exposure to covid-19: Secondary | ICD-10-CM | POA: Diagnosis not present

## 2019-02-18 LAB — NOVEL CORONAVIRUS, NAA: SARS-CoV-2, NAA: NOT DETECTED

## 2019-02-19 ENCOUNTER — Telehealth: Payer: Self-pay | Admitting: Internal Medicine

## 2019-02-19 MED ORDER — ATORVASTATIN CALCIUM 10 MG PO TABS: 10.0000 mg | ORAL_TABLET | Freq: Every day | ORAL | 1 refills | Status: DC

## 2019-02-19 MED ORDER — ALLOPURINOL 100 MG PO TABS: ORAL_TABLET | ORAL | 1 refills | Status: DC

## 2019-02-19 NOTE — Telephone Encounter (Signed)
    *  STAT* If patient is at the pharmacy, call can be transferred to refill team.   1. Which medications need to be refilled? (please list name of each medication and dose if known)  allopurinol (ZYLOPRIM) 100 MG tablet atorvastatin (LIPITOR) 10 MG tablet  2. Which pharmacy/location (including street and city if local pharmacy) is medication to be sent to [facility]  3. Do they need a 30 day or 90 day supply? [place]

## 2019-02-19 NOTE — Telephone Encounter (Signed)
Rx sent 

## 2019-02-20 ENCOUNTER — Other Ambulatory Visit: Payer: Self-pay

## 2019-02-20 ENCOUNTER — Encounter: Payer: Self-pay | Admitting: Internal Medicine

## 2019-02-20 ENCOUNTER — Ambulatory Visit (INDEPENDENT_AMBULATORY_CARE_PROVIDER_SITE_OTHER): Payer: Medicare Other | Admitting: Internal Medicine

## 2019-02-20 DIAGNOSIS — J069 Acute upper respiratory infection, unspecified: Secondary | ICD-10-CM

## 2019-02-20 DIAGNOSIS — H402221 Chronic angle-closure glaucoma, left eye, mild stage: Secondary | ICD-10-CM | POA: Diagnosis not present

## 2019-02-20 MED ORDER — ATORVASTATIN CALCIUM 10 MG PO TABS: 10.0000 mg | ORAL_TABLET | Freq: Every day | ORAL | 1 refills | Status: DC

## 2019-02-20 MED ORDER — DOXYCYCLINE HYCLATE 100 MG PO TABS: 100.0000 mg | ORAL_TABLET | Freq: Two times a day (BID) | ORAL | 0 refills | Status: DC

## 2019-02-20 MED ORDER — ALLOPURINOL 100 MG PO TABS: ORAL_TABLET | ORAL | 1 refills | Status: DC

## 2019-02-20 MED ORDER — HYDROCODONE-HOMATROPINE 5-1.5 MG/5ML PO SYRP: 5.0000 mL | ORAL_SOLUTION | Freq: Three times a day (TID) | ORAL | 0 refills | Status: DC | PRN

## 2019-02-20 NOTE — Assessment & Plan Note (Signed)
Acute Covid negative Concern for bacterial infection Start doxycycline twice daily x10 days Hycodan cough syrup Continue Mucinex, Tylenol as needed Rest, fluids Call if no improvement

## 2019-02-20 NOTE — Progress Notes (Signed)
Virtual Visit via Video Note  I connected with [name] on 02/20/19 at  9:30 AM EST by a video enabled telemedicine application and verified that I am speaking with the correct person using two identifiers.   I discussed the limitations of evaluation and management by telemedicine and the availability of in person appointments. The patient expressed understanding and agreed to proceed.  Present for the visit:  Myself, Dr Billey Gosling, Wendee Beavers.  The patient is currently at home and I am in the office.    No referring provider.    History of Present Illness: He is here for an acute visit for cold symptoms.  His symptoms started 3 days ago.  Because they were cold symptoms he did go and get tested for Covid and it was negative.  He is experiencing headaches, nasal congestion, sore throat, productive cough, wheezing, which typically resolves with cough.  He denies any fevers, shortness of breath or chest pain.  He has tried taking mucinex, tylenol    Review of Systems  Constitutional: Negative for chills and fever.  HENT: Positive for congestion and sore throat.   Respiratory: Positive for cough, sputum production and wheezing (resolves with cough). Negative for shortness of breath.   Cardiovascular: Negative for chest pain.  Neurological: Positive for headaches.     Social History   Socioeconomic History  . Marital status: Divorced    Spouse name: Not on file  . Number of children: Not on file  . Years of education: Not on file  . Highest education level: Not on file  Occupational History  . Not on file  Tobacco Use  . Smoking status: Former Smoker    Types: Cigarettes    Quit date: 01/10/2008    Years since quitting: 11.1  . Smokeless tobacco: Former Systems developer    Types: Chew  Substance and Sexual Activity  . Alcohol use: Yes    Comment: occasionally  . Drug use: No  . Sexual activity: Not on file  Other Topics Concern  . Not on file  Social History  Narrative  . Not on file   Social Determinants of Health   Financial Resource Strain:   . Difficulty of Paying Living Expenses: Not on file  Food Insecurity:   . Worried About Charity fundraiser in the Last Year: Not on file  . Ran Out of Food in the Last Year: Not on file  Transportation Needs:   . Lack of Transportation (Medical): Not on file  . Lack of Transportation (Non-Medical): Not on file  Physical Activity:   . Days of Exercise per Week: Not on file  . Minutes of Exercise per Session: Not on file  Stress:   . Feeling of Stress : Not on file  Social Connections:   . Frequency of Communication with Friends and Family: Not on file  . Frequency of Social Gatherings with Friends and Family: Not on file  . Attends Religious Services: Not on file  . Active Member of Clubs or Organizations: Not on file  . Attends Archivist Meetings: Not on file  . Marital Status: Not on file     Observations/Objective: Appears well in NAD Breathing normally, speaking in full sentences, skin appears warm and dry  Assessment and Plan:  See Problem List for Assessment and Plan of chronic medical problems.   Follow Up Instructions:    I discussed the assessment and treatment plan with the patient. The patient was provided an opportunity  to ask questions and all were answered. The patient agreed with the plan and demonstrated an understanding of the instructions.   The patient was advised to call back or seek an in-person evaluation if the symptoms worsen or if the condition fails to improve as anticipated.    Binnie Rail, MD

## 2019-03-07 DIAGNOSIS — H402221 Chronic angle-closure glaucoma, left eye, mild stage: Secondary | ICD-10-CM | POA: Diagnosis not present

## 2019-03-07 DIAGNOSIS — H25013 Cortical age-related cataract, bilateral: Secondary | ICD-10-CM | POA: Diagnosis not present

## 2019-03-07 DIAGNOSIS — H402212 Chronic angle-closure glaucoma, right eye, moderate stage: Secondary | ICD-10-CM | POA: Diagnosis not present

## 2019-03-07 DIAGNOSIS — H2513 Age-related nuclear cataract, bilateral: Secondary | ICD-10-CM | POA: Diagnosis not present

## 2019-03-17 ENCOUNTER — Telehealth: Payer: Self-pay | Admitting: Internal Medicine

## 2019-03-17 MED ORDER — METFORMIN HCL ER 500 MG PO TB24: 1000.0000 mg | ORAL_TABLET | Freq: Every day | ORAL | 3 refills | Status: DC

## 2019-03-17 NOTE — Telephone Encounter (Signed)
New Message:   1.Medication Requested:metFORMIN (GLUCOPHAGE-XR) 500 MG 24 hr tablet  2. Pharmacy (Name, Street, [place]): [facility], [place] 3. On Med List: yes  4. Last Visit with PCP: 02/20/19  5. Next visit date with PCP:06/24/19  Pt states the pharmacy is needing a new prescription for the new dosage the patient is currently taken.  Agent: Please be advised that RX refills may take up to 3 business days. We ask that you follow-up with your pharmacy.

## 2019-03-17 NOTE — Telephone Encounter (Signed)
Rx sent 

## 2019-03-18 ENCOUNTER — Ambulatory Visit: Payer: Medicare Other | Admitting: Allergy and Immunology

## 2019-03-25 ENCOUNTER — Ambulatory Visit: Payer: Medicare Other | Admitting: Allergy and Immunology

## 2019-05-08 ENCOUNTER — Ambulatory Visit: Payer: Medicare Other

## 2019-05-13 ENCOUNTER — Encounter: Payer: Self-pay | Admitting: Allergy and Immunology

## 2019-05-13 ENCOUNTER — Ambulatory Visit (INDEPENDENT_AMBULATORY_CARE_PROVIDER_SITE_OTHER): Payer: Medicare Other | Admitting: Allergy and Immunology

## 2019-05-13 ENCOUNTER — Other Ambulatory Visit: Payer: Self-pay

## 2019-05-13 DIAGNOSIS — J3089 Other allergic rhinitis: Secondary | ICD-10-CM | POA: Diagnosis not present

## 2019-05-13 DIAGNOSIS — H1013 Acute atopic conjunctivitis, bilateral: Secondary | ICD-10-CM

## 2019-05-13 DIAGNOSIS — R05 Cough: Secondary | ICD-10-CM

## 2019-05-13 DIAGNOSIS — R059 Cough, unspecified: Secondary | ICD-10-CM

## 2019-05-13 MED ORDER — AZELASTINE HCL 0.15 % NA SOLN: 1.0000 | Freq: Two times a day (BID) | NASAL | 5 refills | Status: DC

## 2019-05-13 NOTE — Assessment & Plan Note (Signed)
The patient's history and physical examination suggest upper airway cough syndrome.    Treatment plan as outlined above.  If the coughing persists or progresses despite this plan, we will evaluate further.

## 2019-05-13 NOTE — Patient Instructions (Addendum)
Perennial allergic rhinitis with possible nonallergic component  Continue appropriate aeroallergen avoidance measures.  A refill prescription has been provided for azelastine nasal spray, 1-2 sprays per nostril twice daily as needed.   Continue fluticasone nasal spray 1 to 2 sprays per nostril daily as needed.  The patient will call his ophthalmologist to inform of medication list for recommendations.  Nasal saline lavage (NeilMed) has been recommended as needed and prior to medicated nasal sprays along with instructions for proper administration.  While outdoors and mowing lawns, I have recommended having nasal saline spray (i.e. Simply Saline) on hand for frequent as needed use.  For thick post nasal drainage, add guaifenesin 1200 mg (Mucinex Maximum Strength)  twice daily as needed with adequate hydration as discussed.  If allergen avoidance measures and medications fail to adequately relieve symptoms, aeroallergen immunotherapy will be considered.  Allergic conjunctivitis  Treatment plan as outlined above for allergic rhinitis.  I have also recommended frequent use of eye lubricant drops (i.e., Natural Tears) as needed.  The patient will check with his ophthalmologist regarding the use of Pataday eyedrops.  Cough The patient's history and physical examination suggest upper airway cough syndrome.    Treatment plan as outlined above.  If the coughing persists or progresses despite this plan, we will evaluate further.   Return in about 4 months (around 09/13/2019), or if symptoms worsen or fail to improve.

## 2019-05-13 NOTE — Progress Notes (Signed)
Follow-up Note  RE: [name] MRN: [id number] DOB: 1950/01/16 Date of Office Visit: 05/13/2019  Primary care provider: Binnie Rail, MD Referring provider: Binnie Rail, MD  History of present illness: [name] is a 69 y.o. male with allergic rhinoconjunctivitis and history of cough presenting today for follow-up.  He reports that he has been experiencing nasal congestion, rhinorrhea, sneezing, postnasal drainage, coughing, nasal pruritus, ocular pruritus, and frontal headaches.  He attributes these symptoms to pollen exposure while mowing lawns and "starting up pollen."  He has been using fluticasone nasal spray, 1 spray per nostril daily but notes that he ran out of azelastine nasal spray.  He has used levocetirizine with some relief. He does not complain of shortness of breath, chest tightness, or wheezing.  Assessment and plan: Perennial allergic rhinitis with possible nonallergic component  Continue appropriate aeroallergen avoidance measures.  A refill prescription has been provided for azelastine nasal spray, 1-2 sprays per nostril twice daily as needed.   Continue fluticasone nasal spray 1 to 2 sprays per nostril daily as needed.  The patient will call his ophthalmologist to inform of medication list for recommendations.  Nasal saline lavage (NeilMed) has been recommended as needed and prior to medicated nasal sprays along with instructions for proper administration.  While outdoors and mowing lawns, I have recommended having nasal saline spray (i.e. Simply Saline) on hand for frequent as needed use.  For thick post nasal drainage, add guaifenesin 1200 mg (Mucinex Maximum Strength)  twice daily as needed with adequate hydration as discussed.  If allergen avoidance measures and medications fail to adequately relieve symptoms, aeroallergen immunotherapy will be considered.  Allergic conjunctivitis  Treatment plan as outlined above for allergic  rhinitis.  I have also recommended frequent use of eye lubricant drops (i.e., Natural Tears) as needed.  The patient will check with his ophthalmologist regarding the use of Pataday eyedrops.  Cough The patient's history and physical examination suggest upper airway cough syndrome.    Treatment plan as outlined above.  If the coughing persists or progresses despite this plan, we will evaluate further.   Meds ordered this encounter  Medications  . Azelastine HCl 0.15 % SOLN    Sig: Place 1-2 sprays into both nostrils 2 (two) times daily.    Dispense:  30 mL    Refill:  5    Physical examination: Blood pressure 138/60, pulse 70, temperature (!) 97 F (36.1 C), temperature source Temporal, resp. rate 18, height 5\' 11"  (1.803 m), weight 199 lb 12.8 oz (90.6 kg), SpO2 96 %.  General: Alert, interactive, in no acute distress. HEENT: TMs pearly gray, turbinates moderately edematous with thick discharge, post-pharynx moderately erythematous. Neck: Supple without lymphadenopathy. Lungs: Clear to auscultation without wheezing, rhonchi or rales. CV: Normal S1, S2 without murmurs. Skin: Warm and dry, without lesions or rashes.  The following portions of the patient's history were reviewed and updated as appropriate: allergies, current medications, past family history, past medical history, past social history, past surgical history and problem list.  Current Outpatient Medications  Medication Sig Dispense Refill  . Ascorbic Acid (VITAMIN C WITH ROSE HIPS) 500 MG tablet Take 1,000 mg by mouth daily.    Marland Kitchen atorvastatin (LIPITOR) 10 MG tablet Take 1 tablet (10 mg total) by mouth daily. 90 tablet 1  . Azelastine HCl 0.15 % SOLN Place 1-2 sprays into both nostrils 2 (two) times daily. 30 mL 5  . Azelastine-Fluticasone 137-50 MCG/ACT SUSP Place 1 spray into  the nose 2 (two) times daily as needed. 23 g 2  . B Complex-C (SUPER B COMPLEX PO) Take 1 tablet by mouth daily.    . Calcium  Carbonate-Vitamin D (CALCIUM-VITAMIN D3 PO) Take 1 tablet by mouth every other day.    . Cholecalciferol (VITAMIN D3) 1000 units CAPS Take 1,000 Units by mouth daily.    . dorzolamide (TRUSOPT) 2 % ophthalmic solution Apply to eye.    . fluticasone (FLONASE) 50 MCG/ACT nasal spray Place 2 sprays into both nostrils daily. 1 g 5  . Garlic 123XX123 MG CAPS Take 1,000 mg by mouth every other day.    Marland Kitchen glucosamine-chondroitin 500-400 MG tablet Take 2 tablets by mouth daily.    Marland Kitchen HYDROcodone-homatropine (HYCODAN) 5-1.5 MG/5ML syrup Take 5 mLs by mouth every 8 (eight) hours as needed for cough. 120 mL 0  . levocetirizine (XYZAL) 5 MG tablet Take 10 mg by mouth 2 (two) times daily.    Marland Kitchen levothyroxine (SYNTHROID) 100 MCG tablet TAKE 1 TABLET (100 MCG TOTAL) BY MOUTH DAILY BEFORE BREAKFAST. 90 tablet 1  . losartan-hydrochlorothiazide (HYZAAR) 100-12.5 MG tablet TAKE 1 TABLET BY MOUTH DAILY AFTER BREAKFAST. 90 tablet 1  . Magnesium 100 MG TABS Take 100 mg by mouth every other day.    . metFORMIN (GLUCOPHAGE-XR) 500 MG 24 hr tablet Take 2 tablets (1,000 mg total) by mouth at bedtime. 180 tablet 3  . Multiple Vitamins-Minerals (MULTIVITAMIN ADULTS 50+ PO) Take 1 tablet by mouth daily.    . Omega-3 Fatty Acids (OMEGA 3 PO) Take 3 capsules by mouth daily.    . pantoprazole (PROTONIX) 40 MG tablet TAKE 1 TABLET BY MOUTH EVERY DAY 90 tablet 1  . vitamin E 400 UNIT capsule Take 400 Units by mouth every other day.     No current facility-administered medications for this visit.    Allergies  Allergen Reactions  . Morphine Other (See Comments)    Severe headache Severe headache Severe headache Severe headache   Review of systems: Review of systems negative except as noted in HPI / PMHx.  Past Medical History:  Diagnosis Date  . Arthritis   . Diabetes mellitus without complication ([place])   . Diverticulitis of colon   . GERD (gastroesophageal reflux disease)   . Headache   . Hypertension   .  Hypothyroidism   . Thyroid disease     Family History  Problem Relation Age of Onset  . Hypertension Mother   . Diabetes Mother   . Heart attack Father   . Heart disease Father   . Diabetes Paternal Grandmother   . Cancer Paternal Grandfather        of eye    Social History   Socioeconomic History  . Marital status: Divorced    Spouse name: Not on file  . Number of children: Not on file  . Years of education: Not on file  . Highest education level: Not on file  Occupational History  . Not on file  Tobacco Use  . Smoking status: Former Smoker    Types: Cigarettes    Quit date: 01/10/2008    Years since quitting: 11.3  . Smokeless tobacco: Former Systems developer    Types: Chew  Substance and Sexual Activity  . Alcohol use: Yes    Comment: occasionally  . Drug use: No  . Sexual activity: Not on file  Other Topics Concern  . Not on file  Social History Narrative  . Not on file   Social Determinants  of Health   Financial Resource Strain:   . Difficulty of Paying Living Expenses:   Food Insecurity:   . Worried About Charity fundraiser in the Last Year:   . Arboriculturist in the Last Year:   Transportation Needs:   . Film/video editor (Medical):   Marland Kitchen Lack of Transportation (Non-Medical):   Physical Activity:   . Days of Exercise per Week:   . Minutes of Exercise per Session:   Stress:   . Feeling of Stress :   Social Connections:   . Frequency of Communication with Friends and Family:   . Frequency of Social Gatherings with Friends and Family:   . Attends Religious Services:   . Active Member of Clubs or Organizations:   . Attends Archivist Meetings:   Marland Kitchen Marital Status:   Intimate Partner Violence:   . Fear of Current or Ex-Partner:   . Emotionally Abused:   Marland Kitchen Physically Abused:   . Sexually Abused:    I appreciate the opportunity to take part in Marbin's care. Please do not hesitate to contact me with questions.  Sincerely,   R. Edgar Frisk,  MD

## 2019-05-13 NOTE — Assessment & Plan Note (Addendum)
   Continue appropriate aeroallergen avoidance measures.  A refill prescription has been provided for azelastine nasal spray, 1-2 sprays per nostril twice daily as needed.   Continue fluticasone nasal spray 1 to 2 sprays per nostril daily as needed.  The patient will call his ophthalmologist to inform of medication list for recommendations.  Nasal saline lavage (NeilMed) has been recommended as needed and prior to medicated nasal sprays along with instructions for proper administration.  While outdoors and mowing lawns, I have recommended having nasal saline spray (i.e. Simply Saline) on hand for frequent as needed use.  For thick post nasal drainage, add guaifenesin 1200 mg (Mucinex Maximum Strength)  twice daily as needed with adequate hydration as discussed.  If allergen avoidance measures and medications fail to adequately relieve symptoms, aeroallergen immunotherapy will be considered.

## 2019-05-13 NOTE — Assessment & Plan Note (Signed)
   Treatment plan as outlined above for allergic rhinitis.  I have also recommended frequent use of eye lubricant drops (i.e., Natural Tears) as needed.  The patient will check with his ophthalmologist regarding the use of Pataday eyedrops.

## 2019-05-19 ENCOUNTER — Other Ambulatory Visit: Payer: Self-pay

## 2019-05-19 MED ORDER — METFORMIN HCL ER 500 MG PO TB24: ORAL_TABLET | ORAL | 3 refills | Status: DC

## 2019-06-03 DIAGNOSIS — M5031 Other cervical disc degeneration,  high cervical region: Secondary | ICD-10-CM | POA: Diagnosis not present

## 2019-06-03 DIAGNOSIS — M5431 Sciatica, right side: Secondary | ICD-10-CM | POA: Diagnosis not present

## 2019-06-03 DIAGNOSIS — M9901 Segmental and somatic dysfunction of cervical region: Secondary | ICD-10-CM | POA: Diagnosis not present

## 2019-06-03 DIAGNOSIS — M9903 Segmental and somatic dysfunction of lumbar region: Secondary | ICD-10-CM | POA: Diagnosis not present

## 2019-06-11 DIAGNOSIS — M5431 Sciatica, right side: Secondary | ICD-10-CM | POA: Diagnosis not present

## 2019-06-11 DIAGNOSIS — M9901 Segmental and somatic dysfunction of cervical region: Secondary | ICD-10-CM | POA: Diagnosis not present

## 2019-06-11 DIAGNOSIS — M9903 Segmental and somatic dysfunction of lumbar region: Secondary | ICD-10-CM | POA: Diagnosis not present

## 2019-06-11 DIAGNOSIS — M5031 Other cervical disc degeneration,  high cervical region: Secondary | ICD-10-CM | POA: Diagnosis not present

## 2019-06-16 DIAGNOSIS — M5431 Sciatica, right side: Secondary | ICD-10-CM | POA: Diagnosis not present

## 2019-06-16 DIAGNOSIS — M9903 Segmental and somatic dysfunction of lumbar region: Secondary | ICD-10-CM | POA: Diagnosis not present

## 2019-06-16 DIAGNOSIS — M9901 Segmental and somatic dysfunction of cervical region: Secondary | ICD-10-CM | POA: Diagnosis not present

## 2019-06-16 DIAGNOSIS — M5031 Other cervical disc degeneration,  high cervical region: Secondary | ICD-10-CM | POA: Diagnosis not present

## 2019-06-23 DIAGNOSIS — M9903 Segmental and somatic dysfunction of lumbar region: Secondary | ICD-10-CM | POA: Diagnosis not present

## 2019-06-23 DIAGNOSIS — M5031 Other cervical disc degeneration,  high cervical region: Secondary | ICD-10-CM | POA: Diagnosis not present

## 2019-06-23 DIAGNOSIS — M5431 Sciatica, right side: Secondary | ICD-10-CM | POA: Diagnosis not present

## 2019-06-23 DIAGNOSIS — M9901 Segmental and somatic dysfunction of cervical region: Secondary | ICD-10-CM | POA: Diagnosis not present

## 2019-06-23 NOTE — Patient Instructions (Addendum)
  Blood work was ordered.   ° ° °Medications reviewed and updated.  Changes include :   none ° ° ° °Please followup in 6 months ° ° °

## 2019-06-23 NOTE — Progress Notes (Signed)
Subjective:    Patient ID: [name], male    DOB: 1950/03/03, 69 y.o.   MRN: [id number]  HPI The patient is here for follow up of their chronic medical problems, including htn, DM, hyperlipidemia, hypothyroidism, GERD, gout.  He is taking all of his medications as prescribed.    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  He has lost some weight which he thinks is related to increased activity.  He thinks he is doing pretty good watching the sugars and carbohydrates, but could do better    Medications and allergies reviewed with patient and updated if appropriate.  Patient Active Problem List   Diagnosis Date Noted  . Environmental allergies 01/13/2019  . Cough 12/17/2018  . Allergic conjunctivitis 12/17/2018  . Chronic frontal sinusitis 12/17/2018  . Perennial allergic rhinitis with possible nonallergic component 11/13/2018  . 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 Gout 06/28/2018  . URI (upper respiratory infection) 11/26/2017  . Hallux rigidus of right foot 08/30/2017  . Hypertension 06/26/2017  . OSA (obstructive sleep apnea) 06/26/2017  . Tobacco abuse, in remission 06/26/2017  . Type 2 diabetes mellitus with complication, without long-term current use of insulin ([place]) 06/22/2016  . Hypothyroidism 06/22/2016  . Hyperlipidemia 06/22/2016  . GERD (gastroesophageal reflux disease) 06/22/2016  . Status post cervical spinal fusion 05/23/2016  . Cervical spinal stenosis 04/05/2016  . Pain in right wrist 03/07/2016  . Neck pain 12/01/2015  . Other spondylosis with radiculopathy, cervical region 12/01/2015  . Diverticulitis of colon with perforation 10/20/2010    Current Outpatient Medications on File Prior to Visit  Medication Sig Dispense Refill  . Ascorbic Acid (VITAMIN C WITH ROSE HIPS) 500 MG tablet Take 1,000 mg by mouth daily.    Marland Kitchen atorvastatin (LIPITOR) 10 MG tablet Take 1 tablet (10 mg total) by mouth daily. 90 tablet 1  .  Azelastine HCl 0.15 % SOLN Place 1-2 sprays into both nostrils 2 (two) times daily. 30 mL 5  . Azelastine-Fluticasone 137-50 MCG/ACT SUSP Place 1 spray into the nose 2 (two) times daily as needed. 23 g 2  . B Complex-C (SUPER B COMPLEX PO) Take 1 tablet by mouth daily.    . Calcium Carbonate-Vitamin D (CALCIUM-VITAMIN D3 PO) Take 1 tablet by mouth every other day.    . Cholecalciferol (VITAMIN D3) 1000 units CAPS Take 1,000 Units by mouth daily.    . dorzolamide (TRUSOPT) 2 % ophthalmic solution Apply to eye.    . fluticasone (FLONASE) 50 MCG/ACT nasal spray Place 2 sprays into both nostrils daily. 1 g 5  . Garlic 4315 MG CAPS Take 1,000 mg by mouth every other day.    Marland Kitchen glucosamine-chondroitin 500-400 MG tablet Take 2 tablets by mouth daily.    Marland Kitchen levocetirizine (XYZAL) 5 MG tablet Take 10 mg by mouth 2 (two) times daily.    . Magnesium 100 MG TABS Take 100 mg by mouth every other day.    . Multiple Vitamins-Minerals (MULTIVITAMIN ADULTS 50+ PO) Take 1 tablet by mouth daily.    . Omega-3 Fatty Acids (OMEGA 3 PO) Take 3 capsules by mouth daily.    . vitamin E 400 UNIT capsule Take 400 Units by mouth every other day.     No current facility-administered medications on file prior to visit.    Past Medical History:  Diagnosis Date  . Arthritis   . Diabetes mellitus without complication ([place])   . Diverticulitis of colon   . GERD (gastroesophageal reflux  disease)   . Headache   . Hypertension   . Hypothyroidism   . Thyroid disease     Past Surgical History:  Procedure Laterality Date  . ANKLE FRACTURE SURGERY    . ANTERIOR CERVICAL DECOMP/DISCECTOMY FUSION N/A 04/05/2016   Procedure: C5-6, C6-7 Anterior Cervical Discectomy and Fusion, Allograft, Plate;  Surgeon: Marybelle Killings, MD;  Location: [facility];  Service: Orthopedics;  Laterality: N/A;  . ANTERIOR FUSION CERVICAL SPINE  04/05/2016   c6 c7  . CARPAL TUNNEL RELEASE Bilateral   . 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 JOINT  REPLACEMENT  11/10   right knee replaced   . KNEE SURGERY    . LEFT HEART CATH AND CORONARY ANGIOGRAPHY N/A 02/06/2017   Procedure: LEFT HEART CATH AND CORONARY ANGIOGRAPHY;  Surgeon: Belva Crome, MD;  Location: [facility];  Service: Cardiovascular;  Laterality: N/A;  . LEG SURGERY  11/03   broken lower right leg crushed     Social History   Socioeconomic History  . Marital status: Divorced    Spouse name: Not on file  . Number of children: Not on file  . Years of education: Not on file  . Highest education level: Not on file  Occupational History  . Not on file  Tobacco Use  . Smoking status: Former Smoker    Types: Cigarettes    Quit date: 01/10/2008    Years since quitting: 11.4  . Smokeless tobacco: Former Systems developer    Types: Secondary school teacher  . Vaping Use: Never used  Substance and Sexual Activity  . Alcohol use: Yes    Comment: occasionally  . Drug use: No  . Sexual activity: Not on file  Other Topics Concern  . Not on file  Social History Narrative  . Not on file   Social Determinants of Health   Financial Resource Strain:   . Difficulty of Paying Living Expenses:   Food Insecurity:   . Worried About Charity fundraiser in the Last Year:   . Arboriculturist in the Last Year:   Transportation Needs:   . Film/video editor (Medical):   Marland Kitchen Lack of Transportation (Non-Medical):   Physical Activity:   . Days of Exercise per Week:   . Minutes of Exercise per Session:   Stress:   . Feeling of Stress :   Social Connections:   . Frequency of Communication with Friends and Family:   . Frequency of Social Gatherings with Friends and Family:   . Attends Religious Services:   . Active Member of Clubs or Organizations:   . Attends Archivist Meetings:   Marland Kitchen Marital Status:     Family History  Problem Relation Age of Onset  . Hypertension Mother   . Diabetes Mother   . Heart attack Father   . Heart disease Father   . Diabetes Paternal  Grandmother   . Cancer Paternal Grandfather        of eye    Review of Systems  Constitutional: Negative for chills and fever.  HENT: Positive for postnasal drip.   Respiratory: Negative for cough, shortness of breath and wheezing.   Cardiovascular: Negative for chest pain, palpitations and leg swelling.  Musculoskeletal: Positive for arthralgias (arthritis).  Neurological: Positive for headaches (sinus, allergies).       Objective:   Vitals:   06/24/19 0928  BP: 128/80  Pulse: 69   BP Readings  from Last 3 Encounters:  06/24/19 128/80  06/24/19 128/80  05/13/19 138/60   Wt Readings from Last 3 Encounters:  06/24/19 193 lb (87.5 kg)  06/24/19 193 lb 12.8 oz (87.9 kg)  05/13/19 199 lb 12.8 oz (90.6 kg)   Body mass index is 26.92 kg/m.   Physical Exam    Constitutional: Appears well-developed and well-nourished. No distress.  HENT:  Head: Normocephalic and atraumatic.  Neck: Neck supple. No tracheal deviation present. No thyromegaly present.  No cervical lymphadenopathy Cardiovascular: Normal rate, regular rhythm and normal heart sounds.   No murmur heard. No carotid bruit .  No edema Pulmonary/Chest: Effort normal and breath sounds normal. No respiratory distress. No has no wheezes. No rales.  Skin: Skin is warm and dry. Not diaphoretic.  Psychiatric: Normal mood and affect. Behavior is normal.      Assessment & Plan:    See Problem List for Assessment and Plan of chronic medical problems.    This visit occurred during the SARS-CoV-2 public health emergency.  Safety protocols were in place, including screening questions prior to the visit, additional usage of staff PPE, and extensive cleaning of exam room while observing appropriate contact time as indicated for disinfecting solutions.

## 2019-06-24 ENCOUNTER — Other Ambulatory Visit: Payer: Self-pay

## 2019-06-24 ENCOUNTER — Ambulatory Visit (INDEPENDENT_AMBULATORY_CARE_PROVIDER_SITE_OTHER): Payer: Medicare Other | Admitting: Internal Medicine

## 2019-06-24 ENCOUNTER — Ambulatory Visit: Payer: Medicare Other | Admitting: Internal Medicine

## 2019-06-24 ENCOUNTER — Ambulatory Visit (INDEPENDENT_AMBULATORY_CARE_PROVIDER_SITE_OTHER): Payer: Medicare Other

## 2019-06-24 ENCOUNTER — Encounter: Payer: Self-pay | Admitting: Internal Medicine

## 2019-06-24 VITALS — BP 128/80 | HR 69 | Temp 98.4°F | Resp 16 | Ht 71.0 in | Wt 193.8 lb

## 2019-06-24 VITALS — BP 128/80 | HR 69 | Wt 193.0 lb

## 2019-06-24 DIAGNOSIS — E782 Mixed hyperlipidemia: Secondary | ICD-10-CM | POA: Diagnosis not present

## 2019-06-24 DIAGNOSIS — E118 Type 2 diabetes mellitus with unspecified complications: Secondary | ICD-10-CM | POA: Diagnosis not present

## 2019-06-24 DIAGNOSIS — I10 Essential (primary) hypertension: Secondary | ICD-10-CM | POA: Diagnosis not present

## 2019-06-24 DIAGNOSIS — E038 Other specified hypothyroidism: Secondary | ICD-10-CM | POA: Diagnosis not present

## 2019-06-24 DIAGNOSIS — Z0000 Encounter for general adult medical examination without abnormal findings: Secondary | ICD-10-CM

## 2019-06-24 DIAGNOSIS — K219 Gastro-esophageal reflux disease without esophagitis: Secondary | ICD-10-CM

## 2019-06-24 LAB — COMPREHENSIVE METABOLIC PANEL
ALT: 21 U/L (ref 0–53)
AST: 25 U/L (ref 0–37)
Albumin: 4.3 g/dL (ref 3.5–5.2)
Alkaline Phosphatase: 57 U/L (ref 39–117)
BUN: 28 mg/dL — ABNORMAL HIGH (ref 6–23)
CO2: 25 mEq/L (ref 19–32)
Calcium: 9.8 mg/dL (ref 8.4–10.5)
Chloride: 103 mEq/L (ref 96–112)
Creatinine, Ser: 1.22 mg/dL (ref 0.40–1.50)
GFR: 59.32 mL/min — ABNORMAL LOW (ref >60.00)
Glucose, Bld: 112 mg/dL — ABNORMAL HIGH (ref 70–99)
Potassium: 4.2 mEq/L (ref 3.5–5.1)
Sodium: 136 mEq/L (ref 135–145)
Total Bilirubin: 0.5 mg/dL (ref 0.2–1.2)
Total Protein: 7.1 g/dL (ref 6.0–8.3)

## 2019-06-24 LAB — LIPID PANEL
Cholesterol: 122 mg/dL (ref 0–200)
HDL: 41.9 mg/dL (ref >39.00)
LDL Cholesterol: 58 mg/dL (ref 0–99)
NonHDL: 80.51
Total CHOL/HDL Ratio: 3
Triglycerides: 112 mg/dL (ref 0.0–149.0)
VLDL: 22.4 mg/dL (ref 0.0–40.0)

## 2019-06-24 LAB — HEMOGLOBIN A1C: Hgb A1c MFr Bld: 7.4 % — ABNORMAL HIGH (ref 4.6–6.5)

## 2019-06-24 LAB — TSH: TSH: 2.92 u[IU]/mL (ref 0.35–4.50)

## 2019-06-24 MED ORDER — LOSARTAN POTASSIUM-HCTZ 100-12.5 MG PO TABS: 1.0000 | ORAL_TABLET | Freq: Every day | ORAL | 1 refills | Status: DC

## 2019-06-24 MED ORDER — PANTOPRAZOLE SODIUM 40 MG PO TBEC: 40.0000 mg | DELAYED_RELEASE_TABLET | Freq: Every day | ORAL | 1 refills | Status: DC

## 2019-06-24 MED ORDER — LEVOTHYROXINE SODIUM 100 MCG PO TABS: 100.0000 ug | ORAL_TABLET | Freq: Every day | ORAL | 1 refills | Status: DC

## 2019-06-24 MED ORDER — METFORMIN HCL ER 500 MG PO TB24: ORAL_TABLET | ORAL | 3 refills | Status: DC

## 2019-06-24 NOTE — Assessment & Plan Note (Signed)
Chronic Check a1c Low sugar / carb diet Stressed regular exercise  

## 2019-06-24 NOTE — Assessment & Plan Note (Signed)
Chronic  Clinically euthyroid Check tsh  Titrate med dose if needed  

## 2019-06-24 NOTE — Assessment & Plan Note (Signed)
Chronic BP well controlled Current regimen effective and well tolerated Continue current medications at current doses cmp  

## 2019-06-24 NOTE — Assessment & Plan Note (Signed)
Chronic Check lipid panel  Continue daily statin Regular exercise and healthy diet encouraged  

## 2019-06-24 NOTE — Progress Notes (Signed)
Subjective:   [name] is a 69 y.o. male who presents for an Initial Medicare Annual Wellness Visit.  Review of Systems  NO ROS. Medicare Wellness Visit Cardiac Risk Factors include: advanced age (>25men, >35 women);diabetes mellitus;dyslipidemia;family history of premature cardiovascular disease;hypertension;male gender    Objective:    Today's Vitals   06/24/19 0828  BP: 128/80  Pulse: 69  Resp: 16  Temp: 98.4 F (36.9 C)  SpO2: 96%  Weight: 193 lb 12.8 oz (87.9 kg)  Height: 5\' 11"  (1.803 m)  PainSc: 0-No pain   Body mass index is 27.03 kg/m.  Advanced Directives 06/24/2019 04/05/2016 04/05/2016 03/24/2016  Does Patient Have a Medical Advance Directive? Yes Yes Yes No  Type of Advance Directive - [place];Living will [place];Living will -  Does patient want to make changes to medical advance directive? No - Patient declined No - Patient declined - -  Copy of [place] in Chart? - No - copy requested Yes -  Would patient like information on creating a medical advance directive? - No - Patient declined Yes (MAU/Ambulatory/Procedural Areas - Information given) Yes (MAU/Ambulatory/Procedural Areas - Information given)    Current Medications (verified) Outpatient Encounter Medications as of 06/24/2019  Medication Sig  . Ascorbic Acid (VITAMIN C WITH ROSE HIPS) 500 MG tablet Take 1,000 mg by mouth daily.  Marland Kitchen atorvastatin (LIPITOR) 10 MG tablet Take 1 tablet (10 mg total) by mouth daily.  . Azelastine HCl 0.15 % SOLN Place 1-2 sprays into both nostrils 2 (two) times daily.  . Azelastine-Fluticasone 137-50 MCG/ACT SUSP Place 1 spray into the nose 2 (two) times daily as needed.  . B Complex-C (SUPER B COMPLEX PO) Take 1 tablet by mouth daily.  . Calcium Carbonate-Vitamin D (CALCIUM-VITAMIN D3 PO) Take 1 tablet by mouth every other day.  . Cholecalciferol (VITAMIN D3) 1000 units CAPS Take 1,000 Units by mouth  daily.  . dorzolamide (TRUSOPT) 2 % ophthalmic solution Apply to eye.  . fluticasone (FLONASE) 50 MCG/ACT nasal spray Place 2 sprays into both nostrils daily.  . Garlic 2831 MG CAPS Take 1,000 mg by mouth every other day.  Marland Kitchen glucosamine-chondroitin 500-400 MG tablet Take 2 tablets by mouth daily.  Marland Kitchen levocetirizine (XYZAL) 5 MG tablet Take 10 mg by mouth 2 (two) times daily.  Marland Kitchen levothyroxine (SYNTHROID) 100 MCG tablet TAKE 1 TABLET (100 MCG TOTAL) BY MOUTH DAILY BEFORE BREAKFAST.  Marland Kitchen losartan-hydrochlorothiazide (HYZAAR) 100-12.5 MG tablet TAKE 1 TABLET BY MOUTH DAILY AFTER BREAKFAST.  . Magnesium 100 MG TABS Take 100 mg by mouth every other day.  . metFORMIN (GLUCOPHAGE-XR) 500 MG 24 hr tablet Take 1 tablet (500 mg) in the morning and 2 tablets (1000 mg) at night  . Multiple Vitamins-Minerals (MULTIVITAMIN ADULTS 50+ PO) Take 1 tablet by mouth daily.  . Omega-3 Fatty Acids (OMEGA 3 PO) Take 3 capsules by mouth daily.  . pantoprazole (PROTONIX) 40 MG tablet TAKE 1 TABLET BY MOUTH EVERY DAY  . vitamin E 400 UNIT capsule Take 400 Units by mouth every other day.   No facility-administered encounter medications on file as of 06/24/2019.    Allergies (verified) Morphine   History: Past Medical History:  Diagnosis Date  . Arthritis   . Diabetes mellitus without complication ([place])   . Diverticulitis of colon   . GERD (gastroesophageal reflux disease)   . Headache   . Hypertension   . Hypothyroidism   . Thyroid disease  Past Surgical History:  Procedure Laterality Date  . ANKLE FRACTURE SURGERY    . ANTERIOR CERVICAL DECOMP/DISCECTOMY FUSION N/A 04/05/2016   Procedure: C5-6, C6-7 Anterior Cervical Discectomy and Fusion, Allograft, Plate;  Surgeon: Marybelle Killings, MD;  Location: [facility];  Service: Orthopedics;  Laterality: N/A;  . ANTERIOR FUSION CERVICAL SPINE  04/05/2016   c6 c7  . CARPAL TUNNEL RELEASE Bilateral   . 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 JOINT REPLACEMENT   11/10   right knee replaced   . KNEE SURGERY    . LEFT HEART CATH AND CORONARY ANGIOGRAPHY N/A 02/06/2017   Procedure: LEFT HEART CATH AND CORONARY ANGIOGRAPHY;  Surgeon: Belva Crome, MD;  Location: [facility];  Service: Cardiovascular;  Laterality: N/A;  . LEG SURGERY  11/03   broken lower right leg crushed    Family History  Problem Relation Age of Onset  . Hypertension Mother   . Diabetes Mother   . Heart attack Father   . Heart disease Father   . Diabetes Paternal Grandmother   . Cancer Paternal Grandfather        of eye   Social History   Socioeconomic History  . Marital status: Divorced    Spouse name: Not on file  . Number of children: Not on file  . Years of education: Not on file  . Highest education level: Not on file  Occupational History  . Not on file  Tobacco Use  . Smoking status: Former Smoker    Types: Cigarettes    Quit date: 01/10/2008    Years since quitting: 11.4  . Smokeless tobacco: Former Systems developer    Types: Secondary school teacher  . Vaping Use: Never used  Substance and Sexual Activity  . Alcohol use: Yes    Comment: occasionally  . Drug use: No  . Sexual activity: Not on file  Other Topics Concern  . Not on file  Social History Narrative  . Not on file   Social Determinants of Health   Financial Resource Strain:   . Difficulty of Paying Living Expenses:   Food Insecurity:   . Worried About Charity fundraiser in the Last Year:   . Arboriculturist in the Last Year:   Transportation Needs:   . Film/video editor (Medical):   Marland Kitchen Lack of Transportation (Non-Medical):   Physical Activity:   . Days of Exercise per Week:   . Minutes of Exercise per Session:   Stress:   . Feeling of Stress :   Social Connections:   . Frequency of Communication with Friends and Family:   . Frequency of Social Gatherings with Friends and Family:   . Attends Religious Services:   . Active Member of Clubs or Organizations:   . Attends Theatre manager Meetings:   Marland Kitchen Marital Status:    Tobacco Counseling Counseling given: No   Clinical Intake:  Pre-visit preparation completed: Yes  Pain : No/denies pain Pain Score: 0-No pain     BMI - recorded: 27.03 Nutritional Status: BMI 25 -29 Overweight Nutritional Risks: None Diabetes: Yes CBG done?: No Did pt. bring in CBG monitor from home?: No  How often do you need to have someone help you when you read instructions, pamphlets, or other written materials from your doctor or pharmacy?: 1 - Never What is the last grade level you completed in school?: HSG; [place]  Interpreter Needed?: No  Information entered by :: Miakoda Mcmillion N. Amanada Philbrick, LPN  Activities of Daily Living In your present state of health, do you have any difficulty performing the following activities: 06/24/2019  Hearing? Y  Comment no hearing aids  Vision? N  Difficulty concentrating or making decisions? N  Walking or climbing stairs? N  Dressing or bathing? N  Doing errands, shopping? N  Preparing Food and eating ? N  Using the Toilet? N  In the past six months, have you accidently leaked urine? N  Do you have problems with loss of bowel control? N  Managing your Medications? N  Managing your Finances? N  Housekeeping or managing your Housekeeping? N  Some recent data might be hidden     Immunizations and Health Maintenance Immunization History  Administered Date(s) Administered  . Influenza, High Dose Seasonal PF 10/12/2017  . Influenza,inj,Quad PF,6+ Mos 10/11/2018  . Pneumococcal Conjugate-13 12/24/2018  . Pneumococcal Polysaccharide-23 11/12/2017  . Tdap 06/27/2017  . Zoster Recombinat (Shingrix) 12/27/2017, 03/28/2018   Health Maintenance Due  Topic Date Due  . COVID-19 Vaccine (1) Never done  . OPHTHALMOLOGY EXAM  11/20/2017  . HEMOGLOBIN A1C  06/24/2019    Patient Care Team: Binnie Rail, MD as PCP - General (Internal Medicine) Belva Crome, MD as PCP - Cardiology  (Cardiology)  Indicate any recent Medical Services you may have received from other than [facility] providers in the past year (date may be approximate).    Assessment:   This is a routine wellness examination for [place].  Hearing/Vision screen No exam data present  Dietary issues and exercise activities discussed: Current Exercise Habits: The patient has a physically strenuous job, but has no regular exercise apart from work., Exercise limited by: None identified  Goals    .  Client understands the importance of follow-up with providers by attending scheduled visits    .  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       Depression Screen PHQ 2/9 Scores 06/24/2019 12/24/2018 06/28/2018 06/22/2016  PHQ - 2 Score 0 0 0 0    Fall Risk Fall Risk  06/24/2019 12/24/2018 06/28/2018 06/22/2016  Falls in the past year? 0 0 0 No  Number falls in past yr: 0 0 0 -  Injury with Fall? 0 - 0 -  Risk for fall due to : No Fall Risks - - -  Follow up Falls evaluation completed - Falls evaluation completed -    Is the patient's home free of loose throw rugs in walkways, pet beds, electrical cords, etc?   yes      Grab bars in the bathroom? yes      Handrails on the stairs?   yes      Adequate lighting?   yes  Timed Get Up and Go performed: not indicated  Cognitive Function: Patient is cogitatively intact.      Screening Tests Health Maintenance  Topic Date Due  . COVID-19 Vaccine (1) Never done  . OPHTHALMOLOGY EXAM  11/20/2017  . HEMOGLOBIN A1C  06/24/2019  . FOOT EXAM  06/28/2019  . INFLUENZA VACCINE  08/10/2019  . COLONOSCOPY  08/27/2021  . TETANUS/TDAP  06/28/2027  . Hepatitis C Screening  Completed  . PNA vac Low Risk Adult  Completed    Qualifies for Shingles Vaccine?  completed  Cancer Screenings: Lung: Low Dose CT Chest recommended if Age 2-80 years, 30 pack-year currently smoking  OR have quit w/in  15years. Patient does qualify. Colorectal: Yes  Additional Screenings:  Hepatitis C Screening: completed      Plan:      I have personally reviewed and noted the following in the patient's chart:   . Medical and social history . Use of alcohol, tobacco or illicit drugs  . Current medications and supplements . Functional ability and status . Nutritional status . Physical activity . Advanced directives . List of other physicians . Hospitalizations, surgeries, and ER visits in previous 12 months . Vitals . Screenings to include cognitive, depression, and falls . Referrals and appointments  In addition, I have reviewed and discussed with patient certain preventive protocols, quality metrics, and best practice recommendations. A written personalized care plan for preventive services as well as general preventive health recommendations were provided to patient.     Azariah Bonura N. Lowell Guitar, [place]   [id number]/3838 Nurse Health Advisor

## 2019-06-24 NOTE — Patient Instructions (Addendum)
[name] , Thank you for taking time to come for your Medicare Wellness Visit. I appreciate your ongoing commitment to your health goals. Please review the following plan we discussed and let me know if I can assist you in the future.   Screening recommendations/referrals: Colonoscopy: last done 08/28/2011; due every 10 years;due 08/2021 Recommended yearly ophthalmology/optometry visit for glaucoma screening and checkup Recommended yearly dental visit for hygiene and checkup  Vaccinations: Influenza vaccine: 10/11/2018; due every year Pneumococcal vaccine: completed Tdap vaccine: 06/27/2017; due every 10 years; due 2029 Shingles vaccine: completed   Covid-19: completed  Advanced directives: Yes, copy on file.   Conditions/risks identified:  Yes  Next appointment:  Schedule your next appointment with health coach in 1 year.   Preventive Care 31 Years and Older, Male Preventive care refers to lifestyle choices and visits with your health care provider that can promote health and wellness. What does preventive care include?  A yearly physical exam. This is also called an annual well check.  Dental exams once or twice a year.  Routine eye exams. Ask your health care provider how often you should have your eyes checked.  Personal lifestyle choices, including:  Daily care of your teeth and gums.  Regular physical activity.  Eating a healthy diet.  Avoiding tobacco and drug use.  Limiting alcohol use.  Practicing safe sex.  Taking low doses of aspirin every day.  Taking vitamin and mineral supplements as recommended by your health care provider. What happens during an annual well check? The services and screenings done by your health care provider during your annual well check will depend on your age, overall health, lifestyle risk factors, and family history of disease. Counseling  Your health care provider may ask you questions about your:  Alcohol use.  Tobacco  use.  Drug use.  Emotional well-being.  Home and relationship well-being.  Sexual activity.  Eating habits.  History of falls.  Memory and ability to understand (cognition).  Work and work Statistician. Screening  You may have the following tests or measurements:  Height, weight, and BMI.  Blood pressure.  Lipid and cholesterol levels. These may be checked every 5 years, or more frequently if you are over 40 years old.  Skin check.  Lung cancer screening. You may have this screening every year starting at age 23 if you have a 30-pack-year history of smoking and currently smoke or have quit within the past 15 years.  Fecal occult blood test (FOBT) of the stool. You may have this test every year starting at age 46.  Flexible sigmoidoscopy or colonoscopy. You may have a sigmoidoscopy every 5 years or a colonoscopy every 10 years starting at age 23.  Prostate cancer screening. Recommendations will vary depending on your family history and other risks.  Hepatitis C blood test.  Hepatitis B blood test.  Sexually transmitted disease (STD) testing.  Diabetes screening. This is done by checking your blood sugar (glucose) after you have not eaten for a while (fasting). You may have this done every 1-3 years.  Abdominal aortic aneurysm (AAA) screening. You may need this if you are a current or former smoker.  Osteoporosis. You may be screened starting at age 29 if you are at high risk. Talk with your health care provider about your test results, treatment options, and if necessary, the need for more tests. Vaccines  Your health care provider may recommend certain vaccines, such as:  Influenza vaccine. This is recommended every year.  Tetanus,  diphtheria, and acellular pertussis (Tdap, Td) vaccine. You may need a Td booster every 10 years.  Zoster vaccine. You may need this after age 37.  Pneumococcal 13-valent conjugate (PCV13) vaccine. One dose is recommended after age  61.  Pneumococcal polysaccharide (PPSV23) vaccine. One dose is recommended after age 79. Talk to your health care provider about which screenings and vaccines you need and how often you need them. This information is not intended to replace advice given to you by your health care provider. Make sure you discuss any questions you have with your health care provider. Document Released: 01/22/2015 Document Revised: 09/15/2015 Document Reviewed: 10/27/2014 Elsevier Interactive Patient Education  2017 [place] Prevention in the Home Falls can cause injuries. They can happen to people of all ages. There are many things you can do to make your home safe and to help prevent falls. What can I do on the outside of my home?  Regularly fix the edges of walkways and driveways and fix any cracks.  Remove anything that might make you trip as you walk through a door, such as a raised step or threshold.  Trim any bushes or trees on the path to your home.  Use bright outdoor lighting.  Clear any walking paths of anything that might make someone trip, such as rocks or tools.  Regularly check to see if handrails are loose or broken. Make sure that both sides of any steps have handrails.  Any raised decks and porches should have guardrails on the edges.  Have any leaves, snow, or ice cleared regularly.  Use sand or salt on walking paths during winter.  Clean up any spills in your garage right away. This includes oil or grease spills. What can I do in the bathroom?  Use night lights.  Install grab bars by the toilet and in the tub and shower. Do not use towel bars as grab bars.  Use non-skid mats or decals in the tub or shower.  If you need to sit down in the shower, use a plastic, non-slip stool.  Keep the floor dry. Clean up any water that spills on the floor as soon as it happens.  Remove soap buildup in the tub or shower regularly.  Attach bath mats securely with double-sided  non-slip rug tape.  Do not have throw rugs and other things on the floor that can make you trip. What can I do in the bedroom?  Use night lights.  Make sure that you have a light by your bed that is easy to reach.  Do not use any sheets or blankets that are too big for your bed. They should not hang down onto the floor.  Have a firm chair that has side arms. You can use this for support while you get dressed.  Do not have throw rugs and other things on the floor that can make you trip. What can I do in the kitchen?  Clean up any spills right away.  Avoid walking on wet floors.  Keep items that you use a lot in easy-to-reach places.  If you need to reach something above you, use a strong step stool that has a grab bar.  Keep electrical cords out of the way.  Do not use floor polish or wax that makes floors slippery. If you must use wax, use non-skid floor wax.  Do not have throw rugs and other things on the floor that can make you trip. What can I do with  my stairs?  Do not leave any items on the stairs.  Make sure that there are handrails on both sides of the stairs and use them. Fix handrails that are broken or loose. Make sure that handrails are as long as the stairways.  Check any carpeting to make sure that it is firmly attached to the stairs. Fix any carpet that is loose or worn.  Avoid having throw rugs at the top or bottom of the stairs. If you do have throw rugs, attach them to the floor with carpet tape.  Make sure that you have a light switch at the top of the stairs and the bottom of the stairs. If you do not have them, ask someone to add them for you. What else can I do to help prevent falls?  Wear shoes that:  Do not have high heels.  Have rubber bottoms.  Are comfortable and fit you well.  Are closed at the toe. Do not wear sandals.  If you use a stepladder:  Make sure that it is fully opened. Do not climb a closed stepladder.  Make sure that both  sides of the stepladder are locked into place.  Ask someone to hold it for you, if possible.  Clearly mark and make sure that you can see:  Any grab bars or handrails.  First and last steps.  Where the edge of each step is.  Use tools that help you move around (mobility aids) if they are needed. These include:  Canes.  Walkers.  Scooters.  Crutches.  Turn on the lights when you go into a dark area. Replace any light bulbs as soon as they burn out.  Set up your furniture so you have a clear path. Avoid moving your furniture around.  If any of your floors are uneven, fix them.  If there are any pets around you, be aware of where they are.  Review your medicines with your doctor. Some medicines can make you feel dizzy. This can increase your chance of falling. Ask your doctor what other things that you can do to help prevent falls. This information is not intended to replace advice given to you by your health care provider. Make sure you discuss any questions you have with your health care provider. Document Released: 10/22/2008 Document Revised: 06/03/2015 Document Reviewed: 01/30/2014 Elsevier Interactive Patient Education  2017 [employer].

## 2019-06-24 NOTE — Assessment & Plan Note (Signed)
Chronic GERD controlled Continue daily medication Pantoprazole daily

## 2019-07-02 LAB — HM DIABETES EYE EXAM

## 2019-07-03 ENCOUNTER — Encounter: Payer: Self-pay | Admitting: Internal Medicine

## 2019-07-03 NOTE — Progress Notes (Signed)
Outside notes received. Information abstracted. Notes sent to scan.  

## 2019-07-07 DIAGNOSIS — M9903 Segmental and somatic dysfunction of lumbar region: Secondary | ICD-10-CM | POA: Diagnosis not present

## 2019-07-07 DIAGNOSIS — M9901 Segmental and somatic dysfunction of cervical region: Secondary | ICD-10-CM | POA: Diagnosis not present

## 2019-07-07 DIAGNOSIS — M5031 Other cervical disc degeneration,  high cervical region: Secondary | ICD-10-CM | POA: Diagnosis not present

## 2019-07-07 DIAGNOSIS — M5431 Sciatica, right side: Secondary | ICD-10-CM | POA: Diagnosis not present

## 2019-07-08 DIAGNOSIS — M5031 Other cervical disc degeneration,  high cervical region: Secondary | ICD-10-CM | POA: Diagnosis not present

## 2019-07-08 DIAGNOSIS — M9901 Segmental and somatic dysfunction of cervical region: Secondary | ICD-10-CM | POA: Diagnosis not present

## 2019-07-08 DIAGNOSIS — M9903 Segmental and somatic dysfunction of lumbar region: Secondary | ICD-10-CM | POA: Diagnosis not present

## 2019-07-08 DIAGNOSIS — M5431 Sciatica, right side: Secondary | ICD-10-CM | POA: Diagnosis not present

## 2019-07-17 DIAGNOSIS — M5031 Other cervical disc degeneration,  high cervical region: Secondary | ICD-10-CM | POA: Diagnosis not present

## 2019-07-17 DIAGNOSIS — M9901 Segmental and somatic dysfunction of cervical region: Secondary | ICD-10-CM | POA: Diagnosis not present

## 2019-07-17 DIAGNOSIS — M5431 Sciatica, right side: Secondary | ICD-10-CM | POA: Diagnosis not present

## 2019-07-17 DIAGNOSIS — M9903 Segmental and somatic dysfunction of lumbar region: Secondary | ICD-10-CM | POA: Diagnosis not present

## 2019-07-28 ENCOUNTER — Ambulatory Visit (INDEPENDENT_AMBULATORY_CARE_PROVIDER_SITE_OTHER): Payer: Medicare Other

## 2019-07-28 ENCOUNTER — Other Ambulatory Visit: Payer: Self-pay

## 2019-07-28 DIAGNOSIS — Z122 Encounter for screening for malignant neoplasm of respiratory organs: Secondary | ICD-10-CM

## 2019-07-28 DIAGNOSIS — Z87891 Personal history of nicotine dependence: Secondary | ICD-10-CM | POA: Diagnosis not present

## 2019-07-28 DIAGNOSIS — M9903 Segmental and somatic dysfunction of lumbar region: Secondary | ICD-10-CM | POA: Diagnosis not present

## 2019-07-28 DIAGNOSIS — M5031 Other cervical disc degeneration,  high cervical region: Secondary | ICD-10-CM | POA: Diagnosis not present

## 2019-07-28 DIAGNOSIS — M5431 Sciatica, right side: Secondary | ICD-10-CM | POA: Diagnosis not present

## 2019-07-28 DIAGNOSIS — M9901 Segmental and somatic dysfunction of cervical region: Secondary | ICD-10-CM | POA: Diagnosis not present

## 2019-07-29 NOTE — Progress Notes (Signed)
Please call patient and let them  know their  low dose Ct was read as a Lung RADS 2: nodules that are benign in appearance and behavior with a very low likelihood of becoming a clinically active cancer due to size or lack of growth. Recommendation per radiology is for a repeat LDCT in 12 months. .Please let them  know we will order and schedule their  annual screening scan for 07/2020. Please let them  know there was notation of CAD on their  scan.  Please remind the patient  that this is a non-gated exam therefore degree or severity of disease  cannot be determined. Please have them  follow up with their PCP regarding potential risk factor modification, dietary therapy or pharmacologic therapy if clinically indicated. Pt.  Is  currently on statin therapy. Please place order for annual  screening scan for 07/2020 and fax results to PCP. Thanks so much.

## 2019-07-30 ENCOUNTER — Encounter: Payer: Self-pay | Admitting: Internal Medicine

## 2019-07-30 NOTE — Progress Notes (Signed)
Outside notes received. Information abstracted. Notes sent to scan.  

## 2019-08-06 ENCOUNTER — Other Ambulatory Visit: Payer: Self-pay | Admitting: *Deleted

## 2019-08-06 DIAGNOSIS — Z87891 Personal history of nicotine dependence: Secondary | ICD-10-CM

## 2019-08-11 DIAGNOSIS — M9903 Segmental and somatic dysfunction of lumbar region: Secondary | ICD-10-CM | POA: Diagnosis not present

## 2019-08-11 DIAGNOSIS — M5031 Other cervical disc degeneration,  high cervical region: Secondary | ICD-10-CM | POA: Diagnosis not present

## 2019-08-11 DIAGNOSIS — M9901 Segmental and somatic dysfunction of cervical region: Secondary | ICD-10-CM | POA: Diagnosis not present

## 2019-08-11 DIAGNOSIS — M5431 Sciatica, right side: Secondary | ICD-10-CM | POA: Diagnosis not present

## 2019-08-18 DIAGNOSIS — M5431 Sciatica, right side: Secondary | ICD-10-CM | POA: Diagnosis not present

## 2019-08-18 DIAGNOSIS — M9903 Segmental and somatic dysfunction of lumbar region: Secondary | ICD-10-CM | POA: Diagnosis not present

## 2019-08-18 DIAGNOSIS — M9901 Segmental and somatic dysfunction of cervical region: Secondary | ICD-10-CM | POA: Diagnosis not present

## 2019-08-18 DIAGNOSIS — M5031 Other cervical disc degeneration,  high cervical region: Secondary | ICD-10-CM | POA: Diagnosis not present

## 2019-08-20 ENCOUNTER — Ambulatory Visit (INDEPENDENT_AMBULATORY_CARE_PROVIDER_SITE_OTHER): Payer: Medicare Other | Admitting: Family Medicine

## 2019-08-20 ENCOUNTER — Other Ambulatory Visit: Payer: Self-pay

## 2019-08-20 ENCOUNTER — Ambulatory Visit (INDEPENDENT_AMBULATORY_CARE_PROVIDER_SITE_OTHER): Payer: Medicare Other

## 2019-08-20 ENCOUNTER — Ambulatory Visit: Payer: Medicare Other | Admitting: Family Medicine

## 2019-08-20 ENCOUNTER — Encounter: Payer: Self-pay | Admitting: Family Medicine

## 2019-08-20 VITALS — BP 118/70 | HR 61 | Ht 71.0 in | Wt 198.0 lb

## 2019-08-20 DIAGNOSIS — M65341 Trigger finger, right ring finger: Secondary | ICD-10-CM

## 2019-08-20 DIAGNOSIS — M79641 Pain in right hand: Secondary | ICD-10-CM

## 2019-08-20 NOTE — Progress Notes (Signed)
[name] Phone: [phone] Subjective:   [name], am serving as a scribe for Dr. Hulan Saas. This visit occurred during the SARS-CoV-2 public health emergency.  Safety protocols were in place, including screening questions prior to the visit, additional usage of staff PPE, and extensive cleaning of exam room while observing appropriate contact time as indicated for disinfecting solutions.   I'm seeing this patient by the request  of:  Binnie Rail, MD  CC: Hand pain  [facility]:[id number]  [name] is a 69 y.o. male coming in with complaint of R hand pain feels like it was hit with a hammer. Is unable to make a fist as the ring finger will not bend all the way down. Has had carpal tunnel surgery in the past on both hands.  Onset- X90month Location- R hand below ring finger on palm Character- feels like it was hit by a hammer Aggravating factors- gripping things, does yard work hard to use the yard tools  Reliving factors- keeping hand open Therapies tried- ibuprofen goodies powder      Past Medical History:  Diagnosis Date  . Arthritis   . Diabetes mellitus without complication ([place])   . Diverticulitis of colon   . GERD (gastroesophageal reflux disease)   . Headache   . Hypertension   . Hypothyroidism   . Thyroid disease    Past Surgical History:  Procedure Laterality Date  . ANKLE FRACTURE SURGERY    . ANTERIOR CERVICAL DECOMP/DISCECTOMY FUSION N/A 04/05/2016   Procedure: C5-6, C6-7 Anterior Cervical Discectomy and Fusion, Allograft, Plate;  Surgeon: Marybelle Killings, MD;  Location: [facility];  Service: Orthopedics;  Laterality: N/A;  . ANTERIOR FUSION CERVICAL SPINE  04/05/2016   c6 c7  . CARPAL TUNNEL RELEASE Bilateral   . 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 JOINT REPLACEMENT  11/10   right knee replaced   . KNEE SURGERY    . LEFT HEART CATH AND CORONARY ANGIOGRAPHY N/A  02/06/2017   Procedure: LEFT HEART CATH AND CORONARY ANGIOGRAPHY;  Surgeon: Belva Crome, MD;  Location: [facility];  Service: Cardiovascular;  Laterality: N/A;  . LEG SURGERY  11/03   broken lower right leg crushed    Social History   Socioeconomic History  . Marital status: Divorced    Spouse name: Not on file  . Number of children: Not on file  . Years of education: Not on file  . Highest education level: Not on file  Occupational History  . Not on file  Tobacco Use  . Smoking status: Former Smoker    Types: Cigarettes    Quit date: 01/10/2008    Years since quitting: 11.6  . Smokeless tobacco: Former Systems developer    Types: Secondary school teacher  . Vaping Use: Never used  Substance and Sexual Activity  . Alcohol use: Yes    Comment: occasionally  . Drug use: No  . Sexual activity: Not on file  Other Topics Concern  . Not on file  Social History Narrative  . Not on file   Social Determinants of Health   Financial Resource Strain:   . Difficulty of Paying Living Expenses:   Food Insecurity:   . Worried About Charity fundraiser in the Last Year:   . Arboriculturist in the Last Year:   Transportation Needs:   . Lack of Transportation (  Medical):   Marland Kitchen Lack of Transportation (Non-Medical):   Physical Activity:   . Days of Exercise per Week:   . Minutes of Exercise per Session:   Stress:   . Feeling of Stress :   Social Connections:   . Frequency of Communication with Friends and Family:   . Frequency of Social Gatherings with Friends and Family:   . Attends Religious Services:   . Active Member of Clubs or Organizations:   . Attends Archivist Meetings:   Marland Kitchen Marital Status:    Allergies  Allergen Reactions  . Morphine Other (See Comments)    Severe headache Severe headache Severe headache Severe headache   Family History  Problem Relation Age of Onset  . Hypertension Mother   . Diabetes Mother   . Heart attack Father   . Heart disease Father   .  Diabetes Paternal Grandmother   . Cancer Paternal Grandfather        of eye    Current Outpatient Medications (Endocrine & Metabolic):  .  levothyroxine (SYNTHROID) 100 MCG tablet, Take 1 tablet (100 mcg total) by mouth daily before breakfast. .  metFORMIN (GLUCOPHAGE-XR) 500 MG 24 hr tablet, Take 1 tablet (500 mg) in the morning and 2 tablets (1000 mg) at night  Current Outpatient Medications (Cardiovascular):  .  atorvastatin (LIPITOR) 10 MG tablet, Take 1 tablet (10 mg total) by mouth daily. Marland Kitchen  losartan-hydrochlorothiazide (HYZAAR) 100-12.5 MG tablet, Take 1 tablet by mouth daily after breakfast.  Current Outpatient Medications (Respiratory):  Marland Kitchen  Azelastine HCl 0.15 % SOLN, Place 1-2 sprays into both nostrils 2 (two) times daily. .  Azelastine-Fluticasone 137-50 MCG/ACT SUSP, Place 1 spray into the nose 2 (two) times daily as needed. .  fluticasone (FLONASE) 50 MCG/ACT nasal spray, Place 2 sprays into both nostrils daily. Marland Kitchen  levocetirizine (XYZAL) 5 MG tablet, Take 10 mg by mouth 2 (two) times daily.    Current Outpatient Medications (Other):  Marland Kitchen  Ascorbic Acid (VITAMIN C WITH ROSE HIPS) 500 MG tablet, Take 1,000 mg by mouth daily. .  B Complex-C (SUPER B COMPLEX PO), Take 1 tablet by mouth daily. .  Calcium Carbonate-Vitamin D (CALCIUM-VITAMIN D3 PO), Take 1 tablet by mouth every other day. .  Cholecalciferol (VITAMIN D3) 1000 units CAPS, Take 1,000 Units by mouth daily. .  dorzolamide (TRUSOPT) 2 % ophthalmic solution, Apply to eye. .  Garlic 6948 MG CAPS, Take 1,000 mg by mouth every other day. Marland Kitchen  glucosamine-chondroitin 500-400 MG tablet, Take 2 tablets by mouth daily. .  Magnesium 100 MG TABS, Take 100 mg by mouth every other day. .  Multiple Vitamins-Minerals (MULTIVITAMIN ADULTS 50+ PO), Take 1 tablet by mouth daily. .  Omega-3 Fatty Acids (OMEGA 3 PO), Take 3 capsules by mouth daily. .  pantoprazole (PROTONIX) 40 MG tablet, Take 1 tablet (40 mg total) by mouth daily. .   vitamin E 400 UNIT capsule, Take 400 Units by mouth every other day.   Reviewed prior external information including notes and imaging from  primary care provider As well as notes that were available from care everywhere and other healthcare systems.  Past medical history, social, surgical and family history all reviewed in electronic medical record.  No pertanent information unless stated regarding to the chief complaint.   Review of Systems:  No headache, visual changes, nausea, vomiting, diarrhea, constipation, dizziness, abdominal pain, skin rash, fevers, chills, night sweats, weight loss, swollen lymph nodes, body aches, joint swelling, chest pain, shortness  of breath, mood changes. POSITIVE muscle aches  Objective  Blood pressure 118/70, pulse 61, height 5\' 11"  (1.803 m), weight 198 lb (89.8 kg), SpO2 97 %.   General: No apparent distress alert and oriented x3 mood and affect normal, dressed appropriately.  HEENT: Pupils equal, extraocular movements intact  Respiratory: Patient's speak in full sentences and does not appear short of breath  Cardiovascular: No lower extremity edema, non tender, no erythema  Neuro: Cranial nerves II through XII are intact, neurovascularly intact in all extremities with 2+ DTRs and 2+ pulses.  Gait normal with good balance and coordination.  MSK: Mild to moderate arthritic changes of multiple joints. Hand exam shows the patient does have a nodule noted on the A2 pulley of the ring finger.  Triggering noted.  Neurovascularly intact distally.  Good capillary refill.  Procedure: Real-time Ultrasound Guided Injection of right flexor tendon sheath of ring finger Device: GE Logiq Q7 Ultrasound guided injection is preferred based studies that show increased duration, increased effect, greater accuracy, decreased procedural pain, increased response rate, and decreased cost with ultrasound guided versus blind injection.  Verbal informed consent obtained.    Time-out conducted.  Noted no overlying erythema, induration, or other signs of local infection.  Skin prepped in a sterile fashion.  Local anesthesia: Topical Ethyl chloride.  With sterile technique and under real time ultrasound guidance: With a 25-gauge half inch needle injected with 0.5 cc of 0.5% Marcaine and 0.5 cc of Kenalog 40 mg/mL Completed without difficulty  Pain immediately resolved suggesting accurate placement of the medication.  Advised to call if fevers/chills, erythema, induration, drainage, or persistent bleeding.  Images permanently stored and available for review in the ultrasound unit.  Impression: Technically successful ultrasound guided injection.   Impression and Recommendations:     The above documentation has been reviewed and is accurate and complete Lyndal Pulley, DO       Note: This dictation was prepared with Dragon dictation along with smaller phrase technology. Any transcriptional errors that result from this process are unintentional.

## 2019-08-20 NOTE — Patient Instructions (Signed)
Splint at night Injected hand today See me in 6 weeks

## 2019-08-20 NOTE — Assessment & Plan Note (Signed)
Patient given injection today and tolerated the procedure well.  Discussed icing regimen and home exercise, which activities to do which wants to avoid.  We discussed bracing at night.  Discussed the prognosis of this and potentially coming back.  Patient has had carpal tunnel surgery previously and understands what hand surgery would entail.  Patient to 1 still course avoided if possible.  Follow-up with me again in 4 to 8 weeks

## 2019-09-01 DIAGNOSIS — M5431 Sciatica, right side: Secondary | ICD-10-CM | POA: Diagnosis not present

## 2019-09-01 DIAGNOSIS — M9901 Segmental and somatic dysfunction of cervical region: Secondary | ICD-10-CM | POA: Diagnosis not present

## 2019-09-01 DIAGNOSIS — M5031 Other cervical disc degeneration,  high cervical region: Secondary | ICD-10-CM | POA: Diagnosis not present

## 2019-09-01 DIAGNOSIS — H402212 Chronic angle-closure glaucoma, right eye, moderate stage: Secondary | ICD-10-CM | POA: Diagnosis not present

## 2019-09-01 DIAGNOSIS — H402221 Chronic angle-closure glaucoma, left eye, mild stage: Secondary | ICD-10-CM | POA: Diagnosis not present

## 2019-09-01 DIAGNOSIS — M9903 Segmental and somatic dysfunction of lumbar region: Secondary | ICD-10-CM | POA: Diagnosis not present

## 2019-09-02 DIAGNOSIS — H4020X2 Unspecified primary angle-closure glaucoma, moderate stage: Secondary | ICD-10-CM | POA: Diagnosis not present

## 2019-09-02 DIAGNOSIS — H2513 Age-related nuclear cataract, bilateral: Secondary | ICD-10-CM | POA: Diagnosis not present

## 2019-09-02 DIAGNOSIS — H43813 Vitreous degeneration, bilateral: Secondary | ICD-10-CM | POA: Diagnosis not present

## 2019-09-02 DIAGNOSIS — E119 Type 2 diabetes mellitus without complications: Secondary | ICD-10-CM | POA: Diagnosis not present

## 2019-09-05 ENCOUNTER — Telehealth: Payer: Self-pay | Admitting: Internal Medicine

## 2019-09-05 NOTE — Telephone Encounter (Signed)
Called and spoke with patient's wife today and told her info had been received.

## 2019-09-05 NOTE — Telephone Encounter (Signed)
Patient wanted to make sure Dr.Burns received his diabetic eye exam report. If not he can call and have them fax it again.

## 2019-09-06 ENCOUNTER — Other Ambulatory Visit: Payer: Self-pay | Admitting: Internal Medicine

## 2019-09-16 ENCOUNTER — Other Ambulatory Visit: Payer: Self-pay

## 2019-09-16 ENCOUNTER — Encounter: Payer: Self-pay | Admitting: Allergy and Immunology

## 2019-09-16 ENCOUNTER — Ambulatory Visit (INDEPENDENT_AMBULATORY_CARE_PROVIDER_SITE_OTHER): Payer: Medicare Other | Admitting: Allergy and Immunology

## 2019-09-16 DIAGNOSIS — R05 Cough: Secondary | ICD-10-CM

## 2019-09-16 DIAGNOSIS — J3089 Other allergic rhinitis: Secondary | ICD-10-CM

## 2019-09-16 DIAGNOSIS — H1013 Acute atopic conjunctivitis, bilateral: Secondary | ICD-10-CM | POA: Diagnosis not present

## 2019-09-16 DIAGNOSIS — R059 Cough, unspecified: Secondary | ICD-10-CM

## 2019-09-16 MED ORDER — AZELASTINE HCL 0.15 % NA SOLN: 1.0000 | Freq: Two times a day (BID) | NASAL | 5 refills | Status: DC | PRN

## 2019-09-16 NOTE — Assessment & Plan Note (Signed)
   Continue appropriate aeroallergen avoidance measures.  A refill prescription has been provided for azelastine nasal spray, 1-2 sprays per nostril twice daily as needed.   Nasal saline lavage (NeilMed) has been recommended as needed and prior to medicated nasal sprays along with instructions for proper administration.  While outdoors and mowing lawns, I have recommended having nasal saline spray (i.e. Simply Saline) on hand for frequent as needed use.  For thick post nasal drainage, add guaifenesin 1200 mg (Mucinex Maximum Strength)  twice daily as needed with adequate hydration as discussed.  If allergen avoidance measures and medications fail to adequately relieve symptoms, aeroallergen immunotherapy will be considered.

## 2019-09-16 NOTE — Patient Instructions (Addendum)
Perennial allergic rhinitis with possible nonallergic component  Continue appropriate aeroallergen avoidance measures.  A refill prescription has been provided for azelastine nasal spray, 1-2 sprays per nostril twice daily as needed.   Nasal saline lavage (NeilMed) has been recommended as needed and prior to medicated nasal sprays along with instructions for proper administration.  While outdoors and mowing lawns, I have recommended having nasal saline spray (i.e. Simply Saline) on hand for frequent as needed use.  For thick post nasal drainage, add guaifenesin 1200 mg (Mucinex Maximum Strength)  twice daily as needed with adequate hydration as discussed.  If allergen avoidance measures and medications fail to adequately relieve symptoms, aeroallergen immunotherapy will be considered.  Allergic conjunctivitis  Treatment plan as outlined above for allergic rhinitis.  I have also recommended frequent use of eye lubricant drops (i.e., Natural Tears) as needed.  Cough The patient's history suggests upper airway cough syndrome.    Treatment plan as outlined above.  If the coughing recurs, persists, or progresses despite this plan, we will evaluate further.   Return in about 5 months (around 02/16/2020), or if symptoms worsen or fail to improve.

## 2019-09-16 NOTE — Assessment & Plan Note (Signed)
The patient's history suggests upper airway cough syndrome.    Treatment plan as outlined above.  If the coughing recurs, persists, or progresses despite this plan, we will evaluate further.

## 2019-09-16 NOTE — Progress Notes (Signed)
Follow-up Note  RE: [name] MRN: [id number] DOB: 20-Mar-1950 Date of Office Visit: 09/16/2019  Primary care provider: Binnie Rail, MD Referring provider: Binnie Rail, MD  History of present illness: [name] is a 69 y.o. male with allergic rhinoconjunctivitis and history of cough presenting today for follow-up.  He was previously seen in this clinic on 05/13/2019.  He reports that he was unable to start azelastine nasal spray because of glaucoma surgery.  However, at his last visit with his ophthalmologist he was told that he could start the prescription nasal spray.  He has been using nasal saline spray and natural Tears throughout the day as needed with moderate relief.  He reports that he has stopped using his CPAP because of nasal congestion at nighttime while wearing the CPAP.  Assessment and plan: Perennial allergic rhinitis with possible nonallergic component  Continue appropriate aeroallergen avoidance measures.  A refill prescription has been provided for azelastine nasal spray, 1-2 sprays per nostril twice daily as needed.   Nasal saline lavage (NeilMed) has been recommended as needed and prior to medicated nasal sprays along with instructions for proper administration.  While outdoors and mowing lawns, I have recommended having nasal saline spray (i.e. Simply Saline) on hand for frequent as needed use.  For thick post nasal drainage, add guaifenesin 1200 mg (Mucinex Maximum Strength)  twice daily as needed with adequate hydration as discussed.  If allergen avoidance measures and medications fail to adequately relieve symptoms, aeroallergen immunotherapy will be considered.  Allergic conjunctivitis  Treatment plan as outlined above for allergic rhinitis.  I have also recommended frequent use of eye lubricant drops (i.e., Natural Tears) as needed.  History of cough The patient's history suggests upper airway cough syndrome.    Treatment plan as  outlined above.  If the coughing recurs, persists, or progresses despite this plan, we will evaluate further.   Meds ordered this encounter  Medications  . Azelastine HCl 0.15 % SOLN    Sig: Place 1-2 sprays into both nostrils 2 (two) times daily as needed.    Dispense:  30 mL    Refill:  5    Physical examination: Blood pressure 110/76, pulse 64, temperature 98 F (36.7 C), temperature source Oral, resp. rate 16, SpO2 98 %.  General: Alert, interactive, in no acute distress. HEENT: TMs pearly gray, turbinates moderately edematous without discharge, post-pharynx mildly erythematous. Neck: Supple without lymphadenopathy. Lungs: Clear to auscultation without wheezing, rhonchi or rales. CV: Normal S1, S2 without murmurs. Skin: Warm and dry, without lesions or rashes.  The following portions of the patient's history were reviewed and updated as appropriate: allergies, current medications, past family history, past medical history, past social history, past surgical history and problem list.  Current Outpatient Medications  Medication Sig Dispense Refill  . Ascorbic Acid (VITAMIN C WITH ROSE HIPS) 500 MG tablet Take 1,000 mg by mouth daily.    Marland Kitchen atorvastatin (LIPITOR) 10 MG tablet Take 1 tablet by mouth once daily 90 tablet 0  . B Complex-C (SUPER B COMPLEX PO) Take 1 tablet by mouth daily.    . Calcium Carbonate-Vitamin D (CALCIUM-VITAMIN D3 PO) Take 1 tablet by mouth every other day.    . Cholecalciferol (VITAMIN D3) 1000 units CAPS Take 1,000 Units by mouth daily.    . Garlic 3419 MG CAPS Take 1,000 mg by mouth every other day.    Marland Kitchen glucosamine-chondroitin 500-400 MG tablet Take 2 tablets by mouth daily.    Marland Kitchen  levocetirizine (XYZAL) 5 MG tablet Take 10 mg by mouth 2 (two) times daily.    Marland Kitchen levothyroxine (SYNTHROID) 100 MCG tablet Take 1 tablet (100 mcg total) by mouth daily before breakfast. 90 tablet 1  . losartan-hydrochlorothiazide (HYZAAR) 100-12.5 MG tablet Take 1 tablet by  mouth daily after breakfast. 90 tablet 1  . Magnesium 100 MG TABS Take 100 mg by mouth every other day.    . metFORMIN (GLUCOPHAGE-XR) 500 MG 24 hr tablet Take 1 tablet (500 mg) in the morning and 2 tablets (1000 mg) at night 270 tablet 3  . Multiple Vitamins-Minerals (MULTIVITAMIN ADULTS 50+ PO) Take 1 tablet by mouth daily.    . Omega-3 Fatty Acids (OMEGA 3 PO) Take 3 capsules by mouth daily.    . pantoprazole (PROTONIX) 40 MG tablet Take 1 tablet (40 mg total) by mouth daily. 90 tablet 1  . vitamin E 400 UNIT capsule Take 400 Units by mouth every other day.    . Azelastine HCl 0.15 % SOLN Place 1-2 sprays into both nostrils 2 (two) times daily as needed. 30 mL 5  . Azelastine-Fluticasone 137-50 MCG/ACT SUSP Place 1 spray into the nose 2 (two) times daily as needed. (Patient not taking: Reported on 09/16/2019) 23 g 2  . dorzolamide (TRUSOPT) 2 % ophthalmic solution Apply to eye. (Patient not taking: Reported on 09/16/2019)    . fluticasone (FLONASE) 50 MCG/ACT nasal spray Place 2 sprays into both nostrils daily. (Patient not taking: Reported on 09/16/2019) 1 g 5   No current facility-administered medications for this visit.    Allergies  Allergen Reactions  . Morphine Other (See Comments)    Severe headache Severe headache Severe headache Severe headache    I appreciate the opportunity to take part in Demarrius's care. Please do not hesitate to contact me with questions.  Sincerely,   R. Edgar Frisk, MD

## 2019-09-16 NOTE — Assessment & Plan Note (Signed)
   Treatment plan as outlined above for allergic rhinitis.  I have also recommended frequent use of eye lubricant drops (i.e., Natural Tears) as needed.

## 2019-09-23 DIAGNOSIS — M5031 Other cervical disc degeneration,  high cervical region: Secondary | ICD-10-CM | POA: Diagnosis not present

## 2019-09-23 DIAGNOSIS — M9901 Segmental and somatic dysfunction of cervical region: Secondary | ICD-10-CM | POA: Diagnosis not present

## 2019-09-23 DIAGNOSIS — M9903 Segmental and somatic dysfunction of lumbar region: Secondary | ICD-10-CM | POA: Diagnosis not present

## 2019-09-23 DIAGNOSIS — M5431 Sciatica, right side: Secondary | ICD-10-CM | POA: Diagnosis not present

## 2019-09-29 DIAGNOSIS — H402212 Chronic angle-closure glaucoma, right eye, moderate stage: Secondary | ICD-10-CM | POA: Diagnosis not present

## 2019-09-29 DIAGNOSIS — H402221 Chronic angle-closure glaucoma, left eye, mild stage: Secondary | ICD-10-CM | POA: Diagnosis not present

## 2019-10-07 DIAGNOSIS — M9903 Segmental and somatic dysfunction of lumbar region: Secondary | ICD-10-CM | POA: Diagnosis not present

## 2019-10-07 DIAGNOSIS — M9901 Segmental and somatic dysfunction of cervical region: Secondary | ICD-10-CM | POA: Diagnosis not present

## 2019-10-07 DIAGNOSIS — M5031 Other cervical disc degeneration,  high cervical region: Secondary | ICD-10-CM | POA: Diagnosis not present

## 2019-10-07 DIAGNOSIS — M5431 Sciatica, right side: Secondary | ICD-10-CM | POA: Diagnosis not present

## 2019-10-13 ENCOUNTER — Ambulatory Visit: Payer: Medicare Other | Admitting: Family Medicine

## 2019-10-21 DIAGNOSIS — M5431 Sciatica, right side: Secondary | ICD-10-CM | POA: Diagnosis not present

## 2019-10-21 DIAGNOSIS — M5031 Other cervical disc degeneration,  high cervical region: Secondary | ICD-10-CM | POA: Diagnosis not present

## 2019-10-21 DIAGNOSIS — M9903 Segmental and somatic dysfunction of lumbar region: Secondary | ICD-10-CM | POA: Diagnosis not present

## 2019-10-21 DIAGNOSIS — M9901 Segmental and somatic dysfunction of cervical region: Secondary | ICD-10-CM | POA: Diagnosis not present

## 2019-11-06 ENCOUNTER — Ambulatory Visit (HOSPITAL_COMMUNITY)
Admission: RE | Admit: 2019-11-06 | Discharge: 2019-11-06 | Disposition: A | Discharge status: Home / Self Care | Payer: Medicare Other | Source: Ambulatory Visit | Attending: Family Medicine | Admitting: Family Medicine

## 2019-11-06 ENCOUNTER — Encounter (HOSPITAL_COMMUNITY): Payer: Self-pay

## 2019-11-06 ENCOUNTER — Other Ambulatory Visit: Payer: Self-pay

## 2019-11-06 VITALS — BP 110/66 | HR 71 | Temp 97.9°F | Resp 19

## 2019-11-06 DIAGNOSIS — J0110 Acute frontal sinusitis, unspecified: Secondary | ICD-10-CM

## 2019-11-06 MED ORDER — CEFDINIR 300 MG PO CAPS
300.0000 mg | ORAL_CAPSULE | Freq: Two times a day (BID) | ORAL | 0 refills | Status: DC
Start: 2019-11-06 — End: 2019-12-21

## 2019-11-06 NOTE — ED Triage Notes (Signed)
Pt is here with a cough that started 2 weeks ago, pt has taken OTC meds to relieve discomfort.

## 2019-11-06 NOTE — ED Provider Notes (Signed)
[facility]   [id number] 11/06/19 Arrival Time: 1839  ASSESSMENT & PLAN:  1. Acute non-recurrent frontal sinusitis    Begin: Meds ordered this encounter  Medications  . cefdinir (OMNICEF) 300 MG capsule    Sig: Take 1 capsule (300 mg total) by mouth 2 (two) times daily.    Dispense:  20 capsule    Refill:  0    Discussed typical duration of symptoms. OTC symptom care as needed. Ensure adequate fluid intake and rest.   Follow-up Information    Burns, Claudina Lick, MD.   Specialty: Internal Medicine Why: If worsening or failing to improve as anticipated. Contact information: [place] [address] [phone]               Reviewed expectations re: course of current medical issues. Questions answered. Outlined signs and symptoms indicating need for more acute intervention. Patient verbalized understanding. After Visit Summary given.   SUBJECTIVE: History from: patient.  [name] is a 69 y.o. male who presents with complaint of nasal congestion, post-nasal drainage, and sinus pain. Onset gradual, over one w ago. Respiratory symptoms: occasional coughing; no SOB/wheezing. Fever: absent. Overall normal PO intake without n/v. OTC treatment: none.   History of frequent sinus infections: no. No specific aggravating or alleviating factors reported. Social History   Tobacco Use  Smoking Status Former Smoker  . Types: Cigarettes  . Quit date: 01/10/2008  . Years since quitting: 11.8  Smokeless Tobacco Former Systems developer  . Types: Chew     OBJECTIVE:  General appearance: alert; no distress HEENT: nasal congestion; clear runny nose; throat irritation secondary to post-nasal drainage; bilateral frontal tenderness to palpation; turbinates boggy Neck: supple without LAD; trachea midline Lungs: unlabored respirations, symmetrical air entry; cough: mild and dry; no respiratory distress Skin: warm and dry Psychological: alert and  cooperative; normal mood and affect  Allergies  Allergen Reactions  . Morphine Other (See Comments)    Severe headache Severe headache Severe headache Severe headache    Past Medical History:  Diagnosis Date  . Arthritis   . Diabetes mellitus without complication ([place])   . Diverticulitis of colon   . GERD (gastroesophageal reflux disease)   . Headache   . Hypertension   . Hypothyroidism   . Thyroid disease    Family History  Problem Relation Age of Onset  . Hypertension Mother   . Diabetes Mother   . Heart attack Father   . Heart disease Father   . Diabetes Paternal Grandmother   . Cancer Paternal Grandfather        of eye   Social History   Socioeconomic History  . Marital status: Divorced    Spouse name: Not on file  . Number of children: Not on file  . Years of education: Not on file  . Highest education level: Not on file  Occupational History  . Not on file  Tobacco Use  . Smoking status: Former Smoker    Types: Cigarettes    Quit date: 01/10/2008    Years since quitting: 11.8  . Smokeless tobacco: Former Systems developer    Types: Secondary school teacher  . Vaping Use: Never used  Substance and Sexual Activity  . Alcohol use: Yes    Comment: occasionally  . Drug use: No  . Sexual activity: Yes    Birth control/protection: None    Comment: Married  Other Topics Concern  . Not on file  Social History Narrative  . Not  on file   Social Determinants of Health   Financial Resource Strain:   . Difficulty of Paying Living Expenses: Not on file  Food Insecurity:   . Worried About Charity fundraiser in the Last Year: Not on file  . Ran Out of Food in the Last Year: Not on file  Transportation Needs:   . Lack of Transportation (Medical): Not on file  . Lack of Transportation (Non-Medical): Not on file  Physical Activity:   . Days of Exercise per Week: Not on file  . Minutes of Exercise per Session: Not on file  Stress:   . Feeling of Stress : Not on file  Social  Connections:   . Frequency of Communication with Friends and Family: Not on file  . Frequency of Social Gatherings with Friends and Family: Not on file  . Attends Religious Services: Not on file  . Active Member of Clubs or Organizations: Not on file  . Attends Archivist Meetings: Not on file  . Marital Status: Not on file  Intimate Partner Violence:   . Fear of Current or Ex-Partner: Not on file  . Emotionally Abused: Not on file  . Physically Abused: Not on file  . Sexually Abused: Not on file            Vanessa Kick, MD 11/06/19 1924

## 2019-11-10 DIAGNOSIS — M9901 Segmental and somatic dysfunction of cervical region: Secondary | ICD-10-CM | POA: Diagnosis not present

## 2019-11-10 DIAGNOSIS — M5431 Sciatica, right side: Secondary | ICD-10-CM | POA: Diagnosis not present

## 2019-11-10 DIAGNOSIS — M9903 Segmental and somatic dysfunction of lumbar region: Secondary | ICD-10-CM | POA: Diagnosis not present

## 2019-11-10 DIAGNOSIS — M5031 Other cervical disc degeneration,  high cervical region: Secondary | ICD-10-CM | POA: Diagnosis not present

## 2019-11-24 DIAGNOSIS — M5031 Other cervical disc degeneration,  high cervical region: Secondary | ICD-10-CM | POA: Diagnosis not present

## 2019-11-24 DIAGNOSIS — M9903 Segmental and somatic dysfunction of lumbar region: Secondary | ICD-10-CM | POA: Diagnosis not present

## 2019-11-24 DIAGNOSIS — M9901 Segmental and somatic dysfunction of cervical region: Secondary | ICD-10-CM | POA: Diagnosis not present

## 2019-11-24 DIAGNOSIS — M5431 Sciatica, right side: Secondary | ICD-10-CM | POA: Diagnosis not present

## 2019-11-25 ENCOUNTER — Telehealth: Payer: Self-pay | Admitting: Family

## 2019-11-25 NOTE — Telephone Encounter (Signed)
Made in error

## 2019-11-30 ENCOUNTER — Other Ambulatory Visit: Payer: Self-pay | Admitting: Internal Medicine

## 2019-12-08 DIAGNOSIS — M9903 Segmental and somatic dysfunction of lumbar region: Secondary | ICD-10-CM | POA: Diagnosis not present

## 2019-12-08 DIAGNOSIS — M9901 Segmental and somatic dysfunction of cervical region: Secondary | ICD-10-CM | POA: Diagnosis not present

## 2019-12-08 DIAGNOSIS — M5431 Sciatica, right side: Secondary | ICD-10-CM | POA: Diagnosis not present

## 2019-12-08 DIAGNOSIS — M5031 Other cervical disc degeneration,  high cervical region: Secondary | ICD-10-CM | POA: Diagnosis not present

## 2019-12-21 NOTE — Progress Notes (Signed)
Subjective:    Patient ID: [name], male    DOB: 07-22-1950, 69 y.o.   MRN: [id number]  HPI The patient is here for follow up of their chronic medical problems, including htn, dm, hyperlipidemia, hypothyroidism, gerd, gout  He is taking all of his medications as prescribed.    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  He has been taking 4 ibuprofen twice daily and it helps.    He can not use his cpap because of his sinuses.  He gets infections and has headaches and dizziness.  He did have to have an antibiotic and that did help with the pain.    Medications and allergies reviewed with patient and updated if appropriate.  Patient Active Problem List   Diagnosis Date Noted  . Acquired trigger finger of right ring finger 08/20/2019  . Environmental allergies 01/13/2019  . Cough 12/17/2018  . Allergic conjunctivitis 12/17/2018  . Chronic frontal sinusitis 12/17/2018  . Perennial allergic rhinitis with possible nonallergic component 11/13/2018  . Eosinophilic leukocytosis 37/85/8850  . Gout 06/28/2018  . Hallux rigidus of right foot 08/30/2017  . Hypertension 06/26/2017  . OSA (obstructive sleep apnea) 06/26/2017  . Tobacco abuse, in remission 06/26/2017  . Type 2 diabetes mellitus with complication, without long-term current use of insulin ([place]) 06/22/2016  . Hypothyroidism 06/22/2016  . Hyperlipidemia 06/22/2016  . GERD (gastroesophageal reflux disease) 06/22/2016  . Status post cervical spinal fusion 05/23/2016  . Cervical spinal stenosis 04/05/2016  . Pain in right wrist 03/07/2016  . Neck pain 12/01/2015  . Other spondylosis with radiculopathy, cervical region 12/01/2015  . Diverticulitis of colon with perforation 10/20/2010    Current Outpatient Medications on File Prior to Visit  Medication Sig Dispense Refill  . Ascorbic Acid (VITAMIN C WITH ROSE HIPS) 500 MG tablet Take 1,000 mg by  mouth daily.    Marland Kitchen atorvastatin (LIPITOR) 10 MG tablet Take 1 tablet by mouth once daily 90 tablet 0  . Azelastine HCl 0.15 % SOLN Place 1-2 sprays into both nostrils 2 (two) times daily as needed. 30 mL 5  . B Complex-C (SUPER B COMPLEX PO) Take 1 tablet by mouth daily.    . Calcium Carbonate-Vitamin D (CALCIUM-VITAMIN D3 PO) Take 1 tablet by mouth every other day.    . Cholecalciferol (VITAMIN D3) 1000 units CAPS Take 1,000 Units by mouth daily.    . Garlic 2774 MG CAPS Take 1,000 mg by mouth every other day.    Marland Kitchen glucosamine-chondroitin 500-400 MG tablet Take 2 tablets by mouth daily.    Marland Kitchen levocetirizine (XYZAL) 5 MG tablet Take 10 mg by mouth 2 (two) times daily.    Marland Kitchen levothyroxine (SYNTHROID) 100 MCG tablet Take 1 tablet (100 mcg total) by mouth daily before breakfast. 90 tablet 1  . losartan-hydrochlorothiazide (HYZAAR) 100-12.5 MG tablet Take 1 tablet by mouth daily after breakfast. 90 tablet 1  . Magnesium 100 MG TABS Take 100 mg by mouth every other day.    . metFORMIN (GLUCOPHAGE-XR) 500 MG 24 hr tablet Take 1 tablet (500 mg) in the morning and 2 tablets (1000 mg) at night 270 tablet 3  . Multiple Vitamins-Minerals (MULTIVITAMIN ADULTS 50+ PO) Take 1 tablet by mouth daily.    . Omega-3 Fatty Acids (OMEGA 3 PO) Take 3 capsules by mouth daily.    . pantoprazole (PROTONIX) 40 MG tablet Take 1 tablet (40 mg  total) by mouth daily. 90 tablet 1  . vitamin E 400 UNIT capsule Take 400 Units by mouth every other day.    . [DISCONTINUED] Azelastine-Fluticasone 137-50 MCG/ACT SUSP Place 1 spray into the nose 2 (two) times daily as needed. (Patient not taking: Reported on 09/16/2019) 23 g 2  . [DISCONTINUED] fluticasone (FLONASE) 50 MCG/ACT nasal spray Place 2 sprays into both nostrils daily. (Patient not taking: Reported on 09/16/2019) 1 g 5   No current facility-administered medications on file prior to visit.    Past Medical History:  Diagnosis Date  . Arthritis   . Diabetes mellitus without  complication ([place])   . Diverticulitis of colon   . GERD (gastroesophageal reflux disease)   . Headache   . Hypertension   . Hypothyroidism   . Thyroid disease     Past Surgical History:  Procedure Laterality Date  . ANKLE FRACTURE SURGERY    . ANTERIOR CERVICAL DECOMP/DISCECTOMY FUSION N/A 04/05/2016   Procedure: C5-6, C6-7 Anterior Cervical Discectomy and Fusion, Allograft, Plate;  Surgeon: Marybelle Killings, MD;  Location: [facility];  Service: Orthopedics;  Laterality: N/A;  . ANTERIOR FUSION CERVICAL SPINE  04/05/2016   c6 c7  . CARPAL TUNNEL RELEASE Bilateral   . 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 JOINT REPLACEMENT  11/10   right knee replaced   . KNEE SURGERY    . LEFT HEART CATH AND CORONARY ANGIOGRAPHY N/A 02/06/2017   Procedure: LEFT HEART CATH AND CORONARY ANGIOGRAPHY;  Surgeon: Belva Crome, MD;  Location: [facility];  Service: Cardiovascular;  Laterality: N/A;  . LEG SURGERY  11/03   broken lower right leg crushed     Social History   Socioeconomic History  . Marital status: Divorced    Spouse name: Not on file  . Number of children: Not on file  . Years of education: Not on file  . Highest education level: Not on file  Occupational History  . Not on file  Tobacco Use  . Smoking status: Former Smoker    Types: Cigarettes    Quit date: 01/10/2008    Years since quitting: 11.9  . Smokeless tobacco: Former Systems developer    Types: Secondary school teacher  . Vaping Use: Never used  Substance and Sexual Activity  . Alcohol use: Yes    Comment: occasionally  . Drug use: No  . Sexual activity: Yes    Birth control/protection: None    Comment: Married  Other Topics Concern  . Not on file  Social History Narrative  . Not on file   Social Determinants of Health   Financial Resource Strain: Not on file  Food Insecurity: Not on file  Transportation Needs: Not on file  Physical Activity: Not on file  Stress: Not on file  Social Connections: Not on file     Family History  Problem Relation Age of Onset  . Hypertension Mother   . Diabetes Mother   . Heart attack Father   . Heart disease Father   . Diabetes Paternal Grandmother   . Cancer Paternal Grandfather        of eye    Review of Systems  Constitutional: Negative for chills and fever.  Respiratory: Positive for cough (chronic). Negative for shortness of breath and wheezing.   Cardiovascular: Negative for chest pain, palpitations and leg swelling.  Neurological: Positive for dizziness (intermittent) and headaches (occ, sinus).       Objective:  Vitals:   12/22/19 0752  BP: 116/72  Pulse: 62  Temp: 98 F (36.7 C)  SpO2: 98%   BP Readings from Last 3 Encounters:  12/22/19 116/72  11/06/19 110/66  09/16/19 110/76   Wt Readings from Last 3 Encounters:  12/22/19 201 lb 9.6 oz (91.4 kg)  08/20/19 198 lb (89.8 kg)  06/24/19 193 lb (87.5 kg)   Body mass index is 28.12 kg/m.   Physical Exam    Constitutional: Appears well-developed and well-nourished. No distress.  HENT:  Head: Normocephalic and atraumatic. Ears: Normal bilateral ear canals and tympanic membranes Neck: Neck supple. No tracheal deviation present. No thyromegaly present.  No cervical lymphadenopathy Cardiovascular: Normal rate, regular rhythm and normal heart sounds.   No murmur heard. No carotid bruit .  No edema Pulmonary/Chest: Effort normal and breath sounds normal. No respiratory distress. No has no wheezes. No rales.  Skin: Skin is warm and dry. Not diaphoretic.  Psychiatric: Normal mood and affect. Behavior is normal.      Assessment & Plan:    See Problem List for Assessment and Plan of chronic medical problems.    This visit occurred during the SARS-CoV-2 public health emergency.  Safety protocols were in place, including screening questions prior to the visit, additional usage of staff PPE, and extensive cleaning of exam room while observing appropriate contact time as indicated  for disinfecting solutions.

## 2019-12-21 NOTE — Patient Instructions (Addendum)
  Blood work was ordered.      Medications changes include :   meloxicam 15 mg diaily with food.  Do not take any ibuprofen when taking this.   Your prescription(s) have been submitted to your pharmacy.     Please followup in 6 months

## 2019-12-22 ENCOUNTER — Encounter: Payer: Self-pay | Admitting: Internal Medicine

## 2019-12-22 ENCOUNTER — Other Ambulatory Visit: Payer: Self-pay

## 2019-12-22 ENCOUNTER — Ambulatory Visit (INDEPENDENT_AMBULATORY_CARE_PROVIDER_SITE_OTHER): Payer: Medicare Other | Admitting: Internal Medicine

## 2019-12-22 VITALS — BP 116/72 | HR 62 | Temp 98.0°F | Ht 71.0 in | Wt 201.6 lb

## 2019-12-22 DIAGNOSIS — I10 Essential (primary) hypertension: Secondary | ICD-10-CM

## 2019-12-22 DIAGNOSIS — M5431 Sciatica, right side: Secondary | ICD-10-CM | POA: Diagnosis not present

## 2019-12-22 DIAGNOSIS — M5031 Other cervical disc degeneration,  high cervical region: Secondary | ICD-10-CM | POA: Diagnosis not present

## 2019-12-22 DIAGNOSIS — E039 Hypothyroidism, unspecified: Secondary | ICD-10-CM | POA: Diagnosis not present

## 2019-12-22 DIAGNOSIS — E118 Type 2 diabetes mellitus with unspecified complications: Secondary | ICD-10-CM | POA: Diagnosis not present

## 2019-12-22 DIAGNOSIS — M9901 Segmental and somatic dysfunction of cervical region: Secondary | ICD-10-CM | POA: Diagnosis not present

## 2019-12-22 DIAGNOSIS — M9903 Segmental and somatic dysfunction of lumbar region: Secondary | ICD-10-CM | POA: Diagnosis not present

## 2019-12-22 DIAGNOSIS — Z125 Encounter for screening for malignant neoplasm of prostate: Secondary | ICD-10-CM | POA: Diagnosis not present

## 2019-12-22 DIAGNOSIS — K219 Gastro-esophageal reflux disease without esophagitis: Secondary | ICD-10-CM

## 2019-12-22 DIAGNOSIS — M159 Polyosteoarthritis, unspecified: Secondary | ICD-10-CM | POA: Insufficient documentation

## 2019-12-22 DIAGNOSIS — E782 Mixed hyperlipidemia: Secondary | ICD-10-CM

## 2019-12-22 LAB — CBC WITH DIFFERENTIAL/PLATELET
Basophils Absolute: 0.1 10*3/uL (ref 0.0–0.1)
Basophils Relative: 2 % (ref 0.0–3.0)
Eosinophils Absolute: 1 10*3/uL — ABNORMAL HIGH (ref 0.0–0.7)
Eosinophils Relative: 16.7 % — ABNORMAL HIGH (ref 0.0–5.0)
HCT: 40.3 % (ref 39.0–52.0)
Hemoglobin: 13.4 g/dL (ref 13.0–17.0)
Lymphocytes Relative: 24.1 % (ref 12.0–46.0)
Lymphs Abs: 1.4 10*3/uL (ref 0.7–4.0)
MCHC: 33.4 g/dL (ref 30.0–36.0)
MCV: 90 fl (ref 78.0–100.0)
Monocytes Absolute: 0.5 10*3/uL (ref 0.1–1.0)
Monocytes Relative: 9.5 % (ref 3.0–12.0)
Neutro Abs: 2.8 10*3/uL (ref 1.4–7.7)
Neutrophils Relative %: 47.7 % (ref 43.0–77.0)
Platelets: 278 10*3/uL (ref 150.0–400.0)
RBC: 4.47 Mil/uL (ref 4.22–5.81)
RDW: 13.7 % (ref 11.5–15.5)
WBC: 5.8 10*3/uL (ref 4.0–10.5)

## 2019-12-22 LAB — COMPREHENSIVE METABOLIC PANEL
ALT: 20 U/L (ref 0–53)
AST: 20 U/L (ref 0–37)
Albumin: 4.3 g/dL (ref 3.5–5.2)
Alkaline Phosphatase: 62 U/L (ref 39–117)
BUN: 24 mg/dL — ABNORMAL HIGH (ref 6–23)
CO2: 28 mEq/L (ref 19–32)
Calcium: 9.6 mg/dL (ref 8.4–10.5)
Chloride: 103 mEq/L (ref 96–112)
Creatinine, Ser: 1.13 mg/dL (ref 0.40–1.50)
GFR: 67.44 mL/min (ref >60.00)
Glucose, Bld: 120 mg/dL — ABNORMAL HIGH (ref 70–99)
Potassium: 4.4 mEq/L (ref 3.5–5.1)
Sodium: 139 mEq/L (ref 135–145)
Total Bilirubin: 0.5 mg/dL (ref 0.2–1.2)
Total Protein: 6.8 g/dL (ref 6.0–8.3)

## 2019-12-22 LAB — LIPID PANEL
Cholesterol: 133 mg/dL (ref 0–200)
HDL: 45.3 mg/dL (ref >39.00)
LDL Cholesterol: 68 mg/dL (ref 0–99)
NonHDL: 87.65
Total CHOL/HDL Ratio: 3
Triglycerides: 98 mg/dL (ref 0.0–149.0)
VLDL: 19.6 mg/dL (ref 0.0–40.0)

## 2019-12-22 LAB — TSH: TSH: 5.33 u[IU]/mL — ABNORMAL HIGH (ref 0.35–4.50)

## 2019-12-22 LAB — PSA, MEDICARE: PSA: 0.3 ng/ml (ref 0.10–4.00)

## 2019-12-22 LAB — HEMOGLOBIN A1C: Hgb A1c MFr Bld: 6.7 % — ABNORMAL HIGH (ref 4.6–6.5)

## 2019-12-22 MED ORDER — LEVOTHYROXINE SODIUM 100 MCG PO TABS
100.0000 ug | ORAL_TABLET | Freq: Every day | ORAL | 1 refills | Status: DC
Start: 2019-12-22 — End: 2019-12-24

## 2019-12-22 MED ORDER — LOSARTAN POTASSIUM-HCTZ 100-12.5 MG PO TABS
1.0000 | ORAL_TABLET | Freq: Every day | ORAL | 1 refills | Status: DC
Start: 2019-12-22 — End: 2020-06-21

## 2019-12-22 MED ORDER — ATORVASTATIN CALCIUM 10 MG PO TABS: 10.0000 mg | ORAL_TABLET | Freq: Every day | ORAL | 0 refills | Status: DC

## 2019-12-22 MED ORDER — METFORMIN HCL ER 500 MG PO TB24
ORAL_TABLET | ORAL | 3 refills | Status: DC
Start: 2019-12-22 — End: 2020-06-21

## 2019-12-22 MED ORDER — MELOXICAM 15 MG PO TABS: 15.0000 mg | ORAL_TABLET | Freq: Every day | ORAL | 5 refills | Status: DC

## 2019-12-22 MED ORDER — PANTOPRAZOLE SODIUM 40 MG PO TBEC
40.0000 mg | DELAYED_RELEASE_TABLET | Freq: Every day | ORAL | 1 refills | Status: DC
Start: 2019-12-22 — End: 2020-06-21

## 2019-12-22 NOTE — Assessment & Plan Note (Signed)
Chronic  Clinically euthyroid Currently taking levothyroxine 100 mcg daily Check tsh  Titrate med dose if needed  

## 2019-12-22 NOTE — Assessment & Plan Note (Signed)
Chronic Currently taking ibuprofen 400 mg twice daily and that does help-wondering what else he can do Besides pain he has decreased strength and stiffness Trial of meloxicam 15 mg daily with food.  Advised no other NSAIDs while taking this Discussed risks of chronic NSAID use

## 2019-12-22 NOTE — Assessment & Plan Note (Signed)
Chronic Check lipid panel  Continue atorvastatin 10 mg daiily Regular exercise and healthy diet encouraged

## 2019-12-22 NOTE — Assessment & Plan Note (Signed)
Chronic °BP well controlled °Continue hyzaar 100-12.5 mg daily °cmp ° °

## 2019-12-22 NOTE — Assessment & Plan Note (Signed)
Chronic GERD controlled Continue pantoprazole 40 mg daily 

## 2019-12-22 NOTE — Assessment & Plan Note (Signed)
Chronic Continue metformin 500 mg in am, 1000 mg in evening Check a1c Low sugar / carb diet Stressed regular exercise

## 2019-12-24 MED ORDER — LEVOTHYROXINE SODIUM 100 MCG PO TABS: ORAL_TABLET | ORAL | 1 refills | Status: DC

## 2019-12-24 NOTE — Addendum Note (Signed)
Addended by: Binnie Rail on: 12/24/2019 07:52 AM   Modules accepted: Orders

## 2019-12-25 DIAGNOSIS — Z23 Encounter for immunization: Secondary | ICD-10-CM | POA: Diagnosis not present

## 2019-12-31 ENCOUNTER — Telehealth: Payer: Self-pay | Admitting: Allergy and Immunology

## 2019-12-31 MED ORDER — AZELASTINE HCL 0.15 % NA SOLN: 1.0000 | Freq: Two times a day (BID) | NASAL | 5 refills | Status: DC | PRN

## 2019-12-31 NOTE — Telephone Encounter (Signed)
Patient informed that refill has been sent in.

## 2019-12-31 NOTE — Telephone Encounter (Signed)
Patient is requesting a refill for azelastine. He called pharmacy and they told him to call us. Last seen 09/2019. Has an appointment scheduled for 02/19/20. [facility] in [place].

## 2020-01-04 ENCOUNTER — Other Ambulatory Visit: Payer: Self-pay

## 2020-01-04 ENCOUNTER — Ambulatory Visit (HOSPITAL_COMMUNITY)
Admission: EM | Admit: 2020-01-04 | Discharge: 2020-01-04 | Disposition: A | Discharge status: Home / Self Care | Payer: Medicare Other | Attending: Emergency Medicine | Admitting: Emergency Medicine

## 2020-01-04 DIAGNOSIS — Z20822 Contact with and (suspected) exposure to covid-19: Secondary | ICD-10-CM | POA: Insufficient documentation

## 2020-01-04 DIAGNOSIS — Z1152 Encounter for screening for COVID-19: Secondary | ICD-10-CM

## 2020-01-04 LAB — SARS CORONAVIRUS 2 (TAT 6-24 HRS): SARS Coronavirus 2: NEGATIVE

## 2020-01-04 NOTE — ED Triage Notes (Signed)
Pt presents for covid testing after a positive exposure.

## 2020-01-12 DIAGNOSIS — M9901 Segmental and somatic dysfunction of cervical region: Secondary | ICD-10-CM | POA: Diagnosis not present

## 2020-01-12 DIAGNOSIS — M5431 Sciatica, right side: Secondary | ICD-10-CM | POA: Diagnosis not present

## 2020-01-12 DIAGNOSIS — M5031 Other cervical disc degeneration,  high cervical region: Secondary | ICD-10-CM | POA: Diagnosis not present

## 2020-01-12 DIAGNOSIS — M9903 Segmental and somatic dysfunction of lumbar region: Secondary | ICD-10-CM | POA: Diagnosis not present

## 2020-02-02 DIAGNOSIS — M5431 Sciatica, right side: Secondary | ICD-10-CM | POA: Diagnosis not present

## 2020-02-02 DIAGNOSIS — M5031 Other cervical disc degeneration,  high cervical region: Secondary | ICD-10-CM | POA: Diagnosis not present

## 2020-02-02 DIAGNOSIS — M9901 Segmental and somatic dysfunction of cervical region: Secondary | ICD-10-CM | POA: Diagnosis not present

## 2020-02-02 DIAGNOSIS — M9903 Segmental and somatic dysfunction of lumbar region: Secondary | ICD-10-CM | POA: Diagnosis not present

## 2020-02-16 DIAGNOSIS — M5031 Other cervical disc degeneration,  high cervical region: Secondary | ICD-10-CM | POA: Diagnosis not present

## 2020-02-16 DIAGNOSIS — M9903 Segmental and somatic dysfunction of lumbar region: Secondary | ICD-10-CM | POA: Diagnosis not present

## 2020-02-16 DIAGNOSIS — M9901 Segmental and somatic dysfunction of cervical region: Secondary | ICD-10-CM | POA: Diagnosis not present

## 2020-02-16 DIAGNOSIS — M5431 Sciatica, right side: Secondary | ICD-10-CM | POA: Diagnosis not present

## 2020-02-17 ENCOUNTER — Ambulatory Visit: Payer: Medicare Other | Admitting: Allergy and Immunology

## 2020-02-19 ENCOUNTER — Other Ambulatory Visit: Payer: Self-pay

## 2020-02-19 ENCOUNTER — Ambulatory Visit (INDEPENDENT_AMBULATORY_CARE_PROVIDER_SITE_OTHER): Payer: Medicare Other | Admitting: Allergy & Immunology

## 2020-02-19 ENCOUNTER — Encounter: Payer: Self-pay | Admitting: Allergy & Immunology

## 2020-02-19 VITALS — BP 144/84 | HR 68 | Temp 97.7°F | Resp 16 | Ht 69.0 in | Wt 195.5 lb

## 2020-02-19 DIAGNOSIS — J3089 Other allergic rhinitis: Secondary | ICD-10-CM

## 2020-02-19 DIAGNOSIS — R059 Cough, unspecified: Secondary | ICD-10-CM | POA: Diagnosis not present

## 2020-02-19 MED ORDER — AZELASTINE HCL 0.1 % NA SOLN: NASAL | 11 refills | Status: DC

## 2020-02-19 NOTE — Progress Notes (Signed)
FOLLOW UP  Date of Service/Encounter:  02/19/20   Assessment:   Perennial allergic rhinitis with possible nonallergic component  Cough    Overall, [name] is doing very well. We are adding on nasal saline rinses prior to the administration of Astelin to see if this can make it more efficacious. And I think there is anything else we can change him to at this point since he has failed all of the nasal steroids. If postnasal drip becomes an issue, Atrovent might be a consideration. I did tell him that we are happy to see him again in the future, but his primary care provider, Dr. Quay Burow, could certainly refill his medications if needed. He is fine with this plan.  Plan/Recommendations:   1. Perennial allergic rhinitis with possible nonallergic component - Continue with Astelin two sprays per nostril up to twice daily. - Add on nasal saline rinses prior to the Astelin.   2. Return in about 1 year (around 02/18/2021).   Subjective:   [name] is a 70 y.o. male presenting today for follow up of  Chief Complaint  Patient presents with  . Allergic Rhinitis     [name] has a history of the following: Patient Active Problem List   Diagnosis Date Noted  . Generalized osteoarthritis of hand 12/22/2019  . Acquired trigger finger of right ring finger 08/20/2019  . Environmental allergies 01/13/2019  . Cough 12/17/2018  . Allergic conjunctivitis 12/17/2018  . Chronic frontal sinusitis 12/17/2018  . Perennial allergic rhinitis with possible nonallergic component 11/13/2018  . Eosinophilic leukocytosis 48/18/5631  . Gout 06/28/2018  . Hallux rigidus of right foot 08/30/2017  . Hypertension 06/26/2017  . OSA (obstructive sleep apnea) 06/26/2017  . Tobacco abuse, in remission 06/26/2017  . Type 2 diabetes mellitus with complication, without long-term current use of insulin ([place]) 06/22/2016  . Hypothyroidism 06/22/2016  . Hyperlipidemia 06/22/2016  . GERD  (gastroesophageal reflux disease) 06/22/2016  . Status post cervical spinal fusion 05/23/2016  . Cervical spinal stenosis 04/05/2016  . Pain in right wrist 03/07/2016  . Neck pain 12/01/2015  . Other spondylosis with radiculopathy, cervical region 12/01/2015  . Diverticulitis of colon with perforation 10/20/2010    History obtained from: chart review and patient.  [name] is a 70 y.o. male presenting for a follow up visit.  He was last seen in September 2021.  He was previously followed by Dr. Verlin Fester, who is no longer with the practice.  For his rhinitis, he was continued on Astelin 1 to 2 sprays per nostril twice daily as well as nasal saline lavage.  The addition of Mucinex was added to use as needed.  For his allergic conjunctivitis, he was continued with natural tears as needed.  He was also diagnosed with upper airway cough syndrome.  Since last visit, he has mostly done well. His symptoms tend to come and go. It tends to get worse in the spring time. Normally he only uses the Astelin in the morning. It will open him up, but when he sprays it he blows his nose and everything goes away. Under normal circumstances, he uses the Astelin the morning. He will use it at night when he uses it at night.  He has been on Flonase in the past as well as a couple of other nasal steroids. None of them seem to have helped. While he does not like the taste of the Astelin, it seems to work the best.  He has not needed  antibiotics for sinus infections. Some mornings, he will wake up with a terrible sinus headache. He is using Excedrin migraine and it did help. He might have a migraine component to all of these symptoms.   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 He is not really retired. He worked almost 7 years at [employer]. He did yard service on the side. He was a previous smoker, but quit with the help of Wellbutrin. This was around 10 years ago. He quit because he was told that he needed to to  see his grandchildren. He has 2 grandchildren now, ages 57 and 24.  Otherwise, there have been no changes to his past medical history, surgical history, family history, or social history.    Review of Systems  Constitutional: Negative.  Negative for chills, fever, malaise/fatigue and weight loss.  HENT: Positive for congestion. Negative for ear discharge, ear pain and sinus pain.        Positive for postnasal drip.  Eyes: Negative for pain, discharge and redness.  Respiratory: Negative for cough, sputum production, shortness of breath and wheezing.   Cardiovascular: Negative.  Negative for chest pain and palpitations.  Gastrointestinal: Negative for abdominal pain, constipation, diarrhea, heartburn, nausea and vomiting.  Skin: Negative.  Negative for itching and rash.  Neurological: Negative for dizziness and headaches.  Endo/Heme/Allergies: Positive for environmental allergies. Does not bruise/bleed easily.       Objective:   Blood pressure (!) 144/84, pulse 68, temperature 97.7 F (36.5 C), temperature source Tympanic, resp. rate 16, height 5\' 9"  (1.753 m), weight 195 lb 8.8 oz (88.7 kg), SpO2 98 %. Body mass index is 28.88 kg/m.   Physical Exam:  Physical Exam Constitutional:      Appearance: He is well-developed.  HENT:     Head: Normocephalic and atraumatic.     Right Ear: Tympanic membrane, ear canal and external ear normal.     Left Ear: Tympanic membrane, ear canal and external ear normal.     Nose: No nasal deformity, septal deviation, mucosal edema, rhinorrhea or epistaxis.     Right Turbinates: Enlarged and swollen.     Left Turbinates: Enlarged and swollen.     Right Sinus: No maxillary sinus tenderness or frontal sinus tenderness.     Left Sinus: No maxillary sinus tenderness or frontal sinus tenderness.     Comments: Clear rhinorrhea bilaterally. Turbinates within normal limits. No nasal polyps.    Mouth/Throat:     Mouth: Oropharynx is clear and moist. Mucous  membranes are not pale and not dry.     Pharynx: Uvula midline.  Eyes:     General:        Right eye: No discharge.        Left eye: No discharge.     Extraocular Movements: EOM normal.     Conjunctiva/sclera: Conjunctivae normal.     Right eye: Right conjunctiva is not injected. No chemosis.    Left eye: Left conjunctiva is not injected. No chemosis.    Pupils: Pupils are equal, round, and reactive to light.  Cardiovascular:     Rate and Rhythm: Normal rate and regular rhythm.     Heart sounds: Normal heart sounds.  Pulmonary:     Effort: Pulmonary effort is normal. No tachypnea, accessory muscle usage or respiratory distress.     Breath sounds: Normal breath sounds. No wheezing, rhonchi or rales.     Comments: Moving air well in all lung fields. Chest:  Chest wall: No tenderness.  Lymphadenopathy:     Cervical: No cervical adenopathy.  Skin:    Coloration: Skin is not pale.     Findings: No abrasion, erythema, petechiae or rash. Rash is not papular, urticarial or vesicular.  Neurological:     Mental Status: He is alert.  Psychiatric:        Mood and Affect: Mood and affect normal.        Behavior: Behavior is cooperative.      Diagnostic studies: none       Salvatore Marvel, MD  [facility]

## 2020-02-19 NOTE — Patient Instructions (Addendum)
1. Perennial allergic rhinitis with possible nonallergic component - Continue with Astelin two sprays per nostril up to twice daily. - Add on nasal saline rinses prior to the Astelin.   2. Return in about 1 year (around 02/18/2021).    Please inform us of any Emergency Department visits, hospitalizations, or changes in symptoms. Call us before going to the ED for breathing or allergy symptoms since we might be able to fit you in for a sick visit. Feel free to contact us anytime with any questions, problems, or concerns.  It was a pleasure to meet you today!  Websites that have reliable patient information: 1. American Academy of Asthma, Allergy, and Immunology: www.aaaai.org 2. Food Allergy Research and Education (FARE): foodallergy.org 3. Mothers of Asthmatics: http://www.asthmacommunitynetwork.org 4. American College of Allergy, Asthma, and Immunology: www.acaai.org   COVID-19 Vaccine Information can be found at: [email or web address].co.uk For questions related to vaccine distribution or appointments, please email [email or web address] or call [phone].   We realize that you might be concerned about having an allergic reaction to the COVID19 vaccines. To help with that concern, WE ARE OFFERING THE COVID19 VACCINES IN OUR OFFICE! Ask the front desk for dates!     "Like" [place] on Facebook and Instagram for our latest updates!       Make sure you are registered to vote! If you have moved or changed any of your contact information, you will need to get this updated before voting!  In some cases, you MAY be able to register to vote online: [email or web address]

## 2020-03-01 DIAGNOSIS — M5431 Sciatica, right side: Secondary | ICD-10-CM | POA: Diagnosis not present

## 2020-03-01 DIAGNOSIS — M9901 Segmental and somatic dysfunction of cervical region: Secondary | ICD-10-CM | POA: Diagnosis not present

## 2020-03-01 DIAGNOSIS — M5031 Other cervical disc degeneration,  high cervical region: Secondary | ICD-10-CM | POA: Diagnosis not present

## 2020-03-01 DIAGNOSIS — M9903 Segmental and somatic dysfunction of lumbar region: Secondary | ICD-10-CM | POA: Diagnosis not present

## 2020-03-08 ENCOUNTER — Other Ambulatory Visit: Payer: Self-pay | Admitting: Internal Medicine

## 2020-03-15 DIAGNOSIS — M9903 Segmental and somatic dysfunction of lumbar region: Secondary | ICD-10-CM | POA: Diagnosis not present

## 2020-03-15 DIAGNOSIS — M5031 Other cervical disc degeneration,  high cervical region: Secondary | ICD-10-CM | POA: Diagnosis not present

## 2020-03-15 DIAGNOSIS — M9901 Segmental and somatic dysfunction of cervical region: Secondary | ICD-10-CM | POA: Diagnosis not present

## 2020-03-15 DIAGNOSIS — M5431 Sciatica, right side: Secondary | ICD-10-CM | POA: Diagnosis not present

## 2020-03-29 DIAGNOSIS — M5431 Sciatica, right side: Secondary | ICD-10-CM | POA: Diagnosis not present

## 2020-03-29 DIAGNOSIS — M9903 Segmental and somatic dysfunction of lumbar region: Secondary | ICD-10-CM | POA: Diagnosis not present

## 2020-03-29 DIAGNOSIS — M5031 Other cervical disc degeneration,  high cervical region: Secondary | ICD-10-CM | POA: Diagnosis not present

## 2020-03-29 DIAGNOSIS — M9901 Segmental and somatic dysfunction of cervical region: Secondary | ICD-10-CM | POA: Diagnosis not present

## 2020-04-12 DIAGNOSIS — M9901 Segmental and somatic dysfunction of cervical region: Secondary | ICD-10-CM | POA: Diagnosis not present

## 2020-04-12 DIAGNOSIS — M5431 Sciatica, right side: Secondary | ICD-10-CM | POA: Diagnosis not present

## 2020-04-12 DIAGNOSIS — M9903 Segmental and somatic dysfunction of lumbar region: Secondary | ICD-10-CM | POA: Diagnosis not present

## 2020-04-12 DIAGNOSIS — M5031 Other cervical disc degeneration,  high cervical region: Secondary | ICD-10-CM | POA: Diagnosis not present

## 2020-04-26 DIAGNOSIS — M5031 Other cervical disc degeneration,  high cervical region: Secondary | ICD-10-CM | POA: Diagnosis not present

## 2020-04-26 DIAGNOSIS — M9901 Segmental and somatic dysfunction of cervical region: Secondary | ICD-10-CM | POA: Diagnosis not present

## 2020-04-26 DIAGNOSIS — M9903 Segmental and somatic dysfunction of lumbar region: Secondary | ICD-10-CM | POA: Diagnosis not present

## 2020-04-26 DIAGNOSIS — M5431 Sciatica, right side: Secondary | ICD-10-CM | POA: Diagnosis not present

## 2020-05-10 DIAGNOSIS — M9901 Segmental and somatic dysfunction of cervical region: Secondary | ICD-10-CM | POA: Diagnosis not present

## 2020-05-10 DIAGNOSIS — M9903 Segmental and somatic dysfunction of lumbar region: Secondary | ICD-10-CM | POA: Diagnosis not present

## 2020-05-10 DIAGNOSIS — M5031 Other cervical disc degeneration,  high cervical region: Secondary | ICD-10-CM | POA: Diagnosis not present

## 2020-05-10 DIAGNOSIS — M5431 Sciatica, right side: Secondary | ICD-10-CM | POA: Diagnosis not present

## 2020-05-17 NOTE — Progress Notes (Signed)
Subjective:    Patient ID: [name], male    DOB: 10-30-1950, 70 y.o.   MRN: [id number]  HPI He is here for an acute visit for cold symptoms/sinus infection.  His symptoms started > 1 week  He is experiencing nasal congestion, sinus pain and pressure, watery eyes, mild chest tightness, productive cough and headaches.  He has a history of sinus infections.  He denies any fever, sore throat, shortness of breath.  He has tried taking mucinex, coricidin, saline nasal spray      Medications and allergies reviewed with patient and updated if appropriate.  Patient Active Problem List   Diagnosis Date Noted  . Generalized osteoarthritis of hand 12/22/2019  . Acquired trigger finger of right ring finger 08/20/2019  . Environmental allergies 01/13/2019  . Cough 12/17/2018  . Allergic conjunctivitis 12/17/2018  . Chronic frontal sinusitis 12/17/2018  . Perennial allergic rhinitis with possible nonallergic component 11/13/2018  . Eosinophilic leukocytosis 24/40/1027  . Gout 06/28/2018  . Hallux rigidus of right foot 08/30/2017  . Hypertension 06/26/2017  . OSA (obstructive sleep apnea) 06/26/2017  . Tobacco abuse, in remission 06/26/2017  . Type 2 diabetes mellitus with complication, without long-term current use of insulin ([place]) 06/22/2016  . Hypothyroidism 06/22/2016  . Hyperlipidemia 06/22/2016  . GERD (gastroesophageal reflux disease) 06/22/2016  . Status post cervical spinal fusion 05/23/2016  . Cervical spinal stenosis 04/05/2016  . Pain in right wrist 03/07/2016  . Neck pain 12/01/2015  . Other spondylosis with radiculopathy, cervical region 12/01/2015  . Diverticulitis of colon with perforation 10/20/2010    Current Outpatient Medications on File Prior to Visit  Medication Sig Dispense Refill  . Ascorbic Acid (VITAMIN C WITH ROSE HIPS) 500 MG tablet Take 1,000 mg by mouth daily.    Marland Kitchen atorvastatin (LIPITOR) 10 MG tablet Take 1 tablet by mouth once daily 90  tablet 0  . azelastine (ASTELIN) 0.1 % nasal spray 1-2 sprays per nostril twice daily for runny nose 30 mL 11  . Azelastine HCl 0.15 % SOLN Place 1-2 sprays into both nostrils 2 (two) times daily as needed. 30 mL 5  . B Complex-C (SUPER B COMPLEX PO) Take 1 tablet by mouth daily.    . Calcium Carbonate-Vitamin D (CALCIUM-VITAMIN D3 PO) Take 1 tablet by mouth every other day.    . Cholecalciferol (VITAMIN D3) 1000 units CAPS Take 1,000 Units by mouth daily.    . Garlic 2536 MG CAPS Take 1,000 mg by mouth every other day.    Marland Kitchen glucosamine-chondroitin 500-400 MG tablet Take 2 tablets by mouth daily.    Marland Kitchen levocetirizine (XYZAL) 5 MG tablet Take 10 mg by mouth 2 (two) times daily.    Marland Kitchen levothyroxine (SYNTHROID) 100 MCG tablet Take 1 tablet by mouth daily before breakfast 6 days a week.  Take 2 tablets by mouth daily before breakfast once a week. 94 tablet 1  . losartan-hydrochlorothiazide (HYZAAR) 100-12.5 MG tablet Take 1 tablet by mouth daily after breakfast. 90 tablet 1  . Magnesium 100 MG TABS Take 100 mg by mouth every other day.    . meloxicam (MOBIC) 15 MG tablet Take 1 tablet (15 mg total) by mouth daily. 30 tablet 5  . metFORMIN (GLUCOPHAGE-XR) 500 MG 24 hr tablet Take 1 tablet (500 mg) in the morning and 2 tablets (1000 mg) at night 270 tablet 3  . Multiple Vitamins-Minerals (MULTIVITAMIN ADULTS 50+ PO) Take 1 tablet by mouth daily.    . Omega-3 Fatty Acids (  OMEGA 3 PO) Take 3 capsules by mouth daily.    . pantoprazole (PROTONIX) 40 MG tablet Take 1 tablet (40 mg total) by mouth daily. 90 tablet 1  . vitamin E 400 UNIT capsule Take 400 Units by mouth every other day.    . [DISCONTINUED] Azelastine-Fluticasone 137-50 MCG/ACT SUSP Place 1 spray into the nose 2 (two) times daily as needed. (Patient not taking: Reported on 09/16/2019) 23 g 2  . [DISCONTINUED] fluticasone (FLONASE) 50 MCG/ACT nasal spray Place 2 sprays into both nostrils daily. (Patient not taking: Reported on 09/16/2019) 1 g 5   No  current facility-administered medications on file prior to visit.    Past Medical History:  Diagnosis Date  . Arthritis   . Diabetes mellitus without complication ([place])   . Diverticulitis of colon   . GERD (gastroesophageal reflux disease)   . Headache   . Hypertension   . Hypothyroidism   . Thyroid disease     Past Surgical History:  Procedure Laterality Date  . ANKLE FRACTURE SURGERY    . ANTERIOR CERVICAL DECOMP/DISCECTOMY FUSION N/A 04/05/2016   Procedure: C5-6, C6-7 Anterior Cervical Discectomy and Fusion, Allograft, Plate;  Surgeon: Marybelle Killings, MD;  Location: [facility];  Service: Orthopedics;  Laterality: N/A;  . ANTERIOR FUSION CERVICAL SPINE  04/05/2016   c6 c7  . CARPAL TUNNEL RELEASE Bilateral   . 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 JOINT REPLACEMENT  11/10   right knee replaced   . KNEE SURGERY    . LEFT HEART CATH AND CORONARY ANGIOGRAPHY N/A 02/06/2017   Procedure: LEFT HEART CATH AND CORONARY ANGIOGRAPHY;  Surgeon: Belva Crome, MD;  Location: [facility];  Service: Cardiovascular;  Laterality: N/A;  . LEG SURGERY  11/03   broken lower right leg crushed     Social History   Socioeconomic History  . Marital status: Divorced    Spouse name: Not on file  . Number of children: Not on file  . Years of education: Not on file  . Highest education level: Not on file  Occupational History  . Not on file  Tobacco Use  . Smoking status: Former Smoker    Types: Cigarettes    Quit date: 01/10/2008    Years since quitting: 12.3  . Smokeless tobacco: Former Systems developer    Types: Secondary school teacher  . Vaping Use: Never used  Substance and Sexual Activity  . Alcohol use: Yes    Comment: occasionally  . Drug use: No  . Sexual activity: Yes    Birth control/protection: None    Comment: Married  Other Topics Concern  . Not on file  Social History Narrative  . Not on file   Social Determinants of Health   Financial Resource Strain: Not on file   Food Insecurity: Not on file  Transportation Needs: Not on file  Physical Activity: Not on file  Stress: Not on file  Social Connections: Not on file    Family History  Problem Relation Age of Onset  . Hypertension Mother   . Diabetes Mother   . Heart attack Father   . Heart disease Father   . Diabetes Paternal Grandmother   . Cancer Paternal Grandfather        of eye    Review of Systems  Constitutional: Negative for fever.  HENT: Positive for congestion, sinus pressure and sinus pain. Negative for ear pain and sore throat.   Eyes: Positive  for discharge (watery).  Respiratory: Positive for cough (some sputum) and chest tightness (mild). Negative for shortness of breath and wheezing.   Neurological: Positive for headaches. Negative for dizziness.       Objective:   Vitals:   05/18/20 0853  BP: 136/72  Pulse: 71  Temp: 98.6 F (37 C)  SpO2: 96%   BP Readings from Last 3 Encounters:  05/18/20 136/72  02/19/20 (!) 144/84  01/04/20 137/83   Wt Readings from Last 3 Encounters:  05/18/20 199 lb (90.3 kg)  02/19/20 195 lb 8.8 oz (88.7 kg)  12/22/19 201 lb 9.6 oz (91.4 kg)   Body mass index is 29.39 kg/m.   Physical Exam    GENERAL APPEARANCE: Appears stated age, well appearing, NAD EYES: conjunctiva clear, no icterus HENT: bilateral tympanic membranes and ear canals normal, oropharynx with no erythema or exudates, trachea midline, no cervical or supraclavicular lymphadenopathy LUNGS: Unlabored breathing, good air entry bilaterally, clear to auscultation without wheeze or crackles CARDIOVASCULAR: Normal S1,S2 , no edema SKIN: Warm, dry      Assessment & Plan:    See Problem List for Assessment and Plan of chronic medical problems.    This visit occurred during the SARS-CoV-2 public health emergency.  Safety protocols were in place, including screening questions prior to the visit, additional usage of staff PPE, and extensive cleaning of exam room while  observing appropriate contact time as indicated for disinfecting solutions.

## 2020-05-18 ENCOUNTER — Encounter: Payer: Self-pay | Admitting: Internal Medicine

## 2020-05-18 ENCOUNTER — Other Ambulatory Visit: Payer: Self-pay

## 2020-05-18 ENCOUNTER — Ambulatory Visit (INDEPENDENT_AMBULATORY_CARE_PROVIDER_SITE_OTHER): Payer: Medicare Other | Admitting: Internal Medicine

## 2020-05-18 DIAGNOSIS — J019 Acute sinusitis, unspecified: Secondary | ICD-10-CM | POA: Insufficient documentation

## 2020-05-18 MED ORDER — AMOXICILLIN-POT CLAVULANATE 875-125 MG PO TABS: 1.0000 | ORAL_TABLET | Freq: Two times a day (BID) | ORAL | 0 refills | Status: DC

## 2020-05-18 NOTE — Patient Instructions (Signed)
° ° °  Take the antibiotic as prescribed - complete the entire course.   ° ° °Continue over the counter cold medication, advil and tylenol.  Increase your fluids and rest.  ° ° °Call if no improvement   ° °

## 2020-05-18 NOTE — Assessment & Plan Note (Signed)
Acute Likely bacterial  Start Augmentin 875-125 mg BID x 10 day otc cold medications Rest, fluid Call if no improvement  

## 2020-05-24 ENCOUNTER — Telehealth: Payer: Self-pay | Admitting: Internal Medicine

## 2020-05-24 DIAGNOSIS — M9903 Segmental and somatic dysfunction of lumbar region: Secondary | ICD-10-CM | POA: Diagnosis not present

## 2020-05-24 DIAGNOSIS — M5031 Other cervical disc degeneration,  high cervical region: Secondary | ICD-10-CM | POA: Diagnosis not present

## 2020-05-24 DIAGNOSIS — M5431 Sciatica, right side: Secondary | ICD-10-CM | POA: Diagnosis not present

## 2020-05-24 DIAGNOSIS — M9901 Segmental and somatic dysfunction of cervical region: Secondary | ICD-10-CM | POA: Diagnosis not present

## 2020-05-24 NOTE — Progress Notes (Signed)
*  Patient wasn't sure if he had met his yearly Deductible or not yet, I spoke with him about it and he is aware that if he hasn't met the deductible for this year yet by this appointment date then there would be a copay. *   Chronic Care Management   Note  05/24/2020 Name: [name] MRN: [id number] DOB: 1950/07/03  [name] is a 70 y.o. year old male who is a primary care patient of Burns, Claudina Lick, MD. I reached out to [name] by phone today in response to a referral sent by [name]'s PCP, Binnie Rail, MD.   [name] was given information about Chronic Care Management services today including:  1. CCM service includes personalized support from designated clinical staff supervised by his physician, including individualized plan of care and coordination with other care providers 2. 24/7 contact phone numbers for assistance for urgent and routine care needs. 3. Service will only be billed when office clinical staff spend 20 minutes or more in a month to coordinate care. 4. Only one practitioner may furnish and bill the service in a calendar month. 5. The patient may stop CCM services at any time (effective at the end of the month) by phone call to the office staff.   Patient agreed to services and verbal consent obtained.   Follow up plan:   Carley Perdue [employer] Scheduler

## 2020-06-08 DIAGNOSIS — M9901 Segmental and somatic dysfunction of cervical region: Secondary | ICD-10-CM | POA: Diagnosis not present

## 2020-06-08 DIAGNOSIS — M9903 Segmental and somatic dysfunction of lumbar region: Secondary | ICD-10-CM | POA: Diagnosis not present

## 2020-06-08 DIAGNOSIS — M5431 Sciatica, right side: Secondary | ICD-10-CM | POA: Diagnosis not present

## 2020-06-08 DIAGNOSIS — M5031 Other cervical disc degeneration,  high cervical region: Secondary | ICD-10-CM | POA: Diagnosis not present

## 2020-06-16 DIAGNOSIS — H52223 Regular astigmatism, bilateral: Secondary | ICD-10-CM | POA: Diagnosis not present

## 2020-06-16 DIAGNOSIS — H524 Presbyopia: Secondary | ICD-10-CM | POA: Diagnosis not present

## 2020-06-16 DIAGNOSIS — H5203 Hypermetropia, bilateral: Secondary | ICD-10-CM | POA: Diagnosis not present

## 2020-06-16 DIAGNOSIS — H02102 Unspecified ectropion of right lower eyelid: Secondary | ICD-10-CM | POA: Diagnosis not present

## 2020-06-16 DIAGNOSIS — H2513 Age-related nuclear cataract, bilateral: Secondary | ICD-10-CM | POA: Diagnosis not present

## 2020-06-16 DIAGNOSIS — H02105 Unspecified ectropion of left lower eyelid: Secondary | ICD-10-CM | POA: Diagnosis not present

## 2020-06-16 DIAGNOSIS — H43813 Vitreous degeneration, bilateral: Secondary | ICD-10-CM | POA: Diagnosis not present

## 2020-06-16 DIAGNOSIS — H4020X2 Unspecified primary angle-closure glaucoma, moderate stage: Secondary | ICD-10-CM | POA: Diagnosis not present

## 2020-06-16 DIAGNOSIS — E119 Type 2 diabetes mellitus without complications: Secondary | ICD-10-CM | POA: Diagnosis not present

## 2020-06-16 DIAGNOSIS — H02882 Meibomian gland dysfunction right lower eyelid: Secondary | ICD-10-CM | POA: Diagnosis not present

## 2020-06-16 DIAGNOSIS — H02885 Meibomian gland dysfunction left lower eyelid: Secondary | ICD-10-CM | POA: Diagnosis not present

## 2020-06-16 LAB — HM DIABETES EYE EXAM

## 2020-06-20 NOTE — Progress Notes (Signed)
Subjective:    Patient ID: [name], male    DOB: 04-06-1950, 70 y.o.   MRN: [id number]  HPI The patient is here for follow up of their chronic medical problems, including htn, DM, hld, hypothyroidism, gerd, gout  Occ dizziness/lightheadedness feeling - it is a combination of both - it occurs randomly.    He is taking all of his medications as prescribed.    Medications and allergies reviewed with patient and updated if appropriate.  Patient Active Problem List   Diagnosis Date Noted   Aortic atherosclerosis ([place]) 06/21/2020   Acute sinus infection 05/18/2020   Generalized osteoarthritis of hand 12/22/2019   Acquired trigger finger of right ring finger 08/20/2019   Environmental allergies 01/13/2019   Cough 12/17/2018   Allergic conjunctivitis 12/17/2018   Chronic frontal sinusitis 12/17/2018   Perennial allergic rhinitis with possible nonallergic component 78/93/8101   Eosinophilic leukocytosis 75/10/2583   Gout 06/28/2018   Hallux rigidus of right foot 08/30/2017   Hypertension 06/26/2017   OSA (obstructive sleep apnea) 06/26/2017   Tobacco abuse, in remission 06/26/2017   Type 2 diabetes mellitus with complication, without long-term current use of insulin ([place]) 06/22/2016   Hypothyroidism 06/22/2016   Hyperlipidemia 06/22/2016   GERD (gastroesophageal reflux disease) 06/22/2016   Status post cervical spinal fusion 05/23/2016   Cervical spinal stenosis 04/05/2016   Pain in right wrist 03/07/2016   Neck pain 12/01/2015   Other spondylosis with radiculopathy, cervical region 12/01/2015   Diverticulitis of colon with perforation 10/20/2010    Current Outpatient Medications on File Prior to Visit  Medication Sig Dispense Refill   Ascorbic Acid (VITAMIN C WITH ROSE HIPS) 500 MG tablet Take 1,000 mg by mouth daily.     atorvastatin (LIPITOR) 10 MG tablet Take 1 tablet by mouth once daily 90 tablet 0   azelastine (ASTELIN) 0.1 % nasal spray 1-2 sprays per nostril  twice daily for runny nose 30 mL 11   Azelastine HCl 0.15 % SOLN Place 1-2 sprays into both nostrils 2 (two) times daily as needed. 30 mL 5   B Complex-C (SUPER B COMPLEX PO) Take 1 tablet by mouth daily.     Calcium Carbonate-Vitamin D (CALCIUM-VITAMIN D3 PO) Take 1 tablet by mouth every other day.     Cholecalciferol (VITAMIN D3) 1000 units CAPS Take 1,000 Units by mouth daily.     Garlic 2778 MG CAPS Take 1,000 mg by mouth every other day.     glucosamine-chondroitin 500-400 MG tablet Take 2 tablets by mouth daily.     levocetirizine (XYZAL) 5 MG tablet Take 10 mg by mouth 2 (two) times daily.     levothyroxine (SYNTHROID) 100 MCG tablet Take 1 tablet by mouth daily before breakfast 6 days a week.  Take 2 tablets by mouth daily before breakfast once a week. 94 tablet 1   losartan-hydrochlorothiazide (HYZAAR) 100-12.5 MG tablet Take 1 tablet by mouth daily after breakfast. 90 tablet 1   Magnesium 100 MG TABS Take 100 mg by mouth every other day.     metFORMIN (GLUCOPHAGE-XR) 500 MG 24 hr tablet Take 1 tablet (500 mg) in the morning and 2 tablets (1000 mg) at night 270 tablet 3   Multiple Vitamins-Minerals (MULTIVITAMIN ADULTS 50+ PO) Take 1 tablet by mouth daily.     pantoprazole (PROTONIX) 40 MG tablet Take 1 tablet (40 mg total) by mouth daily. 90 tablet 1   vitamin E 400 UNIT capsule Take 400 Units by mouth every other  day.     [DISCONTINUED] Azelastine-Fluticasone 137-50 MCG/ACT SUSP Place 1 spray into the nose 2 (two) times daily as needed. (Patient not taking: Reported on 09/16/2019) 23 g 2   [DISCONTINUED] fluticasone (FLONASE) 50 MCG/ACT nasal spray Place 2 sprays into both nostrils daily. (Patient not taking: Reported on 09/16/2019) 1 g 5   No current facility-administered medications on file prior to visit.    Past Medical History:  Diagnosis Date   Arthritis    Diabetes mellitus without complication ([facility])    Diverticulitis of colon    GERD (gastroesophageal reflux disease)     Headache    Hypertension    Hypothyroidism    Thyroid disease     Past Surgical History:  Procedure Laterality Date   ANKLE FRACTURE SURGERY     ANTERIOR CERVICAL DECOMP/DISCECTOMY FUSION N/A 04/05/2016   Procedure: C5-6, C6-7 Anterior Cervical Discectomy and Fusion, Allograft, Plate;  Surgeon: Marybelle Killings, MD;  Location: [facility];  Service: Orthopedics;  Laterality: N/A;   ANTERIOR FUSION CERVICAL SPINE  04/05/2016   c6 c7   CARPAL TUNNEL RELEASE Bilateral    JOINT REPLACEMENT  01/07   left knee replaced    JOINT REPLACEMENT  11/10   right knee replaced    KNEE SURGERY     LEFT HEART CATH AND CORONARY ANGIOGRAPHY N/A 02/06/2017   Procedure: LEFT HEART CATH AND CORONARY ANGIOGRAPHY;  Surgeon: Belva Crome, MD;  Location: [facility];  Service: Cardiovascular;  Laterality: N/A;   LEG SURGERY  11/03   broken lower right leg crushed     Social History   Socioeconomic History   Marital status: Divorced    Spouse name: Not on file   Number of children: Not on file   Years of education: Not on file   Highest education level: Not on file  Occupational History   Not on file  Tobacco Use   Smoking status: Former    Pack years: 0.00    Types: Cigarettes    Quit date: 01/10/2008    Years since quitting: 12.4   Smokeless tobacco: Former    Types: Nurse, children's Use: Never used  Substance and Sexual Activity   Alcohol use: Yes    Comment: occasionally   Drug use: No   Sexual activity: Yes    Birth control/protection: None    Comment: Married  Other Topics Concern   Not on file  Social History Narrative   Not on file   Social Determinants of Health   Financial Resource Strain: Not on file  Food Insecurity: Not on file  Transportation Needs: Not on file  Physical Activity: Not on file  Stress: Not on file  Social Connections: Not on file    Family History  Problem Relation Age of Onset   Hypertension Mother    Diabetes Mother    Heart attack  Father    Heart disease Father    Diabetes Paternal Grandmother    Cancer Paternal Grandfather        of eye    Review of Systems  Constitutional:  Negative for chills and fever.  Respiratory:  Positive for cough (allergy related - sees Dr Verlin Fester). Negative for shortness of breath and wheezing.   Cardiovascular:  Negative for chest pain, palpitations and leg swelling.  Neurological:  Positive for dizziness and light-headedness. Negative for headaches.      Objective:   Vitals:   06/21/20 1359  BP: 138/72  Pulse: 94  Temp: 98.1 F (36.7 C)  SpO2: 96%   BP Readings from Last 3 Encounters:  06/21/20 138/72  05/18/20 136/72  02/19/20 (!) 144/84   Wt Readings from Last 3 Encounters:  06/21/20 194 lb (88 kg)  05/18/20 199 lb (90.3 kg)  02/19/20 195 lb 8.8 oz (88.7 kg)   Body mass index is 28.65 kg/m.   Physical Exam    Constitutional: Appears well-developed and well-nourished. No distress.  HENT:  Head: Normocephalic and atraumatic.  Neck: Neck supple. No tracheal deviation present. No thyromegaly present.  No cervical lymphadenopathy Cardiovascular: Normal rate, regular rhythm and normal heart sounds.   No murmur heard. No carotid bruit .  No edema Pulmonary/Chest: Effort normal and breath sounds normal. No respiratory distress. No has no wheezes. No rales.  Skin: Skin is warm and dry. Not diaphoretic.  Psychiatric: Normal mood and affect. Behavior is normal.    Diabetic Foot Exam - Simple   Simple Foot Form Diabetic Foot exam was performed with the following findings: Yes 06/21/2020  2:38 PM  Visual Inspection No deformities, no ulcerations, no other skin breakdown bilaterally: Yes See comments: Yes Sensation Testing Intact to touch and monofilament testing bilaterally: Yes Pulse Check Posterior Tibialis and Dorsalis pulse intact bilaterally: Yes Comments Onychomycosis left first toe nail, right second with mild thickening      Assessment & Plan:    See  Problem List for Assessment and Plan of chronic medical problems.    This visit occurred during the SARS-CoV-2 public health emergency.  Safety protocols were in place, including screening questions prior to the visit, additional usage of staff PPE, and extensive cleaning of exam room while observing appropriate contact time as indicated for disinfecting solutions.

## 2020-06-20 NOTE — Patient Instructions (Addendum)
    Blood work was ordered.      Medications changes include :   none     Please followup in 6 months  

## 2020-06-21 ENCOUNTER — Encounter: Payer: Self-pay | Admitting: Internal Medicine

## 2020-06-21 ENCOUNTER — Ambulatory Visit (INDEPENDENT_AMBULATORY_CARE_PROVIDER_SITE_OTHER): Payer: Medicare Other | Admitting: Internal Medicine

## 2020-06-21 ENCOUNTER — Other Ambulatory Visit: Payer: Self-pay

## 2020-06-21 ENCOUNTER — Ambulatory Visit: Payer: Medicare Other | Admitting: Internal Medicine

## 2020-06-21 VITALS — BP 138/72 | HR 94 | Temp 98.1°F | Ht 69.0 in | Wt 194.0 lb

## 2020-06-21 DIAGNOSIS — M9901 Segmental and somatic dysfunction of cervical region: Secondary | ICD-10-CM | POA: Diagnosis not present

## 2020-06-21 DIAGNOSIS — E118 Type 2 diabetes mellitus with unspecified complications: Secondary | ICD-10-CM

## 2020-06-21 DIAGNOSIS — I10 Essential (primary) hypertension: Secondary | ICD-10-CM

## 2020-06-21 DIAGNOSIS — M9903 Segmental and somatic dysfunction of lumbar region: Secondary | ICD-10-CM | POA: Diagnosis not present

## 2020-06-21 DIAGNOSIS — E039 Hypothyroidism, unspecified: Secondary | ICD-10-CM

## 2020-06-21 DIAGNOSIS — M5031 Other cervical disc degeneration,  high cervical region: Secondary | ICD-10-CM | POA: Diagnosis not present

## 2020-06-21 DIAGNOSIS — I700 Atherosclerosis of aorta: Secondary | ICD-10-CM

## 2020-06-21 DIAGNOSIS — M5431 Sciatica, right side: Secondary | ICD-10-CM | POA: Diagnosis not present

## 2020-06-21 DIAGNOSIS — E782 Mixed hyperlipidemia: Secondary | ICD-10-CM

## 2020-06-21 DIAGNOSIS — K219 Gastro-esophageal reflux disease without esophagitis: Secondary | ICD-10-CM | POA: Diagnosis not present

## 2020-06-21 DIAGNOSIS — R42 Dizziness and giddiness: Secondary | ICD-10-CM | POA: Diagnosis not present

## 2020-06-21 LAB — LIPID PANEL
Cholesterol: 118 mg/dL (ref 0–200)
HDL: 33.5 mg/dL — ABNORMAL LOW (ref >39.00)
LDL Cholesterol: 53 mg/dL (ref 0–99)
NonHDL: 84.91
Total CHOL/HDL Ratio: 4
Triglycerides: 160 mg/dL — ABNORMAL HIGH (ref 0.0–149.0)
VLDL: 32 mg/dL (ref 0.0–40.0)

## 2020-06-21 LAB — TSH: TSH: 3.26 u[IU]/mL (ref 0.35–4.50)

## 2020-06-21 LAB — COMPREHENSIVE METABOLIC PANEL
ALT: 17 U/L (ref 0–53)
AST: 23 U/L (ref 0–37)
Albumin: 4.1 g/dL (ref 3.5–5.2)
Alkaline Phosphatase: 61 U/L (ref 39–117)
BUN: 31 mg/dL — ABNORMAL HIGH (ref 6–23)
CO2: 29 mEq/L (ref 19–32)
Calcium: 9.7 mg/dL (ref 8.4–10.5)
Chloride: 102 mEq/L (ref 96–112)
Creatinine, Ser: 1.34 mg/dL (ref 0.40–1.50)
GFR: 54.78 mL/min — ABNORMAL LOW (ref >60.00)
Glucose, Bld: 104 mg/dL — ABNORMAL HIGH (ref 70–99)
Potassium: 4.1 mEq/L (ref 3.5–5.1)
Sodium: 139 mEq/L (ref 135–145)
Total Bilirubin: 0.3 mg/dL (ref 0.2–1.2)
Total Protein: 6.7 g/dL (ref 6.0–8.3)

## 2020-06-21 LAB — HEMOGLOBIN A1C: Hgb A1c MFr Bld: 6.8 % — ABNORMAL HIGH (ref 4.6–6.5)

## 2020-06-21 MED ORDER — METFORMIN HCL ER 500 MG PO TB24: ORAL_TABLET | ORAL | 3 refills | Status: DC

## 2020-06-21 MED ORDER — LOSARTAN POTASSIUM-HCTZ 100-12.5 MG PO TABS: 1.0000 | ORAL_TABLET | Freq: Every day | ORAL | 3 refills | Status: DC

## 2020-06-21 MED ORDER — PANTOPRAZOLE SODIUM 40 MG PO TBEC: 40.0000 mg | DELAYED_RELEASE_TABLET | Freq: Every day | ORAL | 3 refills | Status: DC

## 2020-06-21 MED ORDER — ATORVASTATIN CALCIUM 10 MG PO TABS: 1.0000 | ORAL_TABLET | Freq: Every day | ORAL | 3 refills | Status: DC

## 2020-06-21 MED ORDER — LEVOTHYROXINE SODIUM 100 MCG PO TABS: ORAL_TABLET | ORAL | 3 refills | Status: DC

## 2020-06-21 NOTE — Assessment & Plan Note (Addendum)
Chronic BP well controlled Continue losartan-hydrochlorothiazide 100-12.5 mg daily cmp

## 2020-06-21 NOTE — Assessment & Plan Note (Signed)
Chronic Lab Results  Component Value Date   HGBA1C 6.7 (H) 12/22/2019   Controlled Continue metformin XR 500 mg in the morning and 1000 mg in the evening

## 2020-06-21 NOTE — Assessment & Plan Note (Signed)
Chronic GERD controlled Continue pantoprazole 40 mg daily 

## 2020-06-21 NOTE — Addendum Note (Signed)
Addended by: Binnie Rail on: 06/21/2020 02:46 PM   Modules accepted: Orders

## 2020-06-21 NOTE — Addendum Note (Signed)
Addended by: Jacobo Forest on: 06/21/2020 02:49 PM   Modules accepted: Orders

## 2020-06-21 NOTE — Assessment & Plan Note (Signed)
Chronic  Clinically euthyroid Currently taking levothyroxine 100 mcg 6 days a week, 200 mcg 1 day a week Check tsh  Titrate med dose if needed 

## 2020-06-21 NOTE — Assessment & Plan Note (Addendum)
Chronic Continue atorvastatin 10 mg qd Lab Results  Component Value Date   LDLCALC 68 12/22/2019   LDL at goal  Continue increase activity/exercise Stressed healthy diet

## 2020-06-21 NOTE — Assessment & Plan Note (Signed)
Acute Intermittent - no symptoms now Likely mild vertigo monitor

## 2020-06-21 NOTE — Assessment & Plan Note (Signed)
Chronic Check lipid panel  Continue atorvastatin 10 mg daily Regular exercise and healthy diet encouraged  

## 2020-06-25 ENCOUNTER — Ambulatory Visit: Payer: Medicare Other

## 2020-06-25 NOTE — Progress Notes (Deleted)
Chronic Care Management Pharmacy Note  06/25/2020 Name:  [name] MRN:  [id number] DOB:  03/13/1950  Summary:   Recommendations/Changes made from today's visit:   Plan:    Subjective: [name] is an 70 y.o. year old male who is a primary patient of Burns, Claudina Lick, MD.  The CCM team was consulted for assistance with disease management and care coordination needs.    Engaged with patient face to face for initial visit in response to provider referral for pharmacy case management and/or care coordination services.   Consent to Services:  The patient was given the following information about Chronic Care Management services today, agreed to services, and gave verbal consent: 1. CCM service includes personalized support from designated clinical staff supervised by the primary care provider, including individualized plan of care and coordination with other care providers 2. 24/7 contact phone numbers for assistance for urgent and routine care needs. 3. Service will only be billed when office clinical staff spend 20 minutes or more in a month to coordinate care. 4. Only one practitioner may furnish and bill the service in a calendar month. 5.The patient may stop CCM services at any time (effective at the end of the month) by phone call to the office staff. 6. The patient will be responsible for cost sharing (co-pay) of up to 20% of the service fee (after annual deductible is met). Patient agreed to services and consent obtained.  Patient Care Team: Binnie Rail, MD as PCP - General (Internal Medicine) Belva Crome, MD as PCP - Cardiology (Cardiology) Celestia Khat, [place] (Optometry) Lenord Fellers Cleaster Corin, [facility] as Pharmacist (Pharmacist)  Recent office visits: 06/21/20 Dr Quay Burow OV: chronic f/u; GFR down, increase water intake. No med changes.  05/18/20 Dr Quay Burow OV: acute visit for cold symptoms. Sinus infection - Rx'd Augmentin  Recent consult visits: 02/19/20 Dr Ernst Bowler  (allergy): f/u allergic rhinitis. Add saline nasal spray prior to Astelin to improve efficacy.  01/04/20 urgent care: covid test - negative.  Hospital visits: None in previous 6 months   Objective:  Lab Results  Component Value Date   CREATININE 1.34 06/21/2020   BUN 31 (H) 06/21/2020   GFR 54.78 (L) 06/21/2020   GFRNONAA 71 01/30/2017   GFRAA 82 01/30/2017   NA 139 06/21/2020   K 4.1 06/21/2020   CALCIUM 9.7 06/21/2020   CO2 29 06/21/2020   GLUCOSE 104 (H) 06/21/2020    Lab Results  Component Value Date/Time   HGBA1C 6.8 (H) 06/21/2020 02:49 PM   HGBA1C 6.7 (H) 12/22/2019 08:37 AM   GFR 54.78 (L) 06/21/2020 02:49 PM   GFR 67.44 12/22/2019 08:37 AM    Last diabetic Eye exam:  Lab Results  Component Value Date/Time   HMDIABEYEEXA No Retinopathy 07/02/2019 12:00 AM    Last diabetic Foot exam: No results found for: HMDIABFOOTEX   Lab Results  Component Value Date   CHOL 118 06/21/2020   HDL 33.50 (L) 06/21/2020   LDLCALC 53 06/21/2020   TRIG 160.0 (H) 06/21/2020   CHOLHDL 4 06/21/2020    Hepatic Function Latest Ref Rng & Units 06/21/2020 12/22/2019 06/24/2019  Total Protein 6.0 - 8.3 g/dL 6.7 6.8 7.1  Albumin 3.5 - 5.2 g/dL 4.1 4.3 4.3  AST 0 - 37 U/L _0 ALT 0 - 53 U/L _1 Alk Phosphatase 39 - 117 U/L 61 62 57  Total Bilirubin 0.2 - 1.2 mg/dL 0.3 0.5 0.5    Lab  Results  Component Value Date/Time   TSH 3.26 06/21/2020 02:49 PM   TSH 5.33 (H) 12/22/2019 08:37 AM    CBC Latest Ref Rng & Units 12/22/2019 12/24/2018 11/13/2018  WBC 4.0 - 10.5 K/uL 5.8 6.6 5.9  Hemoglobin 13.0 - 17.0 g/dL 13.4 14.0 13.1  Hematocrit 39.0 - 52.0 % 40.3 41.7 40.0  Platelets 150.0 - 400.0 K/uL 278.0 272.0 251.0    No results found for: VD25OH  Clinical ASCVD: Yes  - aortic atherosclerosis The ASCVD Risk score Mikey Bussing DC Jr., et al., 2013) failed to calculate for the following reasons:   The valid total cholesterol range is 130 to 320 mg/dL    Depression screen [facility]  2/9 06/24/2019 12/24/2018 06/28/2018  Decreased Interest 0 0 0  Down, Depressed, Hopeless 0 0 0  PHQ - 2 Score 0 0 0      Social History   Tobacco Use  Smoking Status Former   Pack years: 0.00   Types: Cigarettes   Quit date: 01/10/2008   Years since quitting: 12.4  Smokeless Tobacco Former   Types: Chew   BP Readings from Last 3 Encounters:  06/21/20 138/72  05/18/20 136/72  02/19/20 (!) 144/84   Pulse Readings from Last 3 Encounters:  06/21/20 94  05/18/20 71  02/19/20 68   Wt Readings from Last 3 Encounters:  06/21/20 194 lb (88 kg)  05/18/20 199 lb (90.3 kg)  02/19/20 195 lb 8.8 oz (88.7 kg)   BMI Readings from Last 3 Encounters:  06/21/20 28.65 kg/m  05/18/20 29.39 kg/m  02/19/20 28.88 kg/m    Assessment/Interventions: Review of patient past medical history, allergies, medications, health status, including review of consultants reports, laboratory and other test data, was performed as part of comprehensive evaluation and provision of chronic care management services.   SDOH:  (Social Determinants of Health) assessments and interventions performed: Yes  SDOH Screenings   Alcohol Screen: Not on file  Depression (PHQ2-9): Not on file  Financial Resource Strain: Not on file  Food Insecurity: Not on file  Housing: Not on file  Physical Activity: Not on file  Social Connections: Not on file  Stress: Not on file  Tobacco Use: Medium Risk   Smoking Tobacco Use: Former   Smokeless Tobacco Use: Former  Transport planner Needs: Not on file    [place]  Allergies  Allergen Reactions   Morphine Other (See Comments)    Severe headache Severe headache Severe headache Severe headache    Medications Reviewed Today     Reviewed by Marcina Millard, CMA (Certified Medical Assistant) on 06/21/20 at [place] List Status: <None>   Medication Order Taking? Sig Documenting Provider Last Dose Status Informant  Ascorbic Acid (VITAMIN C WITH ROSE HIPS) 500 MG  tablet [id number] Yes Take 1,000 mg by mouth daily. [provider] Taking Active Self  atorvastatin (LIPITOR) 10 MG tablet [id number] Yes Take 1 tablet by mouth once daily Burns, Claudina Lick, MD Taking Active   azelastine (ASTELIN) 0.1 % nasal spray [id number] Yes 1-2 sprays per nostril twice daily for runny nose Valentina Shaggy, MD Taking Active   Azelastine HCl 0.15 % SOLN [id number] Yes Place 1-2 sprays into both nostrils 2 (two) times daily as needed. Bobbitt, [place] Muta, MD Taking Active    Patient not taking:   Discontinued 11/06/19 1902   B Complex-C (SUPER B COMPLEX PO) [id number] Yes Take 1 tablet by mouth daily. [provider] Taking Active Self  Calcium Carbonate-Vitamin D (  CALCIUM-VITAMIN D3 PO) [id number] Yes Take 1 tablet by mouth every other day. [provider] Taking Active Self  Cholecalciferol (VITAMIN D3) 1000 units CAPS [id number] Yes Take 1,000 Units by mouth daily. [provider] Taking Active Self   Patient not taking:   Discontinued 80/16/55 3748   Garlic 2707 MG CAPS [id number] Yes Take 1,000 mg by mouth every other day. [provider] Taking Active Self  glucosamine-chondroitin 500-400 MG tablet [id number] Yes Take 2 tablets by mouth daily. [provider] Taking Active Self  levocetirizine (XYZAL) 5 MG tablet [id number] Yes Take 10 mg by mouth 2 (two) times daily. [provider] Taking Active Self  levothyroxine (SYNTHROID) 100 MCG tablet [id number] Yes Take 1 tablet by mouth daily before breakfast 6 days a week.  Take 2 tablets by mouth daily before breakfast once a week. Binnie Rail, MD Taking Active   losartan-hydrochlorothiazide [facility]) 100-12.5 MG tablet [id number] Yes Take 1 tablet by mouth daily after breakfast. Binnie Rail, MD Taking Active   Magnesium 100 MG TABS [id number] Yes Take 100 mg by mouth every other day. [provider] Taking Active Self  Discontinued 06/21/20 1403 (Error)    metFORMIN (GLUCOPHAGE-XR) 500 MG 24 hr tablet [id number] Yes Take 1 tablet (500 mg) in the morning and 2 tablets (1000 mg) at night Binnie Rail, MD Taking Active   Multiple Vitamins-Minerals (MULTIVITAMIN ADULTS 50+ PO) [id number] Yes Take 1 tablet by mouth daily. [provider] Taking Active Self  Discontinued 06/21/20 1402 (Error) pantoprazole (PROTONIX) 40 MG tablet [id number] Yes Take 1 tablet (40 mg total) by mouth daily. Binnie Rail, MD Taking Active   vitamin E 400 UNIT capsule [id number] Yes Take 400 Units by mouth every other day. 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   Gout 06/28/2018   Hallux rigidus of right foot 08/30/2017   Hypertension 06/26/2017   OSA (obstructive sleep apnea) 06/26/2017   Tobacco abuse, in remission 06/26/2017   Type 2 diabetes mellitus with complication, without long-term current use of insulin ([place]) 06/22/2016   Hypothyroidism 06/22/2016   Hyperlipidemia 06/22/2016   GERD (gastroesophageal reflux disease) 06/22/2016   Status post cervical spinal fusion 05/23/2016   Cervical spinal stenosis 04/05/2016   Pain in right wrist 03/07/2016   Neck pain 12/01/2015   Other spondylosis with radiculopathy, cervical region 12/01/2015   Diverticulitis of colon with perforation 10/20/2010    Immunization History  Administered Date(s) Administered   Influenza, High Dose Seasonal PF 10/12/2017, 11/06/2019   Influenza,inj,Quad  PF,6+ Mos 10/11/2018   PFIZER(Purple Top)SARS-COV-2 Vaccination 05/06/2019, 05/26/2019, 12/25/2019   Pneumococcal Conjugate-13 12/24/2018   Pneumococcal Polysaccharide-23 11/12/2017   Tdap 06/27/2017   Zoster Recombinat (Shingrix) 12/27/2017, 03/28/2018    Conditions to be addressed/monitored:  Hypertension, Hyperlipidemia, Diabetes, Coronary Artery Disease, GERD, Chronic Kidney Disease, Hypothyroidism, and Allergic Rhinitis  There are no care plans that you recently modified to display for this patient.    Medication Assistance: {MEDASSISTANCEINFO:25044}  Compliance/Adherence/Medication fill history:  Care Gaps: COVID booster (due 04/24/20)  Star-Rating Drugs: Atorvastatin - LF 06/07/20 x 90 ds Losartan-HCTZ - LF 04/27/20 x 90 ds Metformin - LF 05/24/20 x 90 ds  Patient's preferred pharmacy is:  [facility], [place] [address] Phone: [phone] Fax: [phone]  Uses pill box? {Yes or If no, why not?:20788} Pt endorses ***% compliance  We discussed: {Pharmacy options:24294} Patient decided to: {US Pharmacy Plan:23885}  Care Plan and Follow Up Patient Decision:  {FOLLOWUP:24991}  Plan: {CM FOLLOW UP TZGY:17494}      Current Barriers:  {pharmacybarriers:24917}  Pharmacist Clinical Goal(s):  Patient will {PHARMACYGOALCHOICES:24921} through collaboration with PharmD and provider.   Interventions: 1:1 collaboration with Binnie Rail, MD regarding development and update of comprehensive plan of care as evidenced by provider attestation and co-signature Inter-disciplinary care team collaboration (see longitudinal plan of care) Comprehensive medication review performed; medication list updated in electronic medical record  Hypertension (BP goal <140/90) -{US controlled/uncontrolled:25276} -hx OSA on CPAP, hx poor compliance -Current treatment: Losartan-HCTZ 100-12.5 mg  daily -Medications previously tried: ***  -Current home readings: *** -Current dietary habits: *** -Current exercise habits: *** -{ACTIONS;DENIES/REPORTS:21021675} hypotensive/hypertensive symptoms -Educated on {CCM BP Counseling:25124} -Counseled to monitor BP at home ***, document, and provide log at future appointments -{CCMPHARMDINTERVENTION:25122}  Hyperlipidemia: (LDL goal < 70) -{US controlled/uncontrolled:25276} -hx aortic atherosclerosis -Current treatment: Atorvastatin 10 mg daily -Medications previously tried: ***  -Current dietary patterns: *** -Current exercise habits: *** -Educated on {CCM HLD Counseling:25126} -{CCMPHARMDINTERVENTION:25122}  Diabetes (A1c goal <7%) -{US controlled/uncontrolled:25276} -Current medications: Metformin ER 500 mg - 1 tab AM, 2 tab PM -Medications previously tried: ***  -Current home glucose readings fasting glucose: *** post prandial glucose: *** -{ACTIONS;DENIES/REPORTS:21021675} hypoglycemic/hyperglycemic symptoms -Current meal patterns:  breakfast: ***  lunch: ***  dinner: *** snacks: *** drinks: *** -Current exercise: *** -Educated on {CCM DM COUNSELING:25123} -Counseled to check feet daily and get yearly eye exams -{CCMPHARMDINTERVENTION:25122}  GERD (Goal: manage symptoms) -{US controlled/uncontrolled:25276} -Current treatment  Pantoprazole 40 mg daily -Medications previously tried: ***  -{CCMPHARMDINTERVENTION:25122}  Hypothyroidism (Goal: maintain TSH in goal range) -{US controlled/uncontrolled:25276} -Current treatment  Levothyroxine 100 mg - 1 tab daily except 2 tab once a week -Medications previously tried: ***  -{CCMPHARMDINTERVENTION:25122}  Allergic rhinitis (Goal: manage symptoms) -{US controlled/uncontrolled:25276} -Current treatment  Azelastine 0.1% nasal spray Levocetirizine 5 mg - 2 tab BID Saline nasal spray -Medications previously tried: nasal steroids   -{CCMPHARMDINTERVENTION:25122}  Health Maintenance -Vaccine gaps: covid booster -Current therapy:  Garlic 4967 mg Glucosamine-chondroitin 500-400 mg - 2 tab daily Vitamin C B Complex Calcium-Vitamin D Vitamin D Magnesium  Multivitamin Vitamin E -Educated on {ccm supplement counseling:25128} -{CCM Patient satisfied:25129} -{CCMPHARMDINTERVENTION:25122}  Patient Goals/Self-Care Activities Patient will:  - {pharmacypatientgoals:24919}

## 2020-06-28 ENCOUNTER — Other Ambulatory Visit: Payer: Self-pay | Admitting: Internal Medicine

## 2020-06-30 ENCOUNTER — Other Ambulatory Visit: Payer: Self-pay | Admitting: Internal Medicine

## 2020-07-05 DIAGNOSIS — M5031 Other cervical disc degeneration,  high cervical region: Secondary | ICD-10-CM | POA: Diagnosis not present

## 2020-07-05 DIAGNOSIS — M9901 Segmental and somatic dysfunction of cervical region: Secondary | ICD-10-CM | POA: Diagnosis not present

## 2020-07-05 DIAGNOSIS — M5431 Sciatica, right side: Secondary | ICD-10-CM | POA: Diagnosis not present

## 2020-07-05 DIAGNOSIS — M9903 Segmental and somatic dysfunction of lumbar region: Secondary | ICD-10-CM | POA: Diagnosis not present

## 2020-07-08 ENCOUNTER — Telehealth: Payer: Self-pay | Admitting: Pharmacist

## 2020-07-08 NOTE — Progress Notes (Signed)
    Chronic Care Management Pharmacy Assistant   Name: [name]  MRN: [id number] DOB: [birth year]  Reason for Encounter: Chart Review   Reviewed chart for medication changes and adherence.  Recent OV, Consult or Hospital visit:  Dr. Quay Burow 06/21/20 Recent medication changes indicated:  D/c Omega 3, Meloxicam & Amoxicillin  No gaps in adherence identified. Patient has follow up scheduled with pharmacy team. No further action required.   No gaps in adherence identified. Patient has follow up scheduled with pharmacy team. No further action required.   Orinda Kenner, [place] Clinical Pharmacists Assistant [phone]  Time Spent: [phone]

## 2020-07-15 DIAGNOSIS — M9903 Segmental and somatic dysfunction of lumbar region: Secondary | ICD-10-CM | POA: Diagnosis not present

## 2020-07-15 DIAGNOSIS — M5431 Sciatica, right side: Secondary | ICD-10-CM | POA: Diagnosis not present

## 2020-07-15 DIAGNOSIS — M9901 Segmental and somatic dysfunction of cervical region: Secondary | ICD-10-CM | POA: Diagnosis not present

## 2020-07-15 DIAGNOSIS — M5031 Other cervical disc degeneration,  high cervical region: Secondary | ICD-10-CM | POA: Diagnosis not present

## 2020-07-26 DIAGNOSIS — M9903 Segmental and somatic dysfunction of lumbar region: Secondary | ICD-10-CM | POA: Diagnosis not present

## 2020-07-26 DIAGNOSIS — M5031 Other cervical disc degeneration,  high cervical region: Secondary | ICD-10-CM | POA: Diagnosis not present

## 2020-07-26 DIAGNOSIS — M9901 Segmental and somatic dysfunction of cervical region: Secondary | ICD-10-CM | POA: Diagnosis not present

## 2020-07-26 DIAGNOSIS — M5431 Sciatica, right side: Secondary | ICD-10-CM | POA: Diagnosis not present

## 2020-08-02 ENCOUNTER — Other Ambulatory Visit: Payer: Self-pay | Admitting: Internal Medicine

## 2020-08-04 ENCOUNTER — Ambulatory Visit: Payer: Medicare Other

## 2020-08-09 DIAGNOSIS — M5031 Other cervical disc degeneration,  high cervical region: Secondary | ICD-10-CM | POA: Diagnosis not present

## 2020-08-09 DIAGNOSIS — M9903 Segmental and somatic dysfunction of lumbar region: Secondary | ICD-10-CM | POA: Diagnosis not present

## 2020-08-09 DIAGNOSIS — M9901 Segmental and somatic dysfunction of cervical region: Secondary | ICD-10-CM | POA: Diagnosis not present

## 2020-08-09 DIAGNOSIS — M5431 Sciatica, right side: Secondary | ICD-10-CM | POA: Diagnosis not present

## 2020-08-23 DIAGNOSIS — M5031 Other cervical disc degeneration,  high cervical region: Secondary | ICD-10-CM | POA: Diagnosis not present

## 2020-08-23 DIAGNOSIS — M9901 Segmental and somatic dysfunction of cervical region: Secondary | ICD-10-CM | POA: Diagnosis not present

## 2020-08-23 DIAGNOSIS — M5431 Sciatica, right side: Secondary | ICD-10-CM | POA: Diagnosis not present

## 2020-08-23 DIAGNOSIS — M9903 Segmental and somatic dysfunction of lumbar region: Secondary | ICD-10-CM | POA: Diagnosis not present

## 2020-09-03 ENCOUNTER — Other Ambulatory Visit: Payer: Self-pay | Admitting: Internal Medicine

## 2020-09-06 DIAGNOSIS — M9901 Segmental and somatic dysfunction of cervical region: Secondary | ICD-10-CM | POA: Diagnosis not present

## 2020-09-06 DIAGNOSIS — M5431 Sciatica, right side: Secondary | ICD-10-CM | POA: Diagnosis not present

## 2020-09-06 DIAGNOSIS — M5031 Other cervical disc degeneration,  high cervical region: Secondary | ICD-10-CM | POA: Diagnosis not present

## 2020-09-06 DIAGNOSIS — M9903 Segmental and somatic dysfunction of lumbar region: Secondary | ICD-10-CM | POA: Diagnosis not present

## 2020-09-08 DIAGNOSIS — H402212 Chronic angle-closure glaucoma, right eye, moderate stage: Secondary | ICD-10-CM | POA: Diagnosis not present

## 2020-09-08 DIAGNOSIS — E119 Type 2 diabetes mellitus without complications: Secondary | ICD-10-CM | POA: Diagnosis not present

## 2020-09-08 DIAGNOSIS — H402221 Chronic angle-closure glaucoma, left eye, mild stage: Secondary | ICD-10-CM | POA: Diagnosis not present

## 2020-09-08 DIAGNOSIS — H25813 Combined forms of age-related cataract, bilateral: Secondary | ICD-10-CM | POA: Diagnosis not present

## 2020-09-08 DIAGNOSIS — H353131 Nonexudative age-related macular degeneration, bilateral, early dry stage: Secondary | ICD-10-CM | POA: Diagnosis not present

## 2020-09-08 LAB — HM DIABETES EYE EXAM

## 2020-09-16 ENCOUNTER — Encounter: Payer: Self-pay | Admitting: Internal Medicine

## 2020-09-16 NOTE — Progress Notes (Signed)
Outside notes received. Information abstracted. Notes sent to scan.  

## 2020-09-20 DIAGNOSIS — M9901 Segmental and somatic dysfunction of cervical region: Secondary | ICD-10-CM | POA: Diagnosis not present

## 2020-09-20 DIAGNOSIS — M5431 Sciatica, right side: Secondary | ICD-10-CM | POA: Diagnosis not present

## 2020-09-20 DIAGNOSIS — M9903 Segmental and somatic dysfunction of lumbar region: Secondary | ICD-10-CM | POA: Diagnosis not present

## 2020-09-20 DIAGNOSIS — M5031 Other cervical disc degeneration,  high cervical region: Secondary | ICD-10-CM | POA: Diagnosis not present

## 2020-10-02 ENCOUNTER — Other Ambulatory Visit: Payer: Self-pay | Admitting: Internal Medicine

## 2020-10-04 DIAGNOSIS — M9901 Segmental and somatic dysfunction of cervical region: Secondary | ICD-10-CM | POA: Diagnosis not present

## 2020-10-04 DIAGNOSIS — M5031 Other cervical disc degeneration,  high cervical region: Secondary | ICD-10-CM | POA: Diagnosis not present

## 2020-10-04 DIAGNOSIS — M5431 Sciatica, right side: Secondary | ICD-10-CM | POA: Diagnosis not present

## 2020-10-04 DIAGNOSIS — M9903 Segmental and somatic dysfunction of lumbar region: Secondary | ICD-10-CM | POA: Diagnosis not present

## 2020-10-06 ENCOUNTER — Telehealth: Payer: Self-pay | Admitting: Internal Medicine

## 2020-10-06 NOTE — Telephone Encounter (Signed)
Left message for patient to call me back at [phone] to schedule Medicare Annual Wellness Visit   Last AWV  06/24/19  Please schedule at anytime with [facility] if patient calls the office back.    40 Minutes appointment   Any questions, please call me at [phone]

## 2020-10-18 DIAGNOSIS — M5031 Other cervical disc degeneration,  high cervical region: Secondary | ICD-10-CM | POA: Diagnosis not present

## 2020-10-18 DIAGNOSIS — M9901 Segmental and somatic dysfunction of cervical region: Secondary | ICD-10-CM | POA: Diagnosis not present

## 2020-10-18 DIAGNOSIS — M5431 Sciatica, right side: Secondary | ICD-10-CM | POA: Diagnosis not present

## 2020-10-18 DIAGNOSIS — M9903 Segmental and somatic dysfunction of lumbar region: Secondary | ICD-10-CM | POA: Diagnosis not present

## 2020-10-28 ENCOUNTER — Other Ambulatory Visit: Payer: Self-pay | Admitting: Internal Medicine

## 2020-11-01 DIAGNOSIS — M9901 Segmental and somatic dysfunction of cervical region: Secondary | ICD-10-CM | POA: Diagnosis not present

## 2020-11-01 DIAGNOSIS — M9903 Segmental and somatic dysfunction of lumbar region: Secondary | ICD-10-CM | POA: Diagnosis not present

## 2020-11-01 DIAGNOSIS — M5431 Sciatica, right side: Secondary | ICD-10-CM | POA: Diagnosis not present

## 2020-11-01 DIAGNOSIS — M5031 Other cervical disc degeneration,  high cervical region: Secondary | ICD-10-CM | POA: Diagnosis not present

## 2020-11-08 ENCOUNTER — Ambulatory Visit: Payer: Medicare Other

## 2020-11-23 DIAGNOSIS — M5431 Sciatica, right side: Secondary | ICD-10-CM | POA: Diagnosis not present

## 2020-11-23 DIAGNOSIS — M5031 Other cervical disc degeneration,  high cervical region: Secondary | ICD-10-CM | POA: Diagnosis not present

## 2020-11-23 DIAGNOSIS — M9903 Segmental and somatic dysfunction of lumbar region: Secondary | ICD-10-CM | POA: Diagnosis not present

## 2020-11-23 DIAGNOSIS — M9901 Segmental and somatic dysfunction of cervical region: Secondary | ICD-10-CM | POA: Diagnosis not present

## 2020-11-29 DIAGNOSIS — Z23 Encounter for immunization: Secondary | ICD-10-CM | POA: Diagnosis not present

## 2020-11-29 IMAGING — CT CT CHEST LUNG CANCER SCREENING LOW DOSE W/O CM
2 of 4 series · 15 of 36 positions shown, 18 images · non-contrast
Comparison: 07/18/2018

CLINICAL DATA: Lung cancer screening. Eighty-six pack-year history.
Former asymptomatic smoker.

EXAM:
CT CHEST WITHOUT CONTRAST LOW-DOSE FOR LUNG CANCER SCREENING
TECHNIQUE: Multidetector CT imaging of the chest was performed following the
standard protocol without IV contrast.

[Series 3: lungs · axial · 0.73mm/px · z∈[-291,-25]mm · 12 of 294 slices shown, 15 images]
[im 14/294  mediastinal]
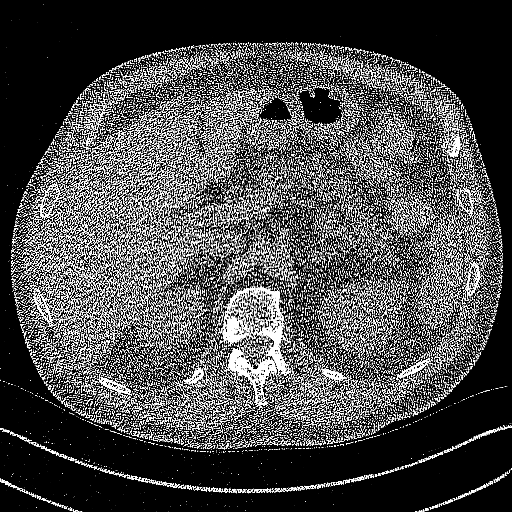
[im 14/294  lung]
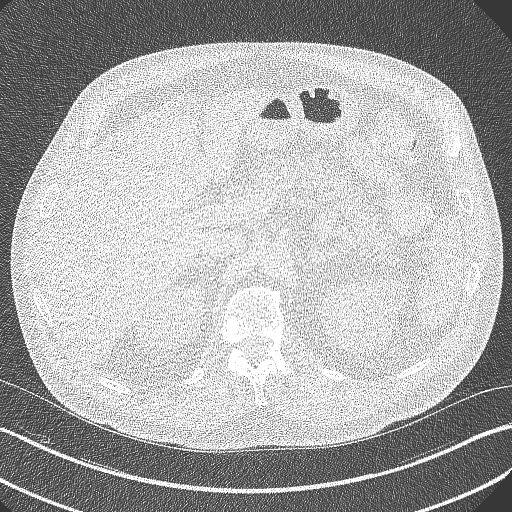
[im 40/294  lung]
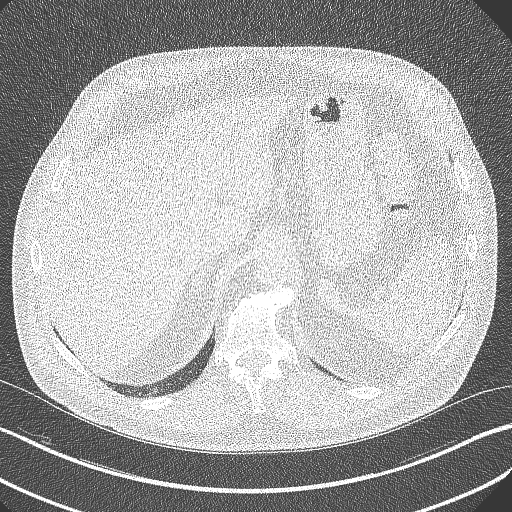
[im 67/294  lung]
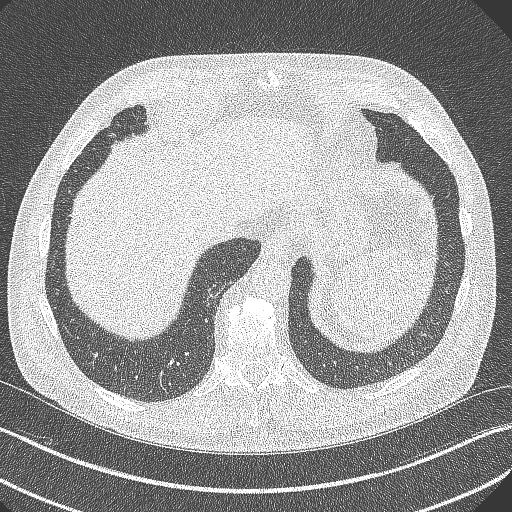
[im 94/294  lung]
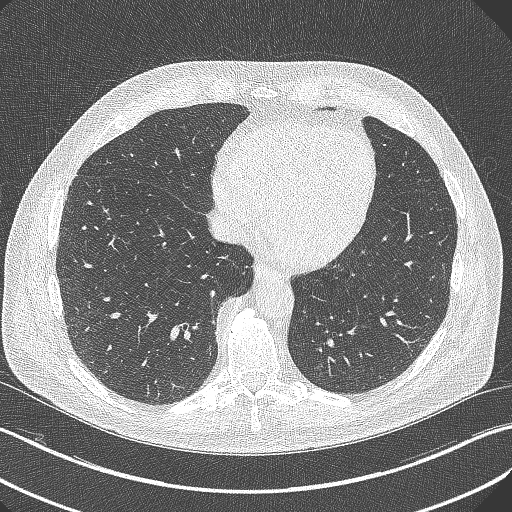
[im 107/294  mediastinal]
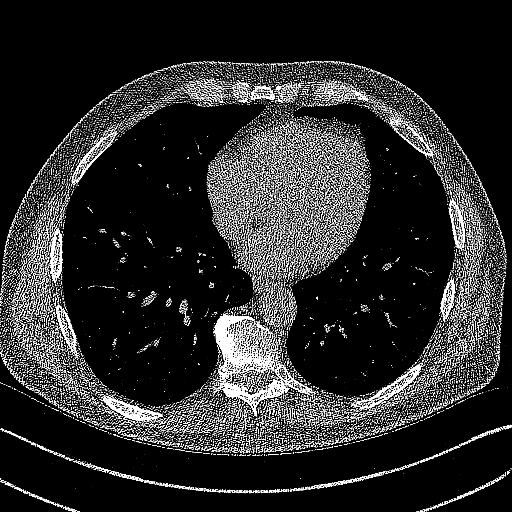
[im 107/294  lung]
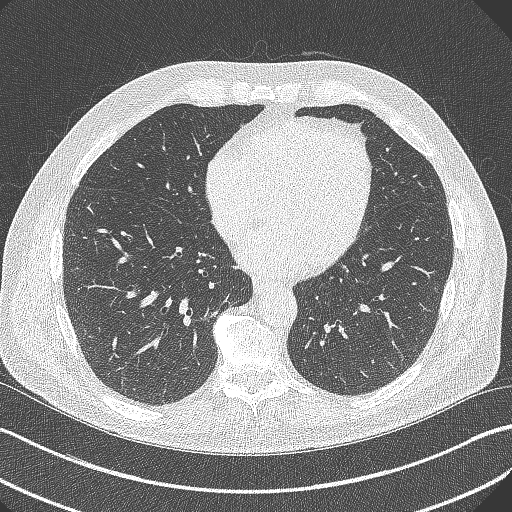
[im 134/294  lung]
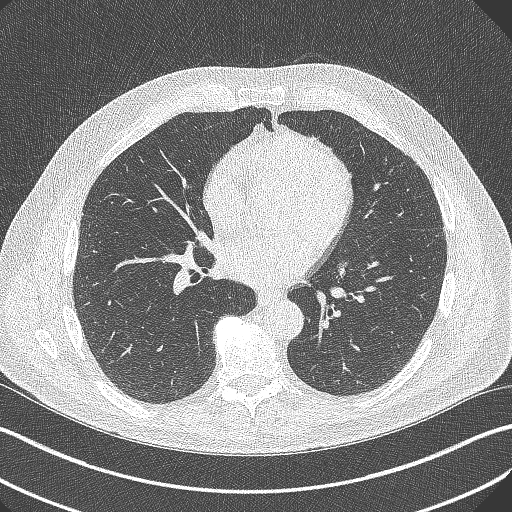
[im 160/294  lung]
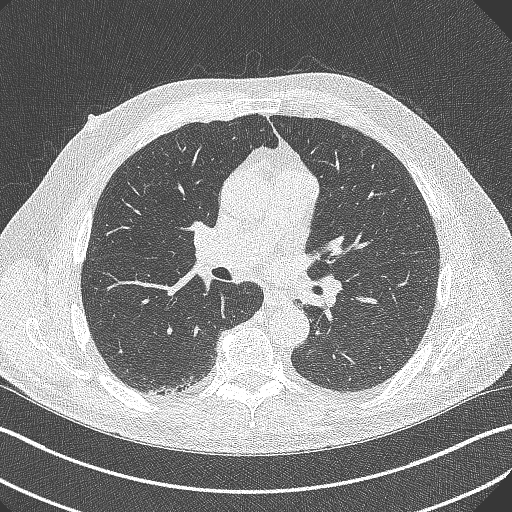
[im 187/294  lung]
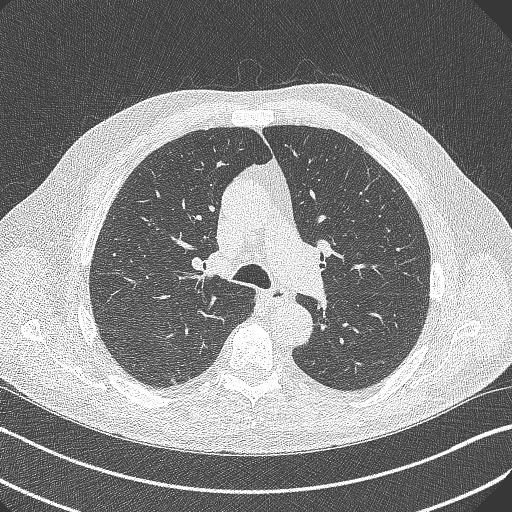
[im 200/294  mediastinal]
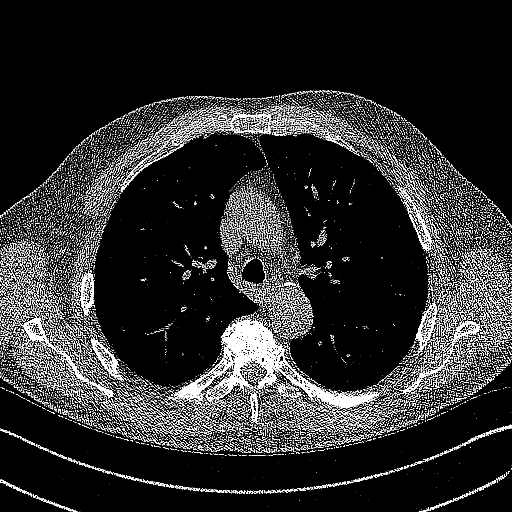
[im 200/294  lung]
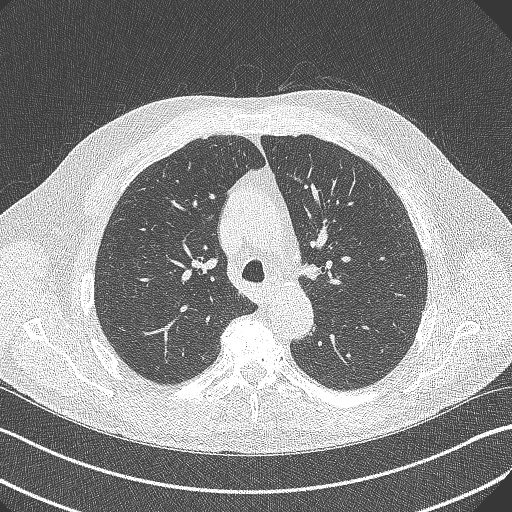
[im 227/294  lung]
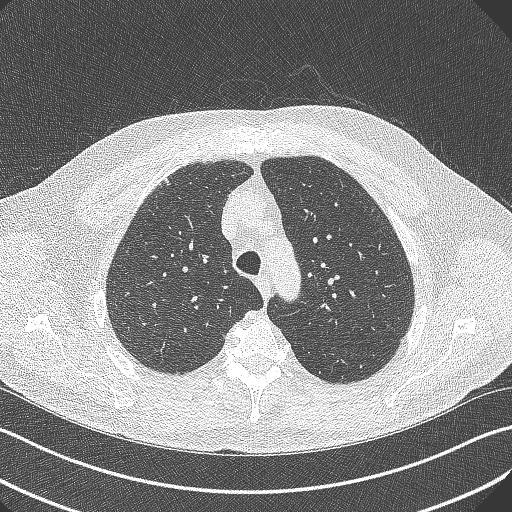
[im 254/294  lung]
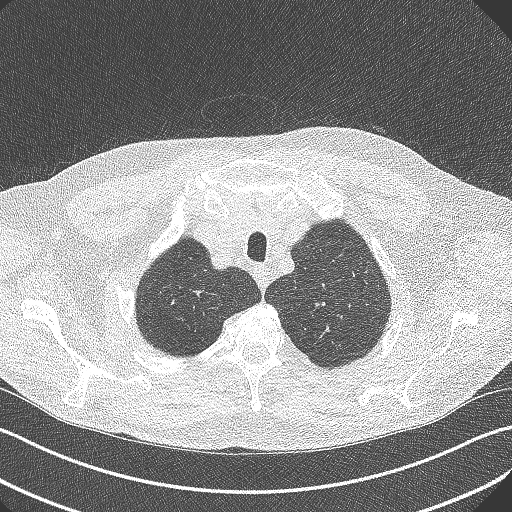
[im 280/294  lung]
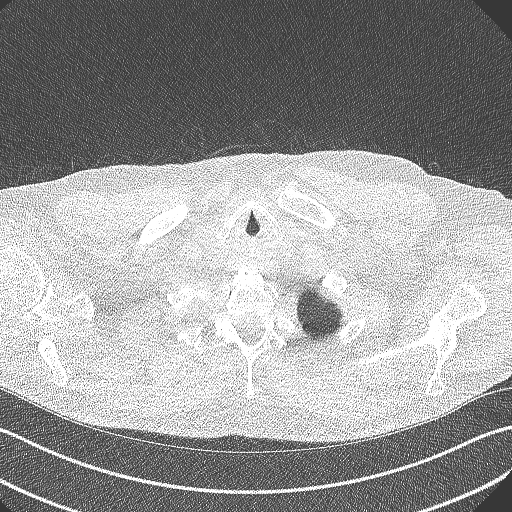

[Series 5: coronal · coronal · 0.62mm/px · 3 of 269 slices shown]
[im 54/269  lung]
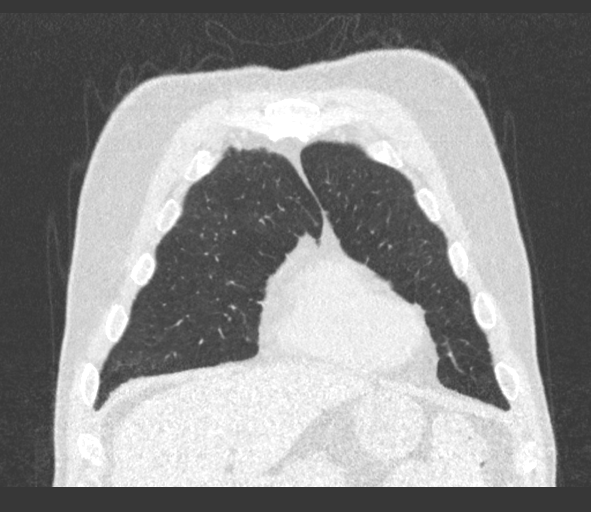
[im 108/269  lung]
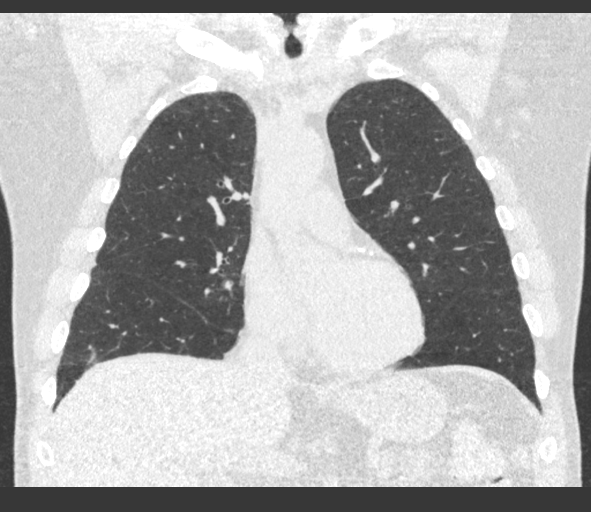
[im 161/269  lung]
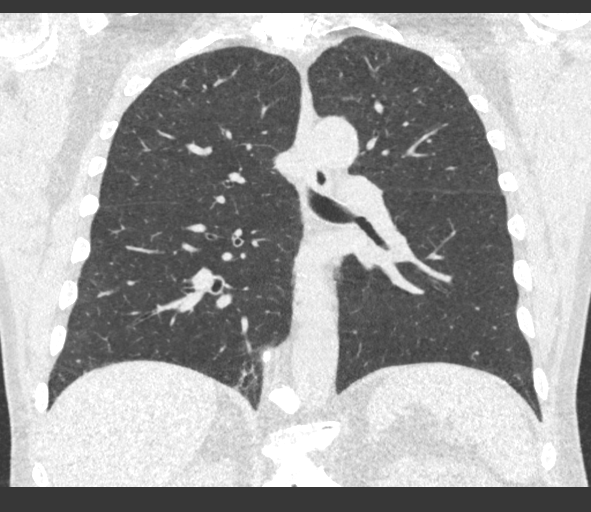

[15 of 36 positions shown; findings below may reference images not displayed]

FINDINGS: Cardiovascular: The heart size appears within normal limits. Aortic
atherosclerosis. Lad coronary artery atherosclerotic calcification.

Mediastinum/Nodes: Normal appearance of the thyroid gland. The
trachea appears patent and is midline. Normal appearance of the
esophagus. No axillary or supraclavicular adenopathy. No mediastinal
or hilar adenopathy.

Lungs/Pleura: Mild centrilobular emphysema. Calcified and
noncalcified lung nodules are again noted. The solitary noncalcified
lung nodule is in the subpleural aspect of the posterior right
hepatic lobe with a derived mean diameter of 4.8 mm. These are not
significantly changed compared with previous exam.

Upper Abdomen: No acute abnormality identified within the imaged
portions of the upper abdomen. Similar appearance of low-attenuation
structure adjacent to falciform ligament within the medial segment
of left lobe compatible with cysts.

Musculoskeletal: Multi level spondylosis identified within the
thoracic spine. Previous ACDF. No acute or suspicious osseous
findings.
IMPRESSION: 1. Lung-RADS 2, benign appearance or behavior. Continue annual
screening with low-dose chest CT without contrast in 12 months.
2. Coronary artery calcifications.
3. Aortic atherosclerosis.

Aortic Atherosclerosis ([id number]).

## 2020-12-05 ENCOUNTER — Other Ambulatory Visit: Payer: Self-pay | Admitting: Internal Medicine

## 2020-12-13 DIAGNOSIS — M9903 Segmental and somatic dysfunction of lumbar region: Secondary | ICD-10-CM | POA: Diagnosis not present

## 2020-12-13 DIAGNOSIS — M5431 Sciatica, right side: Secondary | ICD-10-CM | POA: Diagnosis not present

## 2020-12-13 DIAGNOSIS — M5031 Other cervical disc degeneration,  high cervical region: Secondary | ICD-10-CM | POA: Diagnosis not present

## 2020-12-13 DIAGNOSIS — M9901 Segmental and somatic dysfunction of cervical region: Secondary | ICD-10-CM | POA: Diagnosis not present

## 2020-12-26 ENCOUNTER — Encounter: Payer: Self-pay | Admitting: Internal Medicine

## 2020-12-26 ENCOUNTER — Other Ambulatory Visit: Payer: Self-pay | Admitting: Internal Medicine

## 2020-12-26 NOTE — Progress Notes (Signed)
Subjective:    Patient ID: [name], male    DOB: Nov 17, 1950, 70 y.o.   MRN: [id number]  This visit occurred during the SARS-CoV-2 public health emergency.  Safety protocols were in place, including screening questions prior to the visit, additional usage of staff PPE, and extensive cleaning of exam room while observing appropriate contact time as indicated for disinfecting solutions.     HPI The patient is here for follow up of their chronic medical problems, including DM, htn, hld, hypothyroid, gerd, gout  He continues to have hand OA.  He has been taking the meloxicam on a daily basis.  It does help.  His blood pressure is typically lower than here at home-typically the top numbers in the 130s.  Medications and allergies reviewed with patient and updated if appropriate.  Patient Active Problem List   Diagnosis Date Noted   Aortic atherosclerosis ([place]) 06/21/2020   Dizziness 06/21/2020   Generalized osteoarthritis of hand 12/22/2019   Acquired trigger finger of right ring finger 08/20/2019   Environmental allergies 01/13/2019   Cough 12/17/2018   Allergic conjunctivitis 12/17/2018   Chronic frontal sinusitis 12/17/2018   Perennial allergic rhinitis with possible nonallergic component 76/19/5093   Eosinophilic leukocytosis 26/71/2458   Gout 06/28/2018   Hallux rigidus of right foot 08/30/2017   Hypertension 06/26/2017   OSA (obstructive sleep apnea) 06/26/2017   Tobacco abuse, in remission 06/26/2017   Type 2 diabetes mellitus with complication, without long-term current use of insulin ([place]) 06/22/2016   Hypothyroidism 06/22/2016   Hyperlipidemia 06/22/2016   GERD (gastroesophageal reflux disease) 06/22/2016   Status post cervical spinal fusion 05/23/2016   Cervical spinal stenosis 04/05/2016   Pain in right wrist 03/07/2016   Neck pain 12/01/2015   Other spondylosis with radiculopathy, cervical region 12/01/2015   Diverticulitis of colon with perforation  10/20/2010    Current Outpatient Medications on File Prior to Visit  Medication Sig Dispense Refill   Ascorbic Acid (VITAMIN C WITH ROSE HIPS) 500 MG tablet Take 1,000 mg by mouth daily.     atorvastatin (LIPITOR) 10 MG tablet Take 1 tablet (10 mg total) by mouth daily. 90 tablet 3   azelastine (ASTELIN) 0.1 % nasal spray 1-2 sprays per nostril twice daily for runny nose 30 mL 11   Azelastine HCl 0.15 % SOLN Place 1-2 sprays into both nostrils 2 (two) times daily as needed. 30 mL 5   B Complex-C (SUPER B COMPLEX PO) Take 1 tablet by mouth daily.     Calcium Carbonate-Vitamin D (CALCIUM-VITAMIN D3 PO) Take 1 tablet by mouth every other day.     Cholecalciferol (VITAMIN D3) 1000 units CAPS Take 1,000 Units by mouth daily.     Garlic 0998 MG CAPS Take 1,000 mg by mouth every other day.     glucosamine-chondroitin 500-400 MG tablet Take 2 tablets by mouth daily.     levocetirizine (XYZAL) 5 MG tablet Take 10 mg by mouth 2 (two) times daily.     losartan-hydrochlorothiazide (HYZAAR) 100-12.5 MG tablet Take 1 tablet by mouth daily after breakfast. 90 tablet 3   Magnesium 100 MG TABS Take 100 mg by mouth every other day.     metFORMIN (GLUCOPHAGE-XR) 500 MG 24 hr tablet Take 1 tablet (500 mg) in the morning and 2 tablets (1000 mg) at night 270 tablet 3   Multiple Vitamins-Minerals (MULTIVITAMIN ADULTS 50+ PO) Take 1 tablet by mouth daily.     pantoprazole (PROTONIX) 40 MG tablet Take  1 tablet (40 mg total) by mouth daily. 90 tablet 3   vitamin E 400 UNIT capsule Take 400 Units by mouth every other day.     [DISCONTINUED] Azelastine-Fluticasone 137-50 MCG/ACT SUSP Place 1 spray into the nose 2 (two) times daily as needed. (Patient not taking: Reported on 09/16/2019) 23 g 2   [DISCONTINUED] fluticasone (FLONASE) 50 MCG/ACT nasal spray Place 2 sprays into both nostrils daily. (Patient not taking: Reported on 09/16/2019) 1 g 5   No current facility-administered medications on file prior to visit.    Past  Medical History:  Diagnosis Date   Arthritis    Diabetes mellitus without complication (HCC)    Diverticulitis of colon    GERD (gastroesophageal reflux disease)    Headache    Hypertension    Hypothyroidism    Thyroid disease     Past Surgical History:  Procedure Laterality Date   ANKLE FRACTURE SURGERY     ANTERIOR CERVICAL DECOMP/DISCECTOMY FUSION N/A 04/05/2016   Procedure: C5-6, C6-7 Anterior Cervical Discectomy and Fusion, Allograft, Plate;  Surgeon: Marybelle Killings, MD;  Location: [place];  Service: Orthopedics;  Laterality: N/A;   ANTERIOR FUSION CERVICAL SPINE  04/05/2016   c6 c7   CARPAL TUNNEL RELEASE Bilateral    JOINT REPLACEMENT  01/07   left knee replaced    JOINT REPLACEMENT  11/10   right knee replaced    KNEE SURGERY     LEFT HEART CATH AND CORONARY ANGIOGRAPHY N/A 02/06/2017   Procedure: LEFT HEART CATH AND CORONARY ANGIOGRAPHY;  Surgeon: Belva Crome, MD;  Location: [facility];  Service: Cardiovascular;  Laterality: N/A;   LEG SURGERY  11/03   broken lower right leg crushed     Social History   Socioeconomic History   Marital status: Divorced    Spouse name: Not on file   Number of children: Not on file   Years of education: Not on file   Highest education level: Not on file  Occupational History   Not on file  Tobacco Use   Smoking status: Former    Types: Cigarettes    Quit date: 01/10/2008    Years since quitting: 12.9   Smokeless tobacco: Former    Types: Nurse, children's Use: Never used  Substance and Sexual Activity   Alcohol use: Yes    Comment: occasionally   Drug use: No   Sexual activity: Yes    Birth control/protection: None    Comment: Married  Other Topics Concern   Not on file  Social History Narrative   Not on file   Social Determinants of Health   Financial Resource Strain: Not on file  Food Insecurity: Not on file  Transportation Needs: Not on file  Physical Activity: Not on file  Stress: Not on file   Social Connections: Not on file    Family History  Problem Relation Age of Onset   Hypertension Mother    Diabetes Mother    Heart attack Father    Heart disease Father    Diabetes Paternal Grandmother    Cancer Paternal Grandfather        of eye    Review of Systems  Constitutional:  Negative for chills and fever.  Respiratory:  Negative for cough, shortness of breath and wheezing.   Cardiovascular:  Negative for chest pain, palpitations and leg swelling.  Gastrointestinal:        Jerrye Bushy fairly controlled  Musculoskeletal:        ?  Intermittent gout vs arthritis  Neurological:  Positive for headaches (sinus headaches). Negative for light-headedness.      Objective:   Vitals:   12/27/20 0809  BP: 140/88  Pulse: 60  Temp: 98 F (36.7 C)  SpO2: 98%   BP Readings from Last 3 Encounters:  12/27/20 140/88  06/21/20 138/72  05/18/20 136/72   Wt Readings from Last 3 Encounters:  12/27/20 196 lb (88.9 kg)  06/21/20 194 lb (88 kg)  05/18/20 199 lb (90.3 kg)   Body mass index is 28.94 kg/m.   Physical Exam    Constitutional: Appears well-developed and well-nourished. No distress.  HENT:  Head: Normocephalic and atraumatic.  Neck: Neck supple. No tracheal deviation present. No thyromegaly present.  No cervical lymphadenopathy Cardiovascular: Normal rate, regular rhythm and normal heart sounds.  No murmur heard. No carotid bruit .  No edema Pulmonary/Chest: Effort normal and breath sounds normal. No respiratory distress. No has no wheezes. No rales.  Skin: Skin is warm and dry. Not diaphoretic.  Psychiatric: Normal mood and affect. Behavior is normal.      Assessment & Plan:    See Problem List for Assessment and Plan of chronic medical problems.

## 2020-12-26 NOTE — Patient Instructions (Addendum)
  Blood work was ordered.     Medications changes include :   None  Your prescription(s) have been submitted to your pharmacy. Please take as directed and contact our office if you believe you are having problem(s) with the medication(s).    Please followup in 6 months   

## 2020-12-27 ENCOUNTER — Ambulatory Visit (INDEPENDENT_AMBULATORY_CARE_PROVIDER_SITE_OTHER): Payer: Medicare Other | Admitting: Internal Medicine

## 2020-12-27 ENCOUNTER — Other Ambulatory Visit: Payer: Self-pay

## 2020-12-27 VITALS — BP 140/88 | HR 60 | Temp 98.0°F | Ht 69.0 in | Wt 196.0 lb

## 2020-12-27 DIAGNOSIS — M159 Polyosteoarthritis, unspecified: Secondary | ICD-10-CM

## 2020-12-27 DIAGNOSIS — E039 Hypothyroidism, unspecified: Secondary | ICD-10-CM

## 2020-12-27 DIAGNOSIS — E782 Mixed hyperlipidemia: Secondary | ICD-10-CM | POA: Diagnosis not present

## 2020-12-27 DIAGNOSIS — K219 Gastro-esophageal reflux disease without esophagitis: Secondary | ICD-10-CM

## 2020-12-27 DIAGNOSIS — E118 Type 2 diabetes mellitus with unspecified complications: Secondary | ICD-10-CM | POA: Diagnosis not present

## 2020-12-27 DIAGNOSIS — I10 Essential (primary) hypertension: Secondary | ICD-10-CM

## 2020-12-27 DIAGNOSIS — M5431 Sciatica, right side: Secondary | ICD-10-CM | POA: Diagnosis not present

## 2020-12-27 DIAGNOSIS — M109 Gout, unspecified: Secondary | ICD-10-CM

## 2020-12-27 DIAGNOSIS — M5031 Other cervical disc degeneration,  high cervical region: Secondary | ICD-10-CM | POA: Diagnosis not present

## 2020-12-27 DIAGNOSIS — M9901 Segmental and somatic dysfunction of cervical region: Secondary | ICD-10-CM | POA: Diagnosis not present

## 2020-12-27 DIAGNOSIS — Z125 Encounter for screening for malignant neoplasm of prostate: Secondary | ICD-10-CM

## 2020-12-27 DIAGNOSIS — M9903 Segmental and somatic dysfunction of lumbar region: Secondary | ICD-10-CM | POA: Diagnosis not present

## 2020-12-27 LAB — COMPREHENSIVE METABOLIC PANEL
ALT: 17 U/L (ref 0–53)
AST: 22 U/L (ref 0–37)
Albumin: 4 g/dL (ref 3.5–5.2)
Alkaline Phosphatase: 54 U/L (ref 39–117)
BUN: 24 mg/dL — ABNORMAL HIGH (ref 6–23)
CO2: 28 mEq/L (ref 19–32)
Calcium: 9.7 mg/dL (ref 8.4–10.5)
Chloride: 103 mEq/L (ref 96–112)
Creatinine, Ser: 1.23 mg/dL (ref 0.40–1.50)
GFR: 60.49 mL/min (ref >60.00)
Glucose, Bld: 113 mg/dL — ABNORMAL HIGH (ref 70–99)
Potassium: 5 mEq/L (ref 3.5–5.1)
Sodium: 140 mEq/L (ref 135–145)
Total Bilirubin: 0.5 mg/dL (ref 0.2–1.2)
Total Protein: 6.2 g/dL (ref 6.0–8.3)

## 2020-12-27 LAB — LIPID PANEL
Cholesterol: 132 mg/dL (ref 0–200)
HDL: 46.4 mg/dL (ref >39.00)
LDL Cholesterol: 63 mg/dL (ref 0–99)
NonHDL: 85.57
Total CHOL/HDL Ratio: 3
Triglycerides: 112 mg/dL (ref 0.0–149.0)
VLDL: 22.4 mg/dL (ref 0.0–40.0)

## 2020-12-27 LAB — TSH: TSH: 3.29 u[IU]/mL (ref 0.35–5.50)

## 2020-12-27 LAB — URIC ACID: Uric Acid, Serum: 7.5 mg/dL (ref 4.0–7.8)

## 2020-12-27 LAB — HEMOGLOBIN A1C: Hgb A1c MFr Bld: 6.8 % — ABNORMAL HIGH (ref 4.6–6.5)

## 2020-12-27 LAB — PSA, MEDICARE: PSA: 0.29 ng/ml (ref 0.10–4.00)

## 2020-12-27 MED ORDER — MELOXICAM 15 MG PO TABS: 15.0000 mg | ORAL_TABLET | Freq: Every day | ORAL | 1 refills | Status: DC | PRN

## 2020-12-27 MED ORDER — LEVOTHYROXINE SODIUM 100 MCG PO TABS: 100.0000 ug | ORAL_TABLET | Freq: Every day | ORAL | 3 refills | Status: DC

## 2020-12-27 NOTE — Assessment & Plan Note (Addendum)
Chronic  Clinically euthyroid Currently taking levothyroxine 100 mcg daily-according to my records he was supposed to increase this dose to 2 pills daily once a week, but apparently has not done that Check tsh  Titrate med dose if needed

## 2020-12-27 NOTE — Assessment & Plan Note (Signed)
Chronic Continue meloxicam 15 mg daily as needed-advised to not take daily if he can get away with it He is aware of potential side effects with long-term use

## 2020-12-27 NOTE — Assessment & Plan Note (Addendum)
Chronic Blood pressure well controlled at home-slightly higher here today, but typically is lower CMP Continue Hyzaar 100-12.5 mg daily

## 2020-12-27 NOTE — Assessment & Plan Note (Signed)
Chronic GERD controlled Continue pantoprazole to 40 mg daily

## 2020-12-27 NOTE — Assessment & Plan Note (Addendum)
Chronic ? Gout flares vs OA pain Ck uric acid level Currently not on any medication - has allopurinol at home and he states he takes as needed-discussed that this should be taken daily for prevention If uric acid level is elevated would recommend that he takes the allopurinol on a daily basis again

## 2020-12-27 NOTE — Assessment & Plan Note (Signed)
Chronic Regular exercise and healthy diet encouraged Check lipid panel  Continue atorvastatin 10 mg daily 

## 2020-12-27 NOTE — Assessment & Plan Note (Addendum)
Chronic Lab Results  Component Value Date   HGBA1C 6.8 (H) 06/21/2020   Sugars have been controlled Check A1c today He does have some concerns about risks with metformin-discussed other options Continue metformin XR 500 mg in morning, 1000 mg at night Diabetic diet, regular exercise encouraged

## 2021-01-10 DIAGNOSIS — M5431 Sciatica, right side: Secondary | ICD-10-CM | POA: Diagnosis not present

## 2021-01-10 DIAGNOSIS — M5031 Other cervical disc degeneration,  high cervical region: Secondary | ICD-10-CM | POA: Diagnosis not present

## 2021-01-10 DIAGNOSIS — M9903 Segmental and somatic dysfunction of lumbar region: Secondary | ICD-10-CM | POA: Diagnosis not present

## 2021-01-10 DIAGNOSIS — M9901 Segmental and somatic dysfunction of cervical region: Secondary | ICD-10-CM | POA: Diagnosis not present

## 2021-01-24 DIAGNOSIS — M5431 Sciatica, right side: Secondary | ICD-10-CM | POA: Diagnosis not present

## 2021-01-24 DIAGNOSIS — M9901 Segmental and somatic dysfunction of cervical region: Secondary | ICD-10-CM | POA: Diagnosis not present

## 2021-01-24 DIAGNOSIS — M5031 Other cervical disc degeneration,  high cervical region: Secondary | ICD-10-CM | POA: Diagnosis not present

## 2021-01-24 DIAGNOSIS — M9903 Segmental and somatic dysfunction of lumbar region: Secondary | ICD-10-CM | POA: Diagnosis not present

## 2021-02-02 ENCOUNTER — Other Ambulatory Visit: Payer: Self-pay | Admitting: Internal Medicine

## 2021-02-07 DIAGNOSIS — M5431 Sciatica, right side: Secondary | ICD-10-CM | POA: Diagnosis not present

## 2021-02-07 DIAGNOSIS — M9901 Segmental and somatic dysfunction of cervical region: Secondary | ICD-10-CM | POA: Diagnosis not present

## 2021-02-07 DIAGNOSIS — M9903 Segmental and somatic dysfunction of lumbar region: Secondary | ICD-10-CM | POA: Diagnosis not present

## 2021-02-07 DIAGNOSIS — M5031 Other cervical disc degeneration,  high cervical region: Secondary | ICD-10-CM | POA: Diagnosis not present

## 2021-02-21 DIAGNOSIS — M5431 Sciatica, right side: Secondary | ICD-10-CM | POA: Diagnosis not present

## 2021-02-21 DIAGNOSIS — M9903 Segmental and somatic dysfunction of lumbar region: Secondary | ICD-10-CM | POA: Diagnosis not present

## 2021-02-21 DIAGNOSIS — M9901 Segmental and somatic dysfunction of cervical region: Secondary | ICD-10-CM | POA: Diagnosis not present

## 2021-02-21 DIAGNOSIS — M5031 Other cervical disc degeneration,  high cervical region: Secondary | ICD-10-CM | POA: Diagnosis not present

## 2021-03-02 ENCOUNTER — Other Ambulatory Visit: Payer: Self-pay | Admitting: Allergy & Immunology

## 2021-03-07 DIAGNOSIS — M5031 Other cervical disc degeneration,  high cervical region: Secondary | ICD-10-CM | POA: Diagnosis not present

## 2021-03-07 DIAGNOSIS — H402221 Chronic angle-closure glaucoma, left eye, mild stage: Secondary | ICD-10-CM | POA: Diagnosis not present

## 2021-03-07 DIAGNOSIS — M5431 Sciatica, right side: Secondary | ICD-10-CM | POA: Diagnosis not present

## 2021-03-07 DIAGNOSIS — H25813 Combined forms of age-related cataract, bilateral: Secondary | ICD-10-CM | POA: Diagnosis not present

## 2021-03-07 DIAGNOSIS — H402212 Chronic angle-closure glaucoma, right eye, moderate stage: Secondary | ICD-10-CM | POA: Diagnosis not present

## 2021-03-07 DIAGNOSIS — E119 Type 2 diabetes mellitus without complications: Secondary | ICD-10-CM | POA: Diagnosis not present

## 2021-03-07 DIAGNOSIS — H353131 Nonexudative age-related macular degeneration, bilateral, early dry stage: Secondary | ICD-10-CM | POA: Diagnosis not present

## 2021-03-07 DIAGNOSIS — E118 Type 2 diabetes mellitus with unspecified complications: Secondary | ICD-10-CM | POA: Diagnosis not present

## 2021-03-07 DIAGNOSIS — M9903 Segmental and somatic dysfunction of lumbar region: Secondary | ICD-10-CM | POA: Diagnosis not present

## 2021-03-07 DIAGNOSIS — M9901 Segmental and somatic dysfunction of cervical region: Secondary | ICD-10-CM | POA: Diagnosis not present

## 2021-03-07 LAB — HM DIABETES EYE EXAM

## 2021-03-12 NOTE — Progress Notes (Signed)
? ? ?Subjective:  ? ? Patient ID: [name], male    DOB: 01/22/1950, 71 y.o.   MRN: [id number] ? ?This visit occurred during the SARS-CoV-2 public health emergency.  Safety protocols were in place, including screening questions prior to the visit, additional usage of staff PPE, and extensive cleaning of exam room while observing appropriate contact time as indicated for disinfecting solutions. ? ? ? ?HPI ?Fed is here for  ?Chief Complaint  ?Patient presents with  ? Headache  ?  Headache, sinus drainage and congestion  ? ? ? ?His symptoms started about 2 weeks ago.  He states nasal congestion, ear pain, sinus pressure, sore throat, cough, dizziness and headaches.  He does have a history of sinus infections. ? ?He denies any fevers, shortness of breath or wheezing. ? ? ? ?Medications and allergies reviewed with patient and updated if appropriate. ? ?Current Outpatient Medications on File Prior to Visit  ?Medication Sig Dispense Refill  ? allopurinol (ZYLOPRIM) 100 MG tablet Take 1 tablet by mouth twice daily 180 tablet 0  ? Ascorbic Acid (VITAMIN C WITH ROSE HIPS) 500 MG tablet Take 1,000 mg by mouth daily.    ? atorvastatin (LIPITOR) 10 MG tablet Take 1 tablet (10 mg total) by mouth daily. 90 tablet 3  ? Azelastine HCl 0.15 % SOLN Place 1-2 sprays into both nostrils 2 (two) times daily as needed. 30 mL 5  ? Azelastine HCl 137 MCG/SPRAY SOLN USE 1 TO 2 SPRAY(S) IN EACH NOSTRIL TWICE DAILY FOR RUNNY NOSE 30 mL 0  ? B Complex-C (SUPER B COMPLEX PO) Take 1 tablet by mouth daily.    ? Calcium Carbonate-Vitamin D (CALCIUM-VITAMIN D3 PO) Take 1 tablet by mouth every other day.    ? Cholecalciferol (VITAMIN D3) 1000 units CAPS Take 1,000 Units by mouth daily.    ? Garlic 4097 MG CAPS Take 1,000 mg by mouth every other day.    ? glucosamine-chondroitin 500-400 MG tablet Take 2 tablets by mouth daily.    ? levocetirizine (XYZAL) 5 MG tablet Take 10 mg by mouth 2 (two) times daily.    ? levothyroxine (SYNTHROID) 100  MCG tablet Take 1 tablet (100 mcg total) by mouth daily before breakfast. 90 tablet 3  ? losartan-hydrochlorothiazide (HYZAAR) 100-12.5 MG tablet Take 1 tablet by mouth daily after breakfast. 90 tablet 3  ? Magnesium 100 MG TABS Take 100 mg by mouth every other day.    ? meloxicam (MOBIC) 15 MG tablet Take 1 tablet (15 mg total) by mouth daily as needed for pain. Take with food 90 tablet 1  ? metFORMIN (GLUCOPHAGE-XR) 500 MG 24 hr tablet Take 1 tablet (500 mg) in the morning and 2 tablets (1000 mg) at night 270 tablet 3  ? Multiple Vitamins-Minerals (MULTIVITAMIN ADULTS 50+ PO) Take 1 tablet by mouth daily.    ? pantoprazole (PROTONIX) 40 MG tablet Take 1 tablet (40 mg total) by mouth daily. 90 tablet 3  ? vitamin E 400 UNIT capsule Take 400 Units by mouth every other day.    ? [DISCONTINUED] Azelastine-Fluticasone 137-50 MCG/ACT SUSP Place 1 spray into the nose 2 (two) times daily as needed. (Patient not taking: Reported on 09/16/2019) 23 g 2  ? [DISCONTINUED] fluticasone (FLONASE) 50 MCG/ACT nasal spray Place 2 sprays into both nostrils daily. (Patient not taking: Reported on 09/16/2019) 1 g 5  ? ?No current facility-administered medications on file prior to visit.  ? ? ?Review of Systems  ?Constitutional:  Negative for  fever.  ?HENT:  Positive for congestion, ear pain, sinus pain and sore throat.   ?Respiratory:  Positive for cough. Negative for shortness of breath and wheezing.   ?Gastrointestinal:  Negative for nausea.  ?Musculoskeletal:  Negative for myalgias.  ?Neurological:  Positive for dizziness and headaches.  ? ?   ?Objective:  ? ?Vitals:  ? 03/14/21 1022  ?BP: (!) 148/78  ?Pulse: 77  ?Temp: 98.3 ?F (36.8 ?C)  ?SpO2: 97%  ? ?BP Readings from Last 3 Encounters:  ?03/14/21 (!) 148/78  ?12/27/20 140/88  ?06/21/20 138/72  ? ?Wt Readings from Last 3 Encounters:  ?03/14/21 201 lb 9.6 oz (91.4 kg)  ?12/27/20 196 lb (88.9 kg)  ?06/21/20 194 lb (88 kg)  ? ?Body mass index is 29.77 kg/m?. ? ?  ?Physical  Exam ?Constitutional:   ?   General: He is not in acute distress. ?   Appearance: Normal appearance. He is not ill-appearing.  ?   Comments: Sounds hoarse  ?HENT:  ?   Head: Normocephalic.  ?   Right Ear: Tympanic membrane, ear canal and external ear normal. There is no impacted cerumen.  ?   Left Ear: Tympanic membrane, ear canal and external ear normal. There is no impacted cerumen.  ?   Mouth/Throat:  ?   Mouth: Mucous membranes are moist.  ?   Pharynx: No oropharyngeal exudate or posterior oropharyngeal erythema.  ?   Comments: Mild erythema ?Eyes:  ?   Conjunctiva/sclera: Conjunctivae normal.  ?Cardiovascular:  ?   Rate and Rhythm: Normal rate and regular rhythm.  ?Pulmonary:  ?   Effort: Pulmonary effort is normal. No respiratory distress.  ?   Breath sounds: Normal breath sounds. No wheezing or rales.  ?Musculoskeletal:  ?   Cervical back: Neck supple. No tenderness.  ?Lymphadenopathy:  ?   Cervical: No cervical adenopathy.  ?Skin: ?   General: Skin is warm and dry.  ?   Findings: No rash.  ?Neurological:  ?   Mental Status: He is alert.  ? ?   ? ? ? ? ? ?Assessment & Plan:  ? ? ?See Problem List for Assessment and Plan of chronic medical problems.  ? ? ? ? ?

## 2021-03-14 ENCOUNTER — Other Ambulatory Visit: Payer: Self-pay

## 2021-03-14 ENCOUNTER — Ambulatory Visit (INDEPENDENT_AMBULATORY_CARE_PROVIDER_SITE_OTHER): Payer: Medicare Other | Admitting: Internal Medicine

## 2021-03-14 ENCOUNTER — Encounter: Payer: Self-pay | Admitting: Internal Medicine

## 2021-03-14 VITALS — BP 148/78 | HR 77 | Temp 98.3°F | Ht 69.0 in | Wt 201.6 lb

## 2021-03-14 DIAGNOSIS — J019 Acute sinusitis, unspecified: Secondary | ICD-10-CM | POA: Diagnosis not present

## 2021-03-14 DIAGNOSIS — I10 Essential (primary) hypertension: Secondary | ICD-10-CM | POA: Diagnosis not present

## 2021-03-14 DIAGNOSIS — Z122 Encounter for screening for malignant neoplasm of respiratory organs: Secondary | ICD-10-CM | POA: Diagnosis not present

## 2021-03-14 MED ORDER — AMOXICILLIN-POT CLAVULANATE 875-125 MG PO TABS: 1.0000 | ORAL_TABLET | Freq: Two times a day (BID) | ORAL | 0 refills | Status: AC

## 2021-03-14 NOTE — Patient Instructions (Addendum)
? ? ?  You have a sinus infection.  ? ? ?Take the antibiotic as prescribed.  You can take otc cold medications for your symptoms.   Drink lots of fluids and get more rest.   ? ?

## 2021-03-14 NOTE — Assessment & Plan Note (Signed)
Chronic ?Blood pressure here today slightly high, but he is sick and that likely accounts for it being elevated ?We will hold off on making any changes at this time ?Monitor at home ?Continue losartan-HCTZ 100-12.5 mg daily ?

## 2021-03-14 NOTE — Assessment & Plan Note (Signed)
Acute Likely bacterial  Start Augmentin 875-125 mg BID x 10 day otc cold medications Rest, fluid Call if no improvement  

## 2021-03-18 ENCOUNTER — Encounter: Payer: Self-pay | Admitting: Internal Medicine

## 2021-03-18 NOTE — Progress Notes (Signed)
Outside notes received. Information abstracted. Notes sent to scan.  

## 2021-03-21 DIAGNOSIS — M9901 Segmental and somatic dysfunction of cervical region: Secondary | ICD-10-CM | POA: Diagnosis not present

## 2021-03-21 DIAGNOSIS — M9903 Segmental and somatic dysfunction of lumbar region: Secondary | ICD-10-CM | POA: Diagnosis not present

## 2021-03-21 DIAGNOSIS — M5031 Other cervical disc degeneration,  high cervical region: Secondary | ICD-10-CM | POA: Diagnosis not present

## 2021-03-21 DIAGNOSIS — M5431 Sciatica, right side: Secondary | ICD-10-CM | POA: Diagnosis not present

## 2021-04-04 DIAGNOSIS — M9901 Segmental and somatic dysfunction of cervical region: Secondary | ICD-10-CM | POA: Diagnosis not present

## 2021-04-04 DIAGNOSIS — M9903 Segmental and somatic dysfunction of lumbar region: Secondary | ICD-10-CM | POA: Diagnosis not present

## 2021-04-04 DIAGNOSIS — M5031 Other cervical disc degeneration,  high cervical region: Secondary | ICD-10-CM | POA: Diagnosis not present

## 2021-04-04 DIAGNOSIS — M5431 Sciatica, right side: Secondary | ICD-10-CM | POA: Diagnosis not present

## 2021-04-14 DIAGNOSIS — M9901 Segmental and somatic dysfunction of cervical region: Secondary | ICD-10-CM | POA: Diagnosis not present

## 2021-04-14 DIAGNOSIS — M9903 Segmental and somatic dysfunction of lumbar region: Secondary | ICD-10-CM | POA: Diagnosis not present

## 2021-04-14 DIAGNOSIS — M5031 Other cervical disc degeneration,  high cervical region: Secondary | ICD-10-CM | POA: Diagnosis not present

## 2021-04-14 DIAGNOSIS — M5431 Sciatica, right side: Secondary | ICD-10-CM | POA: Diagnosis not present

## 2021-04-26 DIAGNOSIS — M5031 Other cervical disc degeneration,  high cervical region: Secondary | ICD-10-CM | POA: Diagnosis not present

## 2021-04-26 DIAGNOSIS — M9901 Segmental and somatic dysfunction of cervical region: Secondary | ICD-10-CM | POA: Diagnosis not present

## 2021-04-26 DIAGNOSIS — M5431 Sciatica, right side: Secondary | ICD-10-CM | POA: Diagnosis not present

## 2021-04-26 DIAGNOSIS — M9903 Segmental and somatic dysfunction of lumbar region: Secondary | ICD-10-CM | POA: Diagnosis not present

## 2021-04-29 ENCOUNTER — Telehealth: Payer: Self-pay

## 2021-04-29 ENCOUNTER — Other Ambulatory Visit: Payer: Self-pay

## 2021-04-29 NOTE — Telephone Encounter (Signed)
Left message for patient to call to schedule annual LDCT 

## 2021-05-02 ENCOUNTER — Other Ambulatory Visit: Payer: Self-pay

## 2021-05-02 DIAGNOSIS — Z122 Encounter for screening for malignant neoplasm of respiratory organs: Secondary | ICD-10-CM

## 2021-05-02 DIAGNOSIS — H402212 Chronic angle-closure glaucoma, right eye, moderate stage: Secondary | ICD-10-CM | POA: Diagnosis not present

## 2021-05-02 DIAGNOSIS — Z87891 Personal history of nicotine dependence: Secondary | ICD-10-CM

## 2021-05-02 DIAGNOSIS — H402221 Chronic angle-closure glaucoma, left eye, mild stage: Secondary | ICD-10-CM | POA: Diagnosis not present

## 2021-05-09 DIAGNOSIS — M9903 Segmental and somatic dysfunction of lumbar region: Secondary | ICD-10-CM | POA: Diagnosis not present

## 2021-05-09 DIAGNOSIS — M5031 Other cervical disc degeneration,  high cervical region: Secondary | ICD-10-CM | POA: Diagnosis not present

## 2021-05-09 DIAGNOSIS — M5431 Sciatica, right side: Secondary | ICD-10-CM | POA: Diagnosis not present

## 2021-05-09 DIAGNOSIS — M9901 Segmental and somatic dysfunction of cervical region: Secondary | ICD-10-CM | POA: Diagnosis not present

## 2021-05-23 ENCOUNTER — Ambulatory Visit (INDEPENDENT_AMBULATORY_CARE_PROVIDER_SITE_OTHER): Payer: Medicare Other

## 2021-05-23 DIAGNOSIS — Z122 Encounter for screening for malignant neoplasm of respiratory organs: Secondary | ICD-10-CM

## 2021-05-23 DIAGNOSIS — M9901 Segmental and somatic dysfunction of cervical region: Secondary | ICD-10-CM | POA: Diagnosis not present

## 2021-05-23 DIAGNOSIS — M5031 Other cervical disc degeneration,  high cervical region: Secondary | ICD-10-CM | POA: Diagnosis not present

## 2021-05-23 DIAGNOSIS — M5431 Sciatica, right side: Secondary | ICD-10-CM | POA: Diagnosis not present

## 2021-05-23 DIAGNOSIS — Z87891 Personal history of nicotine dependence: Secondary | ICD-10-CM

## 2021-05-23 DIAGNOSIS — M9903 Segmental and somatic dysfunction of lumbar region: Secondary | ICD-10-CM | POA: Diagnosis not present

## 2021-05-25 ENCOUNTER — Other Ambulatory Visit: Payer: Self-pay | Admitting: Acute Care

## 2021-05-25 DIAGNOSIS — Z87891 Personal history of nicotine dependence: Secondary | ICD-10-CM

## 2021-05-25 DIAGNOSIS — Z122 Encounter for screening for malignant neoplasm of respiratory organs: Secondary | ICD-10-CM

## 2021-06-07 DIAGNOSIS — M5431 Sciatica, right side: Secondary | ICD-10-CM | POA: Diagnosis not present

## 2021-06-07 DIAGNOSIS — M5031 Other cervical disc degeneration,  high cervical region: Secondary | ICD-10-CM | POA: Diagnosis not present

## 2021-06-07 DIAGNOSIS — M9903 Segmental and somatic dysfunction of lumbar region: Secondary | ICD-10-CM | POA: Diagnosis not present

## 2021-06-07 DIAGNOSIS — M9901 Segmental and somatic dysfunction of cervical region: Secondary | ICD-10-CM | POA: Diagnosis not present

## 2021-06-20 DIAGNOSIS — M5431 Sciatica, right side: Secondary | ICD-10-CM | POA: Diagnosis not present

## 2021-06-20 DIAGNOSIS — M5031 Other cervical disc degeneration,  high cervical region: Secondary | ICD-10-CM | POA: Diagnosis not present

## 2021-06-20 DIAGNOSIS — M9901 Segmental and somatic dysfunction of cervical region: Secondary | ICD-10-CM | POA: Diagnosis not present

## 2021-06-20 DIAGNOSIS — M9903 Segmental and somatic dysfunction of lumbar region: Secondary | ICD-10-CM | POA: Diagnosis not present

## 2021-06-24 ENCOUNTER — Ambulatory Visit: Payer: Medicare Other

## 2021-06-26 ENCOUNTER — Encounter: Payer: Self-pay | Admitting: Internal Medicine

## 2021-06-26 NOTE — Patient Instructions (Addendum)
     Blood work was ordered.     Medications changes include :   none   Your prescription(s) have been sent to your pharmacy.    A referral was ordered for XX.     Someone from that office will call you to schedule an appointment.    Return in about 6 months (around 12/27/2021) for follow up.

## 2021-06-26 NOTE — Progress Notes (Unsigned)
Subjective:    Patient ID: [name], male    DOB: 10/23/1950, 71 y.o.   MRN: [id number]     HPI [name] is here for follow up of his chronic medical problems, including DM, htn, hld, hypothyroid, GERD, gout, hand OA  He is taking all of his medications as prescribed.      Medications and allergies reviewed with patient and updated if appropriate.  Current Outpatient Medications on File Prior to Visit  Medication Sig Dispense Refill   allopurinol (ZYLOPRIM) 100 MG tablet Take 1 tablet by mouth twice daily 180 tablet 0   Ascorbic Acid (VITAMIN C WITH ROSE HIPS) 500 MG tablet Take 1,000 mg by mouth daily.     atorvastatin (LIPITOR) 10 MG tablet Take 1 tablet (10 mg total) by mouth daily. 90 tablet 3   Azelastine HCl 0.15 % SOLN Place 1-2 sprays into both nostrils 2 (two) times daily as needed. 30 mL 5   Azelastine HCl 137 MCG/SPRAY SOLN USE 1 TO 2 SPRAY(S) IN EACH NOSTRIL TWICE DAILY FOR RUNNY NOSE 30 mL 0   B Complex-C (SUPER B COMPLEX PO) Take 1 tablet by mouth daily.     Calcium Carbonate-Vitamin D (CALCIUM-VITAMIN D3 PO) Take 1 tablet by mouth every other day.     Cholecalciferol (VITAMIN D3) 1000 units CAPS Take 1,000 Units by mouth daily.     Garlic 7371 MG CAPS Take 1,000 mg by mouth every other day.     glucosamine-chondroitin 500-400 MG tablet Take 2 tablets by mouth daily.     levocetirizine (XYZAL) 5 MG tablet Take 10 mg by mouth 2 (two) times daily.     levothyroxine (SYNTHROID) 100 MCG tablet Take 1 tablet (100 mcg total) by mouth daily before breakfast. 90 tablet 3   losartan-hydrochlorothiazide (HYZAAR) 100-12.5 MG tablet Take 1 tablet by mouth daily after breakfast. 90 tablet 3   Magnesium 100 MG TABS Take 100 mg by mouth every other day.     meloxicam (MOBIC) 15 MG tablet Take 1 tablet (15 mg total) by mouth daily as needed for pain. Take with food 90 tablet 1   metFORMIN (GLUCOPHAGE-XR) 500 MG 24 hr tablet Take 1 tablet (500 mg) in the morning and 2  tablets (1000 mg) at night 270 tablet 3   Multiple Vitamins-Minerals (MULTIVITAMIN ADULTS 50+ PO) Take 1 tablet by mouth daily.     pantoprazole (PROTONIX) 40 MG tablet Take 1 tablet (40 mg total) by mouth daily. 90 tablet 3   vitamin E 400 UNIT capsule Take 400 Units by mouth every other day.     [DISCONTINUED] Azelastine-Fluticasone 137-50 MCG/ACT SUSP Place 1 spray into the nose 2 (two) times daily as needed. (Patient not taking: Reported on 09/16/2019) 23 g 2   [DISCONTINUED] fluticasone (FLONASE) 50 MCG/ACT nasal spray Place 2 sprays into both nostrils daily. (Patient not taking: Reported on 09/16/2019) 1 g 5   No current facility-administered medications on file prior to visit.     Review of Systems  Constitutional:  Negative for fever.  Respiratory:  Negative for cough, shortness of breath and wheezing.   Cardiovascular:  Negative for chest pain, palpitations and leg swelling.  Neurological:  Positive for light-headedness (with bending over quick). Negative for headaches.       Objective:   Vitals:   06/27/21 0755  BP: 136/84  Pulse: 60  Temp: 98.1 F (36.7 C)  SpO2: 95%   BP Readings from Last 3 Encounters:  06/27/21 136/84  03/14/21 (!) 148/78  12/27/20 140/88   Wt Readings from Last 3 Encounters:  06/27/21 193 lb (87.5 kg)  03/14/21 201 lb 9.6 oz (91.4 kg)  12/27/20 196 lb (88.9 kg)   Body mass index is 28.5 kg/m.    Physical Exam Constitutional:      General: He is not in acute distress.    Appearance: Normal appearance. He is not ill-appearing.  HENT:     Head: Normocephalic and atraumatic.  Eyes:     Conjunctiva/sclera: Conjunctivae normal.  Cardiovascular:     Rate and Rhythm: Normal rate and regular rhythm.     Heart sounds: Normal heart sounds. No murmur heard. Pulmonary:     Effort: Pulmonary effort is normal. No respiratory distress.     Breath sounds: Normal breath sounds. No wheezing or rales.  Musculoskeletal:     Right lower leg: No edema.      Left lower leg: No edema.  Skin:    General: Skin is warm and dry.     Findings: No rash.  Neurological:     Mental Status: He is alert. Mental status is at baseline.  Psychiatric:        Mood and Affect: Mood normal.        Lab Results  Component Value Date   WBC 5.8 12/22/2019   HGB 13.4 12/22/2019   HCT 40.3 12/22/2019   PLT 278.0 12/22/2019   GLUCOSE 113 (H) 12/27/2020   CHOL 132 12/27/2020   TRIG 112.0 12/27/2020   HDL 46.40 12/27/2020   LDLCALC 63 12/27/2020   ALT 17 12/27/2020   AST 22 12/27/2020   NA 140 12/27/2020   K 5.0 12/27/2020   CL 103 12/27/2020   CREATININE 1.23 12/27/2020   BUN 24 (H) 12/27/2020   CO2 28 12/27/2020   TSH 3.29 12/27/2020   PSA 0.29 12/27/2020   INR 1.0 01/30/2017   HGBA1C 6.8 (H) 12/27/2020     Assessment & Plan:    See Problem List for Assessment and Plan of chronic medical problems.

## 2021-06-27 ENCOUNTER — Ambulatory Visit (INDEPENDENT_AMBULATORY_CARE_PROVIDER_SITE_OTHER): Payer: Medicare Other | Admitting: Internal Medicine

## 2021-06-27 VITALS — BP 136/84 | HR 60 | Temp 98.1°F | Ht 69.0 in | Wt 193.0 lb

## 2021-06-27 DIAGNOSIS — E039 Hypothyroidism, unspecified: Secondary | ICD-10-CM | POA: Diagnosis not present

## 2021-06-27 DIAGNOSIS — I700 Atherosclerosis of aorta: Secondary | ICD-10-CM | POA: Diagnosis not present

## 2021-06-27 DIAGNOSIS — E782 Mixed hyperlipidemia: Secondary | ICD-10-CM | POA: Diagnosis not present

## 2021-06-27 DIAGNOSIS — M109 Gout, unspecified: Secondary | ICD-10-CM | POA: Diagnosis not present

## 2021-06-27 DIAGNOSIS — G4733 Obstructive sleep apnea (adult) (pediatric): Secondary | ICD-10-CM

## 2021-06-27 DIAGNOSIS — E118 Type 2 diabetes mellitus with unspecified complications: Secondary | ICD-10-CM

## 2021-06-27 DIAGNOSIS — I2510 Atherosclerotic heart disease of native coronary artery without angina pectoris: Secondary | ICD-10-CM

## 2021-06-27 DIAGNOSIS — K219 Gastro-esophageal reflux disease without esophagitis: Secondary | ICD-10-CM | POA: Diagnosis not present

## 2021-06-27 DIAGNOSIS — I10 Essential (primary) hypertension: Secondary | ICD-10-CM

## 2021-06-27 DIAGNOSIS — M159 Polyosteoarthritis, unspecified: Secondary | ICD-10-CM | POA: Diagnosis not present

## 2021-06-27 LAB — CBC WITH DIFFERENTIAL/PLATELET
Basophils Absolute: 0.1 10*3/uL (ref 0.0–0.1)
Basophils Relative: 1.9 % (ref 0.0–3.0)
Eosinophils Absolute: 1.6 10*3/uL — ABNORMAL HIGH (ref 0.0–0.7)
Eosinophils Relative: 22.2 % — ABNORMAL HIGH (ref 0.0–5.0)
HCT: 38.8 % — ABNORMAL LOW (ref 39.0–52.0)
Hemoglobin: 12.7 g/dL — ABNORMAL LOW (ref 13.0–17.0)
Lymphocytes Relative: 22.8 % (ref 12.0–46.0)
Lymphs Abs: 1.7 10*3/uL (ref 0.7–4.0)
MCHC: 32.7 g/dL (ref 30.0–36.0)
MCV: 89.5 fl (ref 78.0–100.0)
Monocytes Absolute: 0.7 10*3/uL (ref 0.1–1.0)
Monocytes Relative: 9.6 % (ref 3.0–12.0)
Neutro Abs: 3.2 10*3/uL (ref 1.4–7.7)
Neutrophils Relative %: 43.5 % (ref 43.0–77.0)
Platelets: 285 10*3/uL (ref 150.0–400.0)
RBC: 4.34 Mil/uL (ref 4.22–5.81)
RDW: 15 % (ref 11.5–15.5)
WBC: 7.3 10*3/uL (ref 4.0–10.5)

## 2021-06-27 LAB — LIPID PANEL
Cholesterol: 147 mg/dL (ref 0–200)
HDL: 37.2 mg/dL — ABNORMAL LOW (ref >39.00)
LDL Cholesterol: 84 mg/dL (ref 0–99)
NonHDL: 109.97
Total CHOL/HDL Ratio: 4
Triglycerides: 129 mg/dL (ref 0.0–149.0)
VLDL: 25.8 mg/dL (ref 0.0–40.0)

## 2021-06-27 LAB — COMPREHENSIVE METABOLIC PANEL
ALT: 14 U/L (ref 0–53)
AST: 19 U/L (ref 0–37)
Albumin: 3.9 g/dL (ref 3.5–5.2)
Alkaline Phosphatase: 58 U/L (ref 39–117)
BUN: 32 mg/dL — ABNORMAL HIGH (ref 6–23)
CO2: 26 mEq/L (ref 19–32)
Calcium: 9.6 mg/dL (ref 8.4–10.5)
Chloride: 105 mEq/L (ref 96–112)
Creatinine, Ser: 1.32 mg/dL (ref 0.40–1.50)
GFR: 55.38 mL/min — ABNORMAL LOW (ref >60.00)
Glucose, Bld: 112 mg/dL — ABNORMAL HIGH (ref 70–99)
Potassium: 4.3 mEq/L (ref 3.5–5.1)
Sodium: 139 mEq/L (ref 135–145)
Total Bilirubin: 0.3 mg/dL (ref 0.2–1.2)
Total Protein: 6.6 g/dL (ref 6.0–8.3)

## 2021-06-27 LAB — MICROALBUMIN / CREATININE URINE RATIO
Creatinine,U: 91 mg/dL
Microalb Creat Ratio: 14.3 mg/g (ref 0.0–30.0)
Microalb, Ur: 13.1 mg/dL — ABNORMAL HIGH (ref 0.0–1.9)

## 2021-06-27 LAB — TSH: TSH: 4.5 u[IU]/mL (ref 0.35–5.50)

## 2021-06-27 LAB — HEMOGLOBIN A1C: Hgb A1c MFr Bld: 7 % — ABNORMAL HIGH (ref 4.6–6.5)

## 2021-06-27 MED ORDER — ALLOPURINOL 100 MG PO TABS
ORAL_TABLET | ORAL | 1 refills | Status: DC
Start: 2021-06-27 — End: 2021-12-27

## 2021-06-27 MED ORDER — MELOXICAM 15 MG PO TABS: 15.0000 mg | ORAL_TABLET | Freq: Every day | ORAL | 1 refills | Status: DC | PRN

## 2021-06-27 MED ORDER — ATORVASTATIN CALCIUM 10 MG PO TABS: 10.0000 mg | ORAL_TABLET | Freq: Every day | ORAL | 3 refills | Status: DC

## 2021-06-27 MED ORDER — LOSARTAN POTASSIUM-HCTZ 100-12.5 MG PO TABS
1.0000 | ORAL_TABLET | Freq: Every day | ORAL | 3 refills | Status: DC
Start: 2021-06-27 — End: 2022-08-14

## 2021-06-27 MED ORDER — METFORMIN HCL ER 500 MG PO TB24: ORAL_TABLET | ORAL | 3 refills | Status: DC

## 2021-06-27 NOTE — Assessment & Plan Note (Signed)
Chronic Continue atorvastatin 10 mg daily  LDL at goal Healthy diet and increased activity encouraged

## 2021-06-27 NOTE — Assessment & Plan Note (Signed)
Chronic GERD controlled Continue pantoprazole 40 mg daily 

## 2021-06-27 NOTE — Assessment & Plan Note (Signed)
Chronic Not currently using machine -- needs reevaluation and possibly new machine Referral ordered for pulmonary

## 2021-06-27 NOTE — Assessment & Plan Note (Signed)
Chronic °Blood pressure well controlled °CMP °Continue losartan-HCTZ 100-12.5 mg daily °

## 2021-06-27 NOTE — Assessment & Plan Note (Signed)
Chronic Controlled, Stable Continue allopurinol 100 mg twice daily 

## 2021-06-27 NOTE — Assessment & Plan Note (Signed)
Chronic  Lab Results  Component Value Date   HGBA1C 6.8 (H) 12/27/2020   Sugars well controlled Testing sugars 1 times a day Check A1c, urine microalbumin today Continue metformin XR 500 mg in the morning, 1000 mg at dinner Stressed regular exercise, diabetic diet

## 2021-06-27 NOTE — Assessment & Plan Note (Signed)
Chronic Regular exercise and healthy diet encouraged Check lipid panel  Continue atorvastatin 10 mg daily 

## 2021-06-27 NOTE — Assessment & Plan Note (Signed)
Chronic  Clinically euthyroid Currently taking levothyroxine 100 mcg daily Check tsh  Titrate med dose if needed  

## 2021-06-27 NOTE — Assessment & Plan Note (Signed)
Chronic Continue meloxicam 15 mg daily as needed only

## 2021-07-04 ENCOUNTER — Ambulatory Visit (INDEPENDENT_AMBULATORY_CARE_PROVIDER_SITE_OTHER): Payer: Medicare Other

## 2021-07-04 DIAGNOSIS — M9901 Segmental and somatic dysfunction of cervical region: Secondary | ICD-10-CM | POA: Diagnosis not present

## 2021-07-04 DIAGNOSIS — Z1211 Encounter for screening for malignant neoplasm of colon: Secondary | ICD-10-CM | POA: Diagnosis not present

## 2021-07-04 DIAGNOSIS — M5431 Sciatica, right side: Secondary | ICD-10-CM | POA: Diagnosis not present

## 2021-07-04 DIAGNOSIS — Z0000 Encounter for general adult medical examination without abnormal findings: Secondary | ICD-10-CM

## 2021-07-04 DIAGNOSIS — M5031 Other cervical disc degeneration,  high cervical region: Secondary | ICD-10-CM | POA: Diagnosis not present

## 2021-07-04 DIAGNOSIS — M9903 Segmental and somatic dysfunction of lumbar region: Secondary | ICD-10-CM | POA: Diagnosis not present

## 2021-07-04 NOTE — Progress Notes (Signed)
Subjective:   [name] is a 71 y.o. male who presents for an Initial Medicare Annual Wellness Visit.   I connected with Khyrin Mabery today by telephone and verified that I am speaking with the correct person using two identifiers. Location patient: home Location provider: work Persons participating in the virtual visit: patient, provider.   I discussed the limitations, risks, security and privacy concerns of performing an evaluation and management service by telephone and the availability of in person appointments. I also discussed with the patient that there may be a patient responsible charge related to this service. The patient expressed understanding and verbally consented to this telephonic visit.    Interactive audio and video telecommunications were attempted between this provider and patient, however failed, due to patient having technical difficulties OR patient did not have access to video capability.  We continued and completed visit with audio only.    Review of Systems     Cardiac Risk Factors include: advanced age (>38men, >38 women);male gender;dyslipidemia     Objective:    Today's Vitals   There is no height or weight on file to calculate BMI.     07/04/2021    1:19 PM 06/24/2019    8:32 AM 04/05/2016    5:46 PM 04/05/2016   11:15 AM 03/24/2016    3:00 PM  Advanced Directives  Does Patient Have a Medical Advance Directive? No Yes Yes Yes No  Type of Best boy of [place];Living will Healthcare Power of [place];Living will   Does patient want to make changes to medical advance directive?  No - Patient declined No - Patient declined    Copy of Healthcare Power of Attorney in Chart?   No - copy requested Yes   Would patient like information on creating a medical advance directive? No - Patient declined  No - Patient declined Yes (MAU/Ambulatory/Procedural Areas - Information given) Yes (MAU/Ambulatory/Procedural Areas -  Information given)    Current Medications (verified) Outpatient Encounter Medications as of 07/04/2021  Medication Sig   allopurinol (ZYLOPRIM) 100 MG tablet Take 1 tablet by mouth twice daily   Ascorbic Acid (VITAMIN C WITH ROSE HIPS) 500 MG tablet Take 1,000 mg by mouth daily.   atorvastatin (LIPITOR) 10 MG tablet Take 1 tablet (10 mg total) by mouth daily.   Azelastine HCl 137 MCG/SPRAY SOLN USE 1 TO 2 SPRAY(S) IN EACH NOSTRIL TWICE DAILY FOR RUNNY NOSE   B Complex-C (SUPER B COMPLEX PO) Take 1 tablet by mouth daily.   Calcium Carbonate-Vitamin D (CALCIUM-VITAMIN D3 PO) Take 1 tablet by mouth every other day.   Cholecalciferol (VITAMIN D3) 1000 units CAPS Take 1,000 Units by mouth daily.   Garlic 1000 MG CAPS Take 1,000 mg by mouth every other day.   glucosamine-chondroitin 500-400 MG tablet Take 2 tablets by mouth daily.   levocetirizine (XYZAL) 5 MG tablet Take 10 mg by mouth 2 (two) times daily.   levothyroxine (SYNTHROID) 100 MCG tablet Take 1 tablet (100 mcg total) by mouth daily before breakfast.   losartan-hydrochlorothiazide (HYZAAR) 100-12.5 MG tablet Take 1 tablet by mouth daily after breakfast.   Magnesium 100 MG TABS Take 100 mg by mouth every other day.   meloxicam (MOBIC) 15 MG tablet Take 1 tablet (15 mg total) by mouth daily as needed for pain. Take with food   metFORMIN (GLUCOPHAGE-XR) 500 MG 24 hr tablet Take 1 tablet (500 mg) in the morning and 2 tablets (1000 mg) at night  Multiple Vitamins-Minerals (MULTIVITAMIN ADULTS 50+ PO) Take 1 tablet by mouth daily.   pantoprazole (PROTONIX) 40 MG tablet Take 1 tablet (40 mg total) by mouth daily.   vitamin E 400 UNIT capsule Take 400 Units by mouth every other day.   Azelastine HCl 0.15 % SOLN Place 1-2 sprays into both nostrils 2 (two) times daily as needed. (Patient not taking: Reported on 07/04/2021)   [DISCONTINUED] Azelastine-Fluticasone 137-50 MCG/ACT SUSP Place 1 spray into the nose 2 (two) times daily as needed.  (Patient not taking: Reported on 09/16/2019)   [DISCONTINUED] fluticasone (FLONASE) 50 MCG/ACT nasal spray Place 2 sprays into both nostrils daily. (Patient not taking: Reported on 09/16/2019)   No facility-administered encounter medications on file as of 07/04/2021.    Allergies (verified) Morphine   History: Past Medical History:  Diagnosis Date   Arthritis    Diabetes mellitus without complication (HCC)    Diverticulitis of colon    GERD (gastroesophageal reflux disease)    Headache    Hypertension    Hypothyroidism    Thyroid disease    Past Surgical History:  Procedure Laterality Date   ANKLE FRACTURE SURGERY     ANTERIOR CERVICAL DECOMP/DISCECTOMY FUSION N/A 04/05/2016   Procedure: C5-6, C6-7 Anterior Cervical Discectomy and Fusion, Allograft, Plate;  Surgeon: Eldred Manges, MD;  Location: [facility] OR;  Service: Orthopedics;  Laterality: N/A;   ANTERIOR FUSION CERVICAL SPINE  04/05/2016   c6 c7   CARPAL TUNNEL RELEASE Bilateral    JOINT REPLACEMENT  01/07   left knee replaced    JOINT REPLACEMENT  11/10   right knee replaced    KNEE SURGERY     LEFT HEART CATH AND CORONARY ANGIOGRAPHY N/A 02/06/2017   Procedure: LEFT HEART CATH AND CORONARY ANGIOGRAPHY;  Surgeon: Lyn Records, MD;  Location: [facility] INVASIVE CV LAB;  Service: Cardiovascular;  Laterality: N/A;   LEG SURGERY  11/03   broken lower right leg crushed    Family History  Problem Relation Age of Onset   Hypertension Mother    Diabetes Mother    Heart attack Father    Heart disease Father    Diabetes Paternal Grandmother    Cancer Paternal Grandfather        of eye   Social History   Socioeconomic History   Marital status: Divorced    Spouse name: Not on file   Number of children: Not on file   Years of education: Not on file   Highest education level: Not on file  Occupational History   Not on file  Tobacco Use   Smoking status: Former    Types: Cigarettes    Quit date: 01/10/2008    Years since quitting:  13.4   Smokeless tobacco: Former    Types: Associate Professor Use: Never used  Substance and Sexual Activity   Alcohol use: Yes    Comment: occasionally   Drug use: No   Sexual activity: Yes    Birth control/protection: None    Comment: Married  Other Topics Concern   Not on file  Social History Narrative   Not on file   Social Determinants of Health   Financial Resource Strain: Low Risk  (07/04/2021)   Overall Financial Resource Strain (CARDIA)    Difficulty of Paying Living Expenses: Not hard at all  Food Insecurity: No Food Insecurity (07/04/2021)   Hunger Vital Sign    Worried About Running Out of Food in the Last Year:  Never true    Ran Out of Food in the Last Year: Never true  Transportation Needs: No Transportation Needs (07/04/2021)   PRAPARE - Administrator, Civil Service (Medical): No    Lack of Transportation (Non-Medical): No  Physical Activity: Not on file  Stress: No Stress Concern Present (07/04/2021)   [employer] of Occupational Health - Occupational Stress Questionnaire    Feeling of Stress : Not at all  Social Connections: Moderately Integrated (07/04/2021)   Social Connection and Isolation Panel [NHANES]    Frequency of Communication with Friends and Family: Twice a week    Frequency of Social Gatherings with Friends and Family: Twice a week    Attends Religious Services: More than 4 times per year    Active Member of [employer] or Organizations: Yes    Attends Engineer, structural: More than 4 times per year    Marital Status: Divorced    Tobacco Counseling Counseling given: Not Answered   Clinical Intake:  Pre-visit preparation completed: Yes  Pain : No/denies pain     Nutritional Risks: None Diabetes: No  How often do you need to have someone help you when you read instructions, pamphlets, or other written materials from your doctor or pharmacy?: 1 - Never What is the last grade level you completed in school?:  college  Diabetic?yes Nutrition Risk Assessment:  Has the patient had any N/V/D within the last 2 months?  No  Does the patient have any non-healing wounds?  No  Has the patient had any unintentional weight loss or weight gain?  No   Diabetes:  Is the patient diabetic?  Yes  If diabetic, was a CBG obtained today?  No  Did the patient bring in their glucometer from home?  No  How often do you monitor your CBG's? Never .   Financial Strains and Diabetes Management:  Are you having any financial strains with the device, your supplies or your medication? No .  Does the patient want to be seen by Chronic Care Management for management of their diabetes?  No  Would the patient like to be referred to a Nutritionist or for Diabetic Management?  No    Diabetic Exams:  Diabetic Eye Exam: Completed 06/2020 Diabetic Foot Exam: Overdue, Pt has been advised about the importance in completing this exam. Pt is scheduled for diabetic foot exam on next office visit .   Interpreter Needed?: No  Information entered by :: L.Hammad Finkler,LPN   Activities of Daily Living    07/04/2021    1:21 PM 12/27/2020    8:11 AM  In your present state of health, do you have any difficulty performing the following activities:  Hearing? 0 0  Vision? 0 0  Difficulty concentrating or making decisions? 0 0  Walking or climbing stairs? 0 0  Dressing or bathing? 0 0  Doing errands, shopping? 0 0  Preparing Food and eating ? N   Using the Toilet? N   In the past six months, have you accidently leaked urine? N   Do you have problems with loss of bowel control? N   Managing your Medications? N   Managing your Finances? N   Housekeeping or managing your Housekeeping? N     Patient Care Team: Pincus Sanes, MD as PCP - General (Internal Medicine) Lyn Records, MD as PCP - Cardiology (Cardiology) Delora Fuel, OD (Optometry) [place], Garry Heater, [facility] as Pharmacist (Pharmacist)  Indicate any recent Medical  Services  you may have received from other than [facility] providers in the past year (date may be approximate).     Assessment:   This is a routine wellness examination for Torres.  Hearing/Vision screen Vision Screening - Comments:: Annual eye exams wears glasses   Dietary issues and exercise activities discussed: Current Exercise Habits: Home exercise routine, Type of exercise: walking, Time (Minutes): 30, Frequency (Times/Week): 5, Weekly Exercise (Minutes/Week): 150   Goals Addressed   None    Depression Screen    07/04/2021    1:20 PM 07/04/2021    1:11 PM 06/27/2021    7:55 AM 03/14/2021   10:27 AM 06/24/2019    8:32 AM 12/24/2018    8:56 AM 06/28/2018    7:58 AM  PHQ 2/9 Scores  PHQ - 2 Score 0 0 0 0 0 0 0  PHQ- 9 Score   0        Fall Risk    07/04/2021    1:21 PM 06/27/2021    7:55 AM 03/14/2021   10:27 AM 12/27/2020    8:11 AM 06/24/2019    8:32 AM  Fall Risk   Falls in the past year? 0 0 0 0 0  Number falls in past yr: 0  0 0 0  Injury with Fall? 0  0 0 0  Risk for fall due to :   No Fall Risks No Fall Risks No Fall Risks  Follow up Falls evaluation completed;Education provided  Falls evaluation completed Falls evaluation completed Falls evaluation completed    FALL RISK PREVENTION PERTAINING TO THE HOME:  Any stairs in or around the home? Yes  If so, are there any without handrails? No  Home free of loose throw rugs in walkways, pet beds, electrical cords, etc? Yes  Adequate lighting in your home to reduce risk of falls? Yes   ASSISTIVE DEVICES UTILIZED TO PREVENT FALLS:  Life alert? Yes  Use of a cane, walker or w/c? No  Grab bars in the bathroom? No  Shower chair or bench in shower? No  Elevated toilet seat or a handicapped toilet? No    Cognitive Function:    Normal cognitive status assessed by telephone conversation  by this Nurse Health Advisor. No abnormalities found.      Immunizations Immunization History  Administered Date(s) Administered    Influenza, High Dose Seasonal PF 10/12/2017, 11/06/2019   Influenza,inj,Quad PF,6+ Mos 10/11/2018   Influenza-Unspecified 11/16/2020   PFIZER(Purple Top)SARS-COV-2 Vaccination 05/06/2019, 05/26/2019, 12/25/2019   Pneumococcal Conjugate-13 12/24/2018   Pneumococcal Polysaccharide-23 11/12/2017   Tdap 06/27/2017   Zoster Recombinat (Shingrix) 12/27/2017, 03/28/2018    TDAP status: Up to date  Flu Vaccine status: Up to date  Pneumococcal vaccine status: Up to date  Covid-19 vaccine status: Completed vaccines  Qualifies for Shingles Vaccine? Yes   Zostavax completed Yes   Shingrix Completed?: Yes  Screening Tests Health Maintenance  Topic Date Due   COVID-19 Vaccine (4 - Pfizer series) 02/19/2020   FOOT EXAM  06/21/2021   INFLUENZA VACCINE  08/09/2021   COLONOSCOPY (Pts 45-67yrs Insurance coverage will need to be confirmed)  08/27/2021   HEMOGLOBIN A1C  12/27/2021   OPHTHALMOLOGY EXAM  03/07/2022   TETANUS/TDAP  06/28/2027   Pneumonia Vaccine 75+ Years old  Completed   Hepatitis C Screening  Completed   Zoster Vaccines- Shingrix  Completed   HPV VACCINES  Aged Out    Health Maintenance  Health Maintenance Due  Topic Date Due   COVID-19 Vaccine (4 -  Pfizer series) 02/19/2020   FOOT EXAM  06/21/2021    Colorectal cancer screening: Type of screening: Colonoscopy. Completed 08/28/2011. Repeat every 10 years  Lung Cancer Screening: (Low Dose CT Chest recommended if Age 12-80 years, 30 pack-year currently smoking OR have quit w/in 15years.) does not qualify.   Lung Cancer Screening Referral: n/a  Additional Screening:  Hepatitis C Screening: does not qualify;   Vision Screening: Recommended annual ophthalmology exams for early detection of glaucoma and other disorders of the eye. Is the patient up to date with their annual eye exam?  Yes  Who is the provider or what is the name of the office in which the patient attends annual eye exams? Dr.johnson  If pt is not  established with a provider, would they like to be referred to a provider to establish care? No .   Dental Screening: Recommended annual dental exams for proper oral hygiene  Community Resource Referral / Chronic Care Management: CRR required this visit?  No   CCM required this visit?  No      Plan:     I have personally reviewed and noted the following in the patient's chart:   Medical and social history Use of alcohol, tobacco or illicit drugs  Current medications and supplements including opioid prescriptions. Patient is not currently taking opioid prescriptions. Functional ability and status Nutritional status Physical activity Advanced directives List of other physicians Hospitalizations, surgeries, and ER visits in previous 12 months Vitals Screenings to include cognitive, depression, and falls Referrals and appointments  In addition, I have reviewed and discussed with patient certain preventive protocols, quality metrics, and best practice recommendations. A written personalized care plan for preventive services as well as general preventive health recommendations were provided to patient.     March Rummage, LPN   2/84/1324   Nurse Notes: none

## 2021-07-18 DIAGNOSIS — M9903 Segmental and somatic dysfunction of lumbar region: Secondary | ICD-10-CM | POA: Diagnosis not present

## 2021-07-18 DIAGNOSIS — H4020X2 Unspecified primary angle-closure glaucoma, moderate stage: Secondary | ICD-10-CM | POA: Diagnosis not present

## 2021-07-18 DIAGNOSIS — H43812 Vitreous degeneration, left eye: Secondary | ICD-10-CM | POA: Diagnosis not present

## 2021-07-18 DIAGNOSIS — M9901 Segmental and somatic dysfunction of cervical region: Secondary | ICD-10-CM | POA: Diagnosis not present

## 2021-07-18 DIAGNOSIS — M5431 Sciatica, right side: Secondary | ICD-10-CM | POA: Diagnosis not present

## 2021-07-18 DIAGNOSIS — H5203 Hypermetropia, bilateral: Secondary | ICD-10-CM | POA: Diagnosis not present

## 2021-07-18 DIAGNOSIS — H52223 Regular astigmatism, bilateral: Secondary | ICD-10-CM | POA: Diagnosis not present

## 2021-07-18 DIAGNOSIS — E119 Type 2 diabetes mellitus without complications: Secondary | ICD-10-CM | POA: Diagnosis not present

## 2021-07-18 DIAGNOSIS — M5031 Other cervical disc degeneration,  high cervical region: Secondary | ICD-10-CM | POA: Diagnosis not present

## 2021-07-18 DIAGNOSIS — H2513 Age-related nuclear cataract, bilateral: Secondary | ICD-10-CM | POA: Diagnosis not present

## 2021-07-18 DIAGNOSIS — H524 Presbyopia: Secondary | ICD-10-CM | POA: Diagnosis not present

## 2021-07-31 ENCOUNTER — Other Ambulatory Visit: Payer: Self-pay | Admitting: Internal Medicine

## 2021-08-03 DIAGNOSIS — M5431 Sciatica, right side: Secondary | ICD-10-CM | POA: Diagnosis not present

## 2021-08-03 DIAGNOSIS — M5031 Other cervical disc degeneration,  high cervical region: Secondary | ICD-10-CM | POA: Diagnosis not present

## 2021-08-03 DIAGNOSIS — M9901 Segmental and somatic dysfunction of cervical region: Secondary | ICD-10-CM | POA: Diagnosis not present

## 2021-08-03 DIAGNOSIS — M9903 Segmental and somatic dysfunction of lumbar region: Secondary | ICD-10-CM | POA: Diagnosis not present

## 2021-08-15 DIAGNOSIS — M9903 Segmental and somatic dysfunction of lumbar region: Secondary | ICD-10-CM | POA: Diagnosis not present

## 2021-08-15 DIAGNOSIS — M5031 Other cervical disc degeneration,  high cervical region: Secondary | ICD-10-CM | POA: Diagnosis not present

## 2021-08-15 DIAGNOSIS — M5431 Sciatica, right side: Secondary | ICD-10-CM | POA: Diagnosis not present

## 2021-08-15 DIAGNOSIS — M9901 Segmental and somatic dysfunction of cervical region: Secondary | ICD-10-CM | POA: Diagnosis not present

## 2021-08-25 ENCOUNTER — Encounter: Payer: Self-pay | Admitting: Nurse Practitioner

## 2021-08-25 ENCOUNTER — Ambulatory Visit (INDEPENDENT_AMBULATORY_CARE_PROVIDER_SITE_OTHER): Payer: Medicare Other | Admitting: Nurse Practitioner

## 2021-08-25 VITALS — BP 126/78 | HR 62 | Temp 98.5°F | Ht 69.0 in | Wt 193.0 lb

## 2021-08-25 DIAGNOSIS — J3089 Other allergic rhinitis: Secondary | ICD-10-CM

## 2021-08-25 DIAGNOSIS — G4733 Obstructive sleep apnea (adult) (pediatric): Secondary | ICD-10-CM

## 2021-08-25 NOTE — Assessment & Plan Note (Signed)
He has a history of moderate OSA with AHI 23.5 on HST from 2019. He has been off CPAP for the past few years. He has snoring, excessive daytime sleepiness, morning headaches. History of HTN and DM. Given this,  I am concerned he still has sleep disordered breathing with obstructive sleep apnea. He will need repeat sleep study for further evaluation. He has had trouble with CPAP in the past due to worsening sinus symptoms. We discussed possibility of changing to a dreamwear full face mask and use of saline nasal sprays to combat this. We also discussed Inspire device.    - discussed how weight can impact sleep and risk for sleep disordered breathing - discussed options to assist with weight loss: combination of diet modification, cardiovascular and strength training exercises   - had an extensive discussion regarding the adverse health consequences related to untreated sleep disordered breathing - specifically discussed the risks for hypertension, coronary artery disease, cardiac dysrhythmias, cerebrovascular disease, and diabetes - lifestyle modification discussed   - discussed how sleep disruption can increase risk of accidents, particularly when driving - safe driving practices were discussed  Patient Instructions  With your history and symptoms, I am concerned that you still have untreated sleep apnea. We will get you set up for another home sleep test since you have been off of therapy for a few years now. Someone will contact you to get this scheduled.   Continue astelin nasal spray 1-2 sprays each nostril Twice daily for nasal congestion/drainage Continue saline rinses 1-2 times a day Continue levocetirizine 10 mg At bedtime. Can increase to Twice daily as needed for allergies   We discussed how untreated sleep apnea puts an individual at risk for cardiac arrhthymias, pulm HTN, DM, stroke and increases their risk for daytime accidents. We also briefly reviewed treatment options including  weight loss, side sleeping position, oral appliance, CPAP therapy or referral to ENT for possible surgical options (Inspire device).   Follow up in 6-8 weeks with Dr. Elsworth Soho or Alanson Aly. If symptoms do not improve or worsen, please contact office for sooner follow up or seek emergency care.

## 2021-08-25 NOTE — Assessment & Plan Note (Addendum)
Stable on current regimen. Last seen by Dr. Ernst Bowler in 02/2020. Advised that he rotate OTC anithistamines every few months. See above plan.

## 2021-08-25 NOTE — Progress Notes (Signed)
$'@Patient'e$  ID: [name], male    DOB: 1950-12-21, 71 y.o.   MRN: [id number]  Chief Complaint  Patient presents with   Follow-up    Patient is here to talk about his cpap.     Referring provider: Binnie Rail, MD  HPI: 71 year old male, former smoker followed for OSA. He is a patient of Dr. Bari Mantis and last seen via virtual visit on 01/13/2019 by [facility]. Past medical history significant for HTN, allergic rhinitis, GERD, DM II, hypothyroidism, OA, HLD.   TEST/EVENTS:  07/17/2017 HST: AHI 23.5/hr, SaO2 low 77% 05/23/2021 CT lung cancer screening: atherosclerosis. Mild centrilobular emphysema with diffuse bronchial wall thickening. Lung RADS 2.   01/13/2019: Virtual Visit with Warner Mccreedy NP. Previous visit reported nasal congestion; blood work revealed eosinophilia. He was referred to local allergist. Positive allergens to molds, cat, and dog dander. CPAP compliance subpar. Nasal symptoms improved since he saw his allergist. Continues to struggle with cleaning CPAP routinely. Encouraged to increase usage. Follow up with Dr. Verlin Fester as scheduled. Continue lung cancer screening program.   08/25/2021: Today - follow up Patient presents today for overdue follow up. He was encouraged to follow up with [place] by his PCP because he has not been on CPAP therapy in a few years now. He stopped wearing the device because he was struggling with persistent nasal congestion and recurrent sinus infections. Previously used nasal mask; never tried any others. He has excessive daytime fatigue symptoms. He wakes in the morning feeling poorly rested. He does have a history of snoring. He will occasionally have a headache in the morning, which he has attributed to his sinuses. He denies drowsy driving, witnessed apneas, sleep parasomnias/paralysis, narcolepsy or cataplexy.  He goes to bed around 10 PM.  Has no issues falling asleep.  Gets up 3-4 times to use the restroom.  Officially gets out of bed in the morning around 6  6:45 AM.  He does not take anything to help him fall asleep at night.  He does drink, drinks with caffeine in them during the day but not to help keep him awake.  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  Weight has been stable over the past few years.  Previous sleep study was in 2019 and showed moderate OSA.  He is wanting to do something to treat his sleep apnea but is worried about getting back on CPAP given his issues in the past. His sinus symptoms are stable right now. He is currently taking either xyzal or zytec at bedtime and he occasionally uses nasal rinses. He wears a mask when he is mowing. He has not required any abx or prednisone recently. Denies any issues with his breathing or cough.   Allergies  Allergen Reactions   Morphine Other (See Comments)    Severe headache Severe headache Severe headache Severe headache    Immunization History  Administered Date(s) Administered   Influenza, High Dose Seasonal PF 10/12/2017, 11/06/2019   Influenza,inj,Quad PF,6+ Mos 10/11/2018   Influenza-Unspecified 11/16/2020   PFIZER(Purple Top)SARS-COV-2 Vaccination 05/06/2019, 05/26/2019, 12/25/2019   Pneumococcal Conjugate-13 12/24/2018   Pneumococcal Polysaccharide-23 11/12/2017   Tdap 06/27/2017   Zoster Recombinat (Shingrix) 12/27/2017, 03/28/2018    Past Medical History:  Diagnosis Date   Arthritis    Diabetes mellitus without complication ([place])    Diverticulitis of colon    GERD (gastroesophageal reflux disease)    Headache  Hypertension    Hypothyroidism    Thyroid disease     Tobacco History: Social History   Tobacco Use  Smoking Status Former   Types: Cigarettes   Quit date: 01/10/2008   Years since quitting: 13.6  Smokeless Tobacco Former   Types: Chew   Counseling given: Not Answered   Outpatient Medications Prior to Visit  Medication Sig Dispense Refill   allopurinol (ZYLOPRIM) 100 MG tablet  Take 1 tablet by mouth twice daily 180 tablet 1   Ascorbic Acid (VITAMIN C WITH ROSE HIPS) 500 MG tablet Take 1,000 mg by mouth daily.     atorvastatin (LIPITOR) 10 MG tablet Take 1 tablet (10 mg total) by mouth daily. 90 tablet 3   Azelastine HCl 137 MCG/SPRAY SOLN USE 1 TO 2 SPRAY(S) IN EACH NOSTRIL TWICE DAILY FOR RUNNY NOSE 30 mL 0   B Complex-C (SUPER B COMPLEX PO) Take 1 tablet by mouth daily.     Calcium Carbonate-Vitamin D (CALCIUM-VITAMIN D3 PO) Take 1 tablet by mouth every other day.     Cholecalciferol (VITAMIN D3) 1000 units CAPS Take 1,000 Units by mouth daily.     Garlic 8527 MG CAPS Take 1,000 mg by mouth every other day.     glucosamine-chondroitin 500-400 MG tablet Take 2 tablets by mouth daily.     levocetirizine (XYZAL) 5 MG tablet Take 10 mg by mouth 2 (two) times daily.     levothyroxine (SYNTHROID) 100 MCG tablet Take 1 tablet (100 mcg total) by mouth daily before breakfast. 90 tablet 3   losartan-hydrochlorothiazide (HYZAAR) 100-12.5 MG tablet Take 1 tablet by mouth daily after breakfast. 90 tablet 3   Magnesium 100 MG TABS Take 100 mg by mouth every other day.     meloxicam (MOBIC) 15 MG tablet Take 1 tablet (15 mg total) by mouth daily as needed for pain. Take with food 90 tablet 1   metFORMIN (GLUCOPHAGE-XR) 500 MG 24 hr tablet Take 1 tablet (500 mg) in the morning and 2 tablets (1000 mg) at night 270 tablet 3   Multiple Vitamins-Minerals (MULTIVITAMIN ADULTS 50+ PO) Take 1 tablet by mouth daily.     pantoprazole (PROTONIX) 40 MG tablet Take 1 tablet by mouth once daily 90 tablet 0   vitamin E 400 UNIT capsule Take 400 Units by mouth every other day.     Azelastine HCl 0.15 % SOLN Place 1-2 sprays into both nostrils 2 (two) times daily as needed. (Patient not taking: Reported on 07/04/2021) 30 mL 5   No facility-administered medications prior to visit.     Review of Systems:   Constitutional: No weight loss or gain, night sweats, fevers, chills, or lassitude.  +Excessive daytime fatigue HEENT: No difficulty swallowing, tooth/dental problems, or sore throat. No sneezing, itching, ear ache. +chronic nasal congestion, post nasal drip, occasional AM headaches CV:  No chest pain, orthopnea, PND, swelling in lower extremities, anasarca, dizziness, palpitations, syncope Resp: +snoring. No shortness of breath with exertion or at rest. No excess mucus or change in color of mucus. No productive or non-productive. No hemoptysis. No wheezing.  No chest wall deformity GI:  No heartburn, indigestion, abdominal pain, nausea, vomiting, diarrhea, change in bowel habits, loss of appetite, bloody stools.  GU: No dysuria, change in color of urine, urgency or frequency.  No flank pain, no hematuria  Skin: No rash, lesions, ulcerations MSK:  No joint pain or swelling.  No decreased range of motion.  No back pain. Neuro: No dizziness or lightheadedness.  Psych: No depression or anxiety. Mood stable.     Physical Exam:  BP 126/78 (BP Location: Right Arm, Patient Position: Sitting, Cuff Size: Normal)   Pulse 62   Temp 98.5 F (36.9 C) (Oral)   Ht '5\' 9"'$  (1.753 m)   Wt 193 lb (87.5 kg)   SpO2 98%   BMI 28.50 kg/m   GEN: Pleasant, interactive, well-appearing; in no acute distress. HEENT:  Normocephalic and atraumatic. PERRLA. Sclera white. Nasal turbinates pink, moist and patent bilaterally. No rhinorrhea present. Oropharynx pink and moist, without exudate or edema. No lesions, ulcerations, or postnasal drip.  NECK:  Supple w/ fair ROM. No JVD present. Normal carotid impulses w/o bruits. Thyroid symmetrical with no goiter or nodules palpated. No lymphadenopathy.   CV: RRR, no m/r/g, no peripheral edema. Pulses intact, +2 bilaterally. No cyanosis, pallor or clubbing. PULMONARY:  Unlabored, regular breathing. Clear bilaterally A&P w/o wheezes/rales/rhonchi. No accessory muscle use. No dullness to percussion. GI: BS present and normoactive. Soft, non-tender to palpation.  No organomegaly or masses detected. No CVA tenderness. MSK: No erythema, warmth or tenderness. Cap refil <2 sec all extrem. No deformities or joint swelling noted.  Neuro: A/Ox3. No focal deficits noted.   Skin: Warm, no lesions or rashe Psych: Normal affect and behavior. Judgement and thought content appropriate.     Lab Results:  CBC    Component Value Date/Time   WBC 7.3 06/27/2021 0820   RBC 4.34 06/27/2021 0820   HGB 12.7 (L) 06/27/2021 0820   HGB 12.8 (L) 01/30/2017 1600   HCT 38.8 (L) 06/27/2021 0820   HCT 37.6 01/30/2017 1600   PLT 285.0 06/27/2021 0820   PLT 315 01/30/2017 1600   MCV 89.5 06/27/2021 0820   MCV 86 01/30/2017 1600   MCH 29.6 04/03/2017 1100   MCHC 32.7 06/27/2021 0820   RDW 15.0 06/27/2021 0820   RDW 13.7 01/30/2017 1600   LYMPHSABS 1.7 06/27/2021 0820   MONOABS 0.7 06/27/2021 0820   EOSABS 1.6 (H) 06/27/2021 0820   BASOSABS 0.1 06/27/2021 0820    BMET    Component Value Date/Time   NA 139 06/27/2021 0820   NA 139 01/30/2017 1600   K 4.3 06/27/2021 0820   CL 105 06/27/2021 0820   CO2 26 06/27/2021 0820   GLUCOSE 112 (H) 06/27/2021 0820   BUN 32 (H) 06/27/2021 0820   BUN 25 01/30/2017 1600   CREATININE 1.32 06/27/2021 0820   CALCIUM 9.6 06/27/2021 0820   GFRNONAA 71 01/30/2017 1600   GFRAA 82 01/30/2017 1600    BNP No results found for: "BNP"   Imaging:  No results found.        No data to display          No results found for: "NITRICOXIDE"      Assessment & Plan:   OSA (obstructive sleep apnea) He has a history of moderate OSA with AHI 23.5 on HST from 2019. He has been off CPAP for the past few years. He has snoring, excessive daytime sleepiness, morning headaches. History of HTN and DM. Given this,  I am concerned he still has sleep disordered breathing with obstructive sleep apnea. He will need repeat sleep study for further evaluation. He has had trouble with CPAP in the past due to worsening sinus symptoms. We  discussed possibility of changing to a dreamwear full face mask and use of saline nasal sprays to combat this. We also discussed Inspire device.    - discussed how weight can  impact sleep and risk for sleep disordered breathing - discussed options to assist with weight loss: combination of diet modification, cardiovascular and strength training exercises   - had an extensive discussion regarding the adverse health consequences related to untreated sleep disordered breathing - specifically discussed the risks for hypertension, coronary artery disease, cardiac dysrhythmias, cerebrovascular disease, and diabetes - lifestyle modification discussed   - discussed how sleep disruption can increase risk of accidents, particularly when driving - safe driving practices were discussed  Patient Instructions  With your history and symptoms, I am concerned that you still have untreated sleep apnea. We will get you set up for another home sleep test since you have been off of therapy for a few years now. Someone will contact you to get this scheduled.   Continue astelin nasal spray 1-2 sprays each nostril Twice daily for nasal congestion/drainage Continue saline rinses 1-2 times a day Continue levocetirizine 10 mg At bedtime. Can increase to Twice daily as needed for allergies   We discussed how untreated sleep apnea puts an individual at risk for cardiac arrhthymias, pulm HTN, DM, stroke and increases their risk for daytime accidents. We also briefly reviewed treatment options including weight loss, side sleeping position, oral appliance, CPAP therapy or referral to ENT for possible surgical options (Inspire device).   Follow up in 6-8 weeks with Dr. Elsworth Soho or Alanson Aly. If symptoms do not improve or worsen, please contact office for sooner follow up or seek emergency care.     Perennial allergic rhinitis with possible nonallergic component Stable on current regimen. Last seen by Dr. Ernst Bowler in  02/2020. Advised that he rotate OTC anithistamines every few months. See above plan.   I spent 38 minutes of dedicated to the care of this patient on the date of this encounter to include pre-visit review of records, face-to-face time with the patient discussing conditions above, post visit ordering of testing, clinical documentation with the electronic health record, making appropriate referrals as documented, and communicating necessary findings to members of the patients care team.  Clayton Bibles, NP 08/25/2021  Pt aware and understands NP's role.

## 2021-08-25 NOTE — Patient Instructions (Addendum)
With your history and symptoms, I am concerned that you still have untreated sleep apnea. We will get you set up for another home sleep test since you have been off of therapy for a few years now. Someone will contact you to get this scheduled.   Continue astelin nasal spray 1-2 sprays each nostril Twice daily for nasal congestion/drainage Continue saline rinses 1-2 times a day Continue levocetirizine 10 mg At bedtime. Can increase to Twice daily as needed for allergies   We discussed how untreated sleep apnea puts an individual at risk for cardiac arrhthymias, pulm HTN, DM, stroke and increases their risk for daytime accidents. We also briefly reviewed treatment options including weight loss, side sleeping position, oral appliance, CPAP therapy or referral to ENT for possible surgical options (Inspire device).   Follow up in 6-8 weeks with Dr. Elsworth Soho or Alanson Aly. If symptoms do not improve or worsen, please contact office for sooner follow up or seek emergency care.

## 2021-08-29 DIAGNOSIS — M5031 Other cervical disc degeneration,  high cervical region: Secondary | ICD-10-CM | POA: Diagnosis not present

## 2021-08-29 DIAGNOSIS — M9901 Segmental and somatic dysfunction of cervical region: Secondary | ICD-10-CM | POA: Diagnosis not present

## 2021-08-29 DIAGNOSIS — M9903 Segmental and somatic dysfunction of lumbar region: Secondary | ICD-10-CM | POA: Diagnosis not present

## 2021-08-29 DIAGNOSIS — M5431 Sciatica, right side: Secondary | ICD-10-CM | POA: Diagnosis not present

## 2021-09-13 DIAGNOSIS — M9903 Segmental and somatic dysfunction of lumbar region: Secondary | ICD-10-CM | POA: Diagnosis not present

## 2021-09-13 DIAGNOSIS — M9901 Segmental and somatic dysfunction of cervical region: Secondary | ICD-10-CM | POA: Diagnosis not present

## 2021-09-13 DIAGNOSIS — M5031 Other cervical disc degeneration,  high cervical region: Secondary | ICD-10-CM | POA: Diagnosis not present

## 2021-09-13 DIAGNOSIS — M5431 Sciatica, right side: Secondary | ICD-10-CM | POA: Diagnosis not present

## 2021-09-26 DIAGNOSIS — M5031 Other cervical disc degeneration,  high cervical region: Secondary | ICD-10-CM | POA: Diagnosis not present

## 2021-09-26 DIAGNOSIS — M9901 Segmental and somatic dysfunction of cervical region: Secondary | ICD-10-CM | POA: Diagnosis not present

## 2021-09-26 DIAGNOSIS — M5431 Sciatica, right side: Secondary | ICD-10-CM | POA: Diagnosis not present

## 2021-09-26 DIAGNOSIS — M9903 Segmental and somatic dysfunction of lumbar region: Secondary | ICD-10-CM | POA: Diagnosis not present

## 2021-10-10 DIAGNOSIS — M9901 Segmental and somatic dysfunction of cervical region: Secondary | ICD-10-CM | POA: Diagnosis not present

## 2021-10-10 DIAGNOSIS — M5431 Sciatica, right side: Secondary | ICD-10-CM | POA: Diagnosis not present

## 2021-10-10 DIAGNOSIS — M9903 Segmental and somatic dysfunction of lumbar region: Secondary | ICD-10-CM | POA: Diagnosis not present

## 2021-10-10 DIAGNOSIS — M5031 Other cervical disc degeneration,  high cervical region: Secondary | ICD-10-CM | POA: Diagnosis not present

## 2021-10-11 ENCOUNTER — Ambulatory Visit (AMBULATORY_SURGERY_CENTER): Payer: Self-pay | Admitting: *Deleted

## 2021-10-11 VITALS — Ht 69.0 in | Wt 192.0 lb

## 2021-10-11 DIAGNOSIS — Z1211 Encounter for screening for malignant neoplasm of colon: Secondary | ICD-10-CM

## 2021-10-11 MED ORDER — NA SULFATE-K SULFATE-MG SULF 17.5-3.13-1.6 GM/177ML PO SOLN: 1.0000 | Freq: Once | ORAL | 0 refills | Status: AC

## 2021-10-11 NOTE — Progress Notes (Signed)
No egg or soy allergy known to patient  No issues known to pt with past sedation with any surgeries or procedures Patient denies ever being told they had issues or difficulty with intubation  No FH of Malignant Hyperthermia Pt is not on diet pills Pt is not on  home 02  Pt is not on blood thinners  Pt denies issues with constipation  No A fib or A flutter Have any cardiac testing pending--no Pt instructed to use [email or web address] or GoodRx for a price reduction on prep   

## 2021-10-17 ENCOUNTER — Ambulatory Visit: Payer: Medicare Other

## 2021-10-17 ENCOUNTER — Ambulatory Visit: Payer: Medicare Other | Admitting: Nurse Practitioner

## 2021-10-17 DIAGNOSIS — G4733 Obstructive sleep apnea (adult) (pediatric): Secondary | ICD-10-CM

## 2021-10-21 ENCOUNTER — Other Ambulatory Visit: Payer: Self-pay | Admitting: Gastroenterology

## 2021-10-21 ENCOUNTER — Encounter: Payer: Self-pay | Admitting: Gastroenterology

## 2021-10-21 ENCOUNTER — Ambulatory Visit (AMBULATORY_SURGERY_CENTER): Payer: Medicare Other | Admitting: Gastroenterology

## 2021-10-21 VITALS — BP 134/76 | HR 58 | Temp 98.2°F | Resp 13 | Ht 69.0 in | Wt 192.0 lb

## 2021-10-21 DIAGNOSIS — Z1211 Encounter for screening for malignant neoplasm of colon: Secondary | ICD-10-CM

## 2021-10-21 DIAGNOSIS — D125 Benign neoplasm of sigmoid colon: Secondary | ICD-10-CM

## 2021-10-21 MED ORDER — SODIUM CHLORIDE 0.9 % IV SOLN: 500.0000 mL | Freq: Once | INTRAVENOUS | Status: DC

## 2021-10-21 NOTE — Patient Instructions (Signed)
Handout on hemorrhoids, diverticulosis, high-fiber diet, and polyps given to patient.  Await pathology results. Resume previous diet and continue present medications. Repeat colonoscopy for surveillance will be determined based off of pathology results.   YOU HAD AN ENDOSCOPIC PROCEDURE TODAY AT [facility]:   Refer to the procedure report that was given to you for any specific questions about what was found during the examination.  If the procedure report does not answer your questions, please call your gastroenterologist to clarify.  If you requested that your care partner not be given the details of your procedure findings, then the procedure report has been included in a sealed envelope for you to review at your convenience later.  YOU SHOULD EXPECT: Some feelings of bloating in the abdomen. Passage of more gas than usual.  Walking can help get rid of the air that was put into your GI tract during the procedure and reduce the bloating. If you had a lower endoscopy (such as a colonoscopy or flexible sigmoidoscopy) you may notice spotting of blood in your stool or on the toilet paper. If you underwent a bowel prep for your procedure, you may not have a normal bowel movement for a few days.  Please Note:  You might notice some irritation and congestion in your nose or some drainage.  This is from the oxygen used during your procedure.  There is no need for concern and it should clear up in a day or so.  SYMPTOMS TO REPORT IMMEDIATELY:  Following lower endoscopy (colonoscopy or flexible sigmoidoscopy):  Excessive amounts of blood in the stool  Significant tenderness or worsening of abdominal pains  Swelling of the abdomen that is new, acute  Fever of 100F or higher  For urgent or emergent issues, a gastroenterologist can be reached at any hour by calling [phone]. Do not use MyChart messaging for urgent concerns.    DIET:  We do recommend a small meal at first, but  then you may proceed to your regular diet.  Drink plenty of fluids but you should avoid alcoholic beverages for 24 hours.  ACTIVITY:  You should plan to take it easy for the rest of today and you should NOT DRIVE or use heavy machinery until tomorrow (because of the sedation medicines used during the test).    FOLLOW UP: Our staff will call the number listed on your records the next business day following your procedure.  We will call around 7:15- 8:00 am to check on you and address any questions or concerns that you may have regarding the information given to you following your procedure. If we do not reach you, we will leave a message.     If any biopsies were taken you will be contacted by phone or by letter within the next 1-3 weeks.  Please call us at [phone] if you have not heard about the biopsies in 3 weeks.    SIGNATURES/CONFIDENTIALITY: You and/or your care partner have signed paperwork which will be entered into your electronic medical record.  These signatures attest to the fact that that the information above on your After Visit Summary has been reviewed and is understood.  Full responsibility of the confidentiality of this discharge information lies with you and/or your care-partner.

## 2021-10-21 NOTE — Progress Notes (Signed)
[facility] History and Physical   Primary Care Physician:  Binnie Rail, MD   Reason for Procedure:     Colorectal cancer screening  Plan:     colonoscopy     HPI: [name] is a 71 y.o. male    Past Medical History:  Diagnosis Date   Allergy    Arthritis    Diabetes mellitus without complication ([place])    Diverticulitis of colon    GERD (gastroesophageal reflux disease)    Glaucoma    Gout    Headache    Hypertension    Hypothyroidism    Thyroid disease     Past Surgical History:  Procedure Laterality Date   ANKLE FRACTURE SURGERY     ANTERIOR CERVICAL DECOMP/DISCECTOMY FUSION N/A 04/05/2016   Procedure: C5-6, C6-7 Anterior Cervical Discectomy and Fusion, Allograft, Plate;  Surgeon: Marybelle Killings, MD;  Location: [facility];  Service: Orthopedics;  Laterality: N/A;   ANTERIOR FUSION CERVICAL SPINE  04/05/2016   c6 c7   CARPAL TUNNEL RELEASE Bilateral    JOINT REPLACEMENT  01/07   left knee replaced    JOINT REPLACEMENT  11/10   right knee replaced    KNEE SURGERY     LEFT HEART CATH AND CORONARY ANGIOGRAPHY N/A 02/06/2017   Procedure: LEFT HEART CATH AND CORONARY ANGIOGRAPHY;  Surgeon: Belva Crome, MD;  Location: [facility];  Service: Cardiovascular;  Laterality: N/A;   LEG SURGERY  11/03   broken lower right leg crushed     Prior to Admission medications   Medication Sig Start Date End Date Taking? Authorizing Provider  allopurinol (ZYLOPRIM) 100 MG tablet Take 1 tablet by mouth twice daily 06/27/21  Yes Burns, Claudina Lick, MD  Ascorbic Acid (VITAMIN C WITH ROSE HIPS) 500 MG tablet Take 1,000 mg by mouth daily.   Yes [provider]  atorvastatin (LIPITOR) 10 MG tablet Take 1 tablet (10 mg total) by mouth daily. 06/27/21  Yes Burns, Claudina Lick, MD  Azelastine HCl 0.15 % SOLN Place 1-2 sprays into both nostrils 2 (two) times daily as needed. 12/31/19  Yes Bobbitt, Sedalia Muta, MD  Azelastine HCl 137 MCG/SPRAY SOLN USE 1 TO 2 SPRAY(S)  IN EACH NOSTRIL TWICE DAILY FOR RUNNY NOSE 03/02/21  Yes Valentina Shaggy, MD  B Complex-C (SUPER B COMPLEX PO) Take 1 tablet by mouth daily.   Yes [provider]  Calcium Carbonate-Vitamin D (CALCIUM-VITAMIN D3 PO) Take 1 tablet by mouth every other day.   Yes [provider]  Cholecalciferol (VITAMIN D3) 1000 units CAPS Take 1,000 Units by mouth daily.   Yes [provider]  Garlic 4098 MG CAPS Take 1,000 mg by mouth every other day.   Yes [provider]  glucosamine-chondroitin 500-400 MG tablet Take 2 tablets by mouth daily.   Yes [provider]  levocetirizine (XYZAL) 5 MG tablet Take 10 mg by mouth every evening.   Yes [provider]  levothyroxine (SYNTHROID) 100 MCG tablet Take 1 tablet (100 mcg total) by mouth daily before breakfast. 12/27/20  Yes Burns, Claudina Lick, MD  losartan-hydrochlorothiazide (HYZAAR) 100-12.5 MG tablet Take 1 tablet by mouth daily after breakfast. 06/27/21  Yes Burns, Claudina Lick, MD  Magnesium 100 MG TABS Take 100 mg by mouth every other day.   Yes [provider]  meloxicam (MOBIC) 15 MG tablet Take 1 tablet (15 mg total) by mouth daily as needed for pain. Take with food 06/27/21  Yes Binnie Rail, MD  metFORMIN (GLUCOPHAGE-XR) 500 MG 24 hr tablet Take 1 tablet (500 mg) in the morning and 2 tablets (1000 mg) at night 06/27/21  Yes Burns, Claudina Lick, MD  Multiple Vitamins-Minerals (MULTIVITAMIN ADULTS 50+ PO) Take 1 tablet by mouth daily.   Yes [provider]  pantoprazole (PROTONIX) 40 MG tablet Take 1 tablet by mouth once daily 08/01/21  Yes Burns, Claudina Lick, MD  vitamin E 400 UNIT capsule Take 400 Units by mouth every other day.   Yes [provider]  Zinc 50 MG TABS Take by mouth.   Yes [provider]  Azelastine-Fluticasone 137-50 MCG/ACT SUSP Place 1 spray into the nose 2 (two) times daily as needed. Patient not taking: Reported on 09/16/2019 12/17/18 11/06/19  Bobbitt, Sedalia Muta, MD  fluticasone [facility]) 50 MCG/ACT nasal spray Place 2 sprays into both nostrils daily. Patient not taking: Reported on 09/16/2019 12/17/18 11/06/19  Bobbitt, Sedalia Muta, MD    Current Outpatient Medications  Medication Sig Dispense Refill   allopurinol (ZYLOPRIM) 100 MG tablet Take 1 tablet by mouth twice daily 180 tablet 1   Ascorbic Acid (VITAMIN C WITH ROSE HIPS) 500 MG tablet Take 1,000 mg by mouth daily.     atorvastatin (LIPITOR) 10 MG tablet Take 1 tablet (10 mg total) by mouth daily. 90 tablet 3   Azelastine HCl 0.15 % SOLN Place 1-2 sprays into both nostrils 2 (two) times daily as needed. 30 mL 5   Azelastine HCl 137 MCG/SPRAY SOLN USE 1 TO 2 SPRAY(S) IN EACH NOSTRIL TWICE DAILY FOR RUNNY NOSE 30 mL 0   B Complex-C (SUPER B COMPLEX PO) Take 1 tablet by mouth daily.     Calcium Carbonate-Vitamin D (CALCIUM-VITAMIN D3 PO) Take 1 tablet by mouth every other day.     Cholecalciferol (VITAMIN D3) 1000 units CAPS Take 1,000 Units by mouth daily.     Garlic 5573 MG CAPS Take 1,000 mg by mouth every other day.     glucosamine-chondroitin 500-400 MG tablet Take 2 tablets by mouth daily.     levocetirizine (XYZAL) 5 MG tablet Take 10 mg by mouth every evening.     levothyroxine (SYNTHROID) 100 MCG tablet Take 1 tablet (100 mcg total) by mouth daily before breakfast. 90 tablet 3   losartan-hydrochlorothiazide (HYZAAR) 100-12.5 MG tablet Take 1 tablet by mouth daily after breakfast. 90 tablet 3   Magnesium 100 MG TABS Take 100 mg by mouth every other day.     meloxicam (MOBIC) 15 MG tablet Take 1 tablet (15 mg total) by mouth daily as needed for pain. Take with food 90 tablet 1   metFORMIN (GLUCOPHAGE-XR) 500 MG 24 hr tablet Take 1 tablet (500 mg) in the morning and 2 tablets (1000 mg) at night 270 tablet 3   Multiple Vitamins-Minerals (MULTIVITAMIN ADULTS 50+ PO) Take 1 tablet by mouth daily.     pantoprazole (PROTONIX) 40 MG tablet Take 1 tablet by mouth once daily 90 tablet 0    vitamin E 400 UNIT capsule Take 400 Units by mouth every other day.     Zinc 50 MG TABS Take by mouth.     Current Facility-Administered Medications  Medication Dose Route Frequency Provider Last Rate Last Admin   0.9 %  sodium chloride infusion  500 mL Intravenous Once Jackquline Denmark, MD        Allergies as of 10/21/2021 - Review Complete 10/21/2021  Allergen Reaction Noted   Morphine Other (See Comments)  01/04/2015    Family History  Problem Relation Age of Onset   Hypertension Mother    Diabetes Mother    Heart attack Father    Heart disease Father    Diabetes Paternal Grandmother    Cancer Paternal Grandfather        of eye   Colon cancer Neg Hx    Stomach cancer Neg Hx    Esophageal cancer Neg Hx     Social History   Socioeconomic History   Marital status: Divorced    Spouse name: Not on file   Number of children: Not on file   Years of education: Not on file   Highest education level: Not on file  Occupational History   Not on file  Tobacco Use   Smoking status: Former    Types: Cigarettes    Quit date: 01/10/2008    Years since quitting: 13.7   Smokeless tobacco: Former    Types: Nurse, children's Use: Never used  Substance and Sexual Activity   Alcohol use: Yes    Comment: occasionally   Drug use: No   Sexual activity: Yes    Birth control/protection: None    Comment: Married  Other Topics Concern   Not on file  Social History Narrative   Not on file   Social Determinants of Health   Financial Resource Strain: Low Risk  (07/04/2021)   Overall Financial Resource Strain (CARDIA)    Difficulty of Paying Living Expenses: Not hard at all  Food Insecurity: No Food Insecurity (07/04/2021)   Hunger Vital Sign    Worried About Running Out of Food in the Last Year: Never true    Ran Out of Food in the Last Year: Never true  Transportation Needs: No Transportation Needs (07/04/2021)   PRAPARE - Hydrologist (Medical): No     Lack of Transportation (Non-Medical): No  Physical Activity: Not on file  Stress: No Stress Concern Present (07/04/2021)   [place]    Feeling of Stress : Not at all  Social Connections: Moderately Integrated (07/04/2021)   Social Connection and Isolation Panel [NHANES]    Frequency of Communication with Friends and Family: Twice a week    Frequency of Social Gatherings with Friends and Family: Twice a week    Attends Religious Services: More than 4 times per year    Active Member of [employer] or Organizations: Yes    Attends Music therapist: More than 4 times per year    Marital Status: Divorced  Intimate Partner Violence: Not At Risk (07/04/2021)   Humiliation, Afraid, Rape, and Kick questionnaire    Fear of Current or Ex-Partner: No    Emotionally Abused: No    Physically Abused: No    Sexually Abused: No    Review of Systems: Positive for none All other review of systems negative except as mentioned in the HPI.  Physical Exam: Vital signs in last 24 hours: '@VSRANGES'$ @   General:   Alert,  Well-developed, well-nourished, pleasant and cooperative in NAD Lungs:  Clear throughout to auscultation.   Heart:  Regular rate and rhythm; no murmurs, clicks, rubs,  or gallops. Abdomen:  Soft, nontender and nondistended. Normal bowel sounds.   Neuro/Psych:  Alert and cooperative. Normal mood and affect. A and O x 3    No significant changes were identified.  The patient continues to be an appropriate candidate for  the planned procedure and anesthesia.   Carmell Austria, MD. [facility] 10/21/2021 9:39 AM@

## 2021-10-21 NOTE — Op Note (Signed)
[facility] [place] Patient Name: [name] Procedure Date: 10/21/2021 9:41 AM MRN: [id number] Endoscopist: Jackquline Denmark , MD Age: 71 Referring MD:  Date of Birth: [birth year] Gender: Male Account #: [id number] Procedure:                Colonoscopy Indications:              Screening for colorectal malignant neoplasm Medicines:                Monitored Anesthesia Care Procedure:                Pre-Anesthesia Assessment:                           - Prior to the procedure, a History and Physical                            was performed, and patient medications and                            allergies were reviewed. The patient's tolerance of                            previous anesthesia was also reviewed. The risks                            and benefits of the procedure and the sedation                            options and risks were discussed with the patient.                            All questions were answered, and informed consent                            was obtained. Prior Anticoagulants: The patient has                            taken no previous anticoagulant or antiplatelet                            agents. ASA Grade Assessment: II - A patient with                            mild systemic disease. After reviewing the risks                            and benefits, the patient was deemed in                            satisfactory condition to undergo the procedure.                           After obtaining informed consent, the colonoscope  was passed under direct vision. Throughout the                            procedure, the patient's blood pressure, pulse, and                            oxygen saturations were monitored continuously. The                            Olympus PCF-H190DL (#[id number]) Colonoscope was                            introduced through the anus and advanced to the the                            cecum, identified  by appendiceal orifice and                            ileocecal valve. The colonoscopy was performed                            without difficulty. The patient tolerated the                            procedure well. The quality of the bowel                            preparation was good. The ileocecal valve,                            appendiceal orifice, and rectum were photographed. Scope In: 9:52:06 AM Scope Out: 10:09:50 AM Scope Withdrawal Time: 0 hours 12 minutes 33 seconds  Total Procedure Duration: 0 hours 17 minutes 44 seconds  Findings:                 Two sessile polyps were found in the distal sigmoid                            colon. The polyps were 4 to 6 mm in size. These                            polyps were removed with a cold snare. Resection                            and retrieval were complete.                           Multiple medium-mouthed diverticula were found in                            the sigmoid colon.                           Non-bleeding internal hemorrhoids were found during  retroflexion. The hemorrhoids were small and Grade                            I (internal hemorrhoids that do not prolapse).                           The exam was otherwise without abnormality on                            direct and retroflexion views. Complications:            No immediate complications. Estimated Blood Loss:     Estimated blood loss: none. Impression:               - Two 4 to 6 mm polyps in the distal sigmoid colon,                            removed with a cold snare. Resected and retrieved.                           - Diverticulosis in the sigmoid colon.                           - Non-bleeding internal hemorrhoids.                           - The examination was otherwise normal on direct                            and retroflexion views. Recommendation:           - Patient has a contact number available for                             emergencies. The signs and symptoms of potential                            delayed complications were discussed with the                            patient. Return to normal activities tomorrow.                            Written discharge instructions were provided to the                            patient.                           - High fiber diet.                           - Continue present medications.                           - Await pathology results.                           -  Repeat colonoscopy for surveillance based on                            pathology results.                           - The findings and recommendations were discussed                            with the patient's family. Jackquline Denmark, MD 10/21/2021 10:13:38 AM This report has been signed electronically.

## 2021-10-21 NOTE — Progress Notes (Signed)
Pt resting comfortably. VSS. Airway intact. SBAR complete to RN. All questions answered.   

## 2021-10-21 NOTE — Progress Notes (Signed)
Called to room to assist during endoscopic procedure.  Patient ID and intended procedure confirmed with present staff. Received instructions for my participation in the procedure from the performing physician.  

## 2021-10-21 NOTE — Progress Notes (Signed)
Pt's states no medical or surgical changes since previsit or office visit. 

## 2021-10-23 ENCOUNTER — Other Ambulatory Visit: Payer: Self-pay | Admitting: Internal Medicine

## 2021-10-24 ENCOUNTER — Telehealth: Payer: Self-pay | Admitting: *Deleted

## 2021-10-24 DIAGNOSIS — M9903 Segmental and somatic dysfunction of lumbar region: Secondary | ICD-10-CM | POA: Diagnosis not present

## 2021-10-24 DIAGNOSIS — M9901 Segmental and somatic dysfunction of cervical region: Secondary | ICD-10-CM | POA: Diagnosis not present

## 2021-10-24 DIAGNOSIS — M5431 Sciatica, right side: Secondary | ICD-10-CM | POA: Diagnosis not present

## 2021-10-24 DIAGNOSIS — M5031 Other cervical disc degeneration,  high cervical region: Secondary | ICD-10-CM | POA: Diagnosis not present

## 2021-10-24 NOTE — Telephone Encounter (Signed)
  Follow up Call-     10/21/2021    9:13 AM  Call back number  Post procedure Call Back phone  # [phone]  Permission to leave phone message Yes     Patient questions:  Message left to call if necessary.

## 2021-10-29 ENCOUNTER — Encounter: Payer: Self-pay | Admitting: Gastroenterology

## 2021-11-01 ENCOUNTER — Ambulatory Visit: Payer: Medicare Other

## 2021-11-01 DIAGNOSIS — G4733 Obstructive sleep apnea (adult) (pediatric): Secondary | ICD-10-CM | POA: Diagnosis not present

## 2021-11-07 ENCOUNTER — Ambulatory Visit (INDEPENDENT_AMBULATORY_CARE_PROVIDER_SITE_OTHER): Payer: Medicare Other | Admitting: Nurse Practitioner

## 2021-11-07 ENCOUNTER — Encounter: Payer: Self-pay | Admitting: Nurse Practitioner

## 2021-11-07 VITALS — BP 126/80 | HR 63 | Ht 69.0 in | Wt 196.0 lb

## 2021-11-07 DIAGNOSIS — M5031 Other cervical disc degeneration,  high cervical region: Secondary | ICD-10-CM | POA: Diagnosis not present

## 2021-11-07 DIAGNOSIS — M9903 Segmental and somatic dysfunction of lumbar region: Secondary | ICD-10-CM | POA: Diagnosis not present

## 2021-11-07 DIAGNOSIS — G4733 Obstructive sleep apnea (adult) (pediatric): Secondary | ICD-10-CM

## 2021-11-07 DIAGNOSIS — M5431 Sciatica, right side: Secondary | ICD-10-CM | POA: Diagnosis not present

## 2021-11-07 DIAGNOSIS — M9901 Segmental and somatic dysfunction of cervical region: Secondary | ICD-10-CM | POA: Diagnosis not present

## 2021-11-07 NOTE — Progress Notes (Deleted)
$'@Patient'S$  ID: [name], male    DOB: [birth year], 71 y.o.   MRN: [id number]  No chief complaint on file.   Referring provider: Binnie Rail, MD  HPI: 71 year old male, former smoker followed for OSA. He is a patient of Dr. Bari Mantis and last seen in office 08/25/2021 by [facility] NP. Past medical history significant for HTN, allergic rhinitis, GERD, DM II, hypothyroidism, OA, HLD.   TEST/EVENTS:  07/17/2017 HST: AHI 23.5/hr, SaO2 low 77% 05/23/2021 CT lung cancer screening: atherosclerosis. Mild centrilobular emphysema with diffuse bronchial wall thickening. Lung RADS 2.   01/13/2019: Virtual Visit with Warner Mccreedy NP. Previous visit reported nasal congestion; blood work revealed eosinophilia. He was referred to local allergist. Positive allergens to molds, cat, and dog dander. CPAP compliance subpar. Nasal symptoms improved since he saw his allergist. Continues to struggle with cleaning CPAP routinely. Encouraged to increase usage. Follow up with Dr. Verlin Fester as scheduled. Continue lung cancer screening program.   08/25/2021: Ok Edwards with Khi Mcmillen NP for overdue follow up. He was encouraged to follow up with [place] by his PCP because he has not been on CPAP therapy in a few years now. He stopped wearing the device because he was struggling with persistent nasal congestion and recurrent sinus infections. Previously used nasal mask; never tried any others. He has excessive daytime fatigue symptoms. He wakes in the morning feeling poorly rested. He does have a history of snoring. He will occasionally have a headache in the morning, which he has attributed to his sinuses. He denies drowsy driving, witnessed apneas, sleep parasomnias/paralysis, narcolepsy or cataplexy.  He goes to bed around 10 PM.  Has no issues falling asleep.  Gets up 3-4 times to use the restroom.  Officially gets out of bed in the morning around 6 6:45 AM.  He does not take anything to help him fall asleep at night.  He does drink, drinks with caffeine in  them during the day but not to help keep him awake.  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  Weight has been stable over the past few years.  Previous sleep study was in 2019 and showed moderate OSA.  He is wanting to do something to treat his sleep apnea but is worried about getting back on CPAP given his issues in the past. His sinus symptoms are stable right now. He is currently taking either xyzal or zytec at bedtime and he occasionally uses nasal rinses. He wears a mask when he is mowing. He has not required any abx or prednisone recently. Denies any issues with his breathing or cough.   Allergies  Allergen Reactions   Morphine Other (See Comments)    Severe headache Severe headache Severe headache Severe headache    Immunization History  Administered Date(s) Administered   Influenza, High Dose Seasonal PF 10/12/2017, 11/06/2019   Influenza,inj,Quad PF,6+ Mos 10/11/2018   Influenza-Unspecified 11/16/2020   PFIZER(Purple Top)SARS-COV-2 Vaccination 05/06/2019, 05/26/2019, 12/25/2019   Pneumococcal Conjugate-13 12/24/2018   Pneumococcal Polysaccharide-23 11/12/2017   Tdap 06/27/2017   Zoster Recombinat (Shingrix) 12/27/2017, 03/28/2018    Past Medical History:  Diagnosis Date   Allergy    Arthritis    Diabetes mellitus without complication (HCC)    Diverticulitis of colon    GERD (gastroesophageal reflux disease)    Glaucoma    Gout    Headache    Hypertension    Hypothyroidism    Thyroid  disease     Tobacco History: Social History   Tobacco Use  Smoking Status Former   Types: Cigarettes   Quit date: 01/10/2008   Years since quitting: 13.8  Smokeless Tobacco Former   Types: Chew   Counseling given: Not Answered   Outpatient Medications Prior to Visit  Medication Sig Dispense Refill   allopurinol (ZYLOPRIM) 100 MG tablet Take 1 tablet by mouth twice daily 180 tablet 1   Ascorbic Acid (VITAMIN C  WITH ROSE HIPS) 500 MG tablet Take 1,000 mg by mouth daily.     atorvastatin (LIPITOR) 10 MG tablet Take 1 tablet (10 mg total) by mouth daily. 90 tablet 3   Azelastine HCl 0.15 % SOLN Place 1-2 sprays into both nostrils 2 (two) times daily as needed. 30 mL 5   Azelastine HCl 137 MCG/SPRAY SOLN USE 1 TO 2 SPRAY(S) IN EACH NOSTRIL TWICE DAILY FOR RUNNY NOSE 30 mL 0   B Complex-C (SUPER B COMPLEX PO) Take 1 tablet by mouth daily.     Calcium Carbonate-Vitamin D (CALCIUM-VITAMIN D3 PO) Take 1 tablet by mouth every other day.     Cholecalciferol (VITAMIN D3) 1000 units CAPS Take 1,000 Units by mouth daily.     Garlic 5852 MG CAPS Take 1,000 mg by mouth every other day.     glucosamine-chondroitin 500-400 MG tablet Take 2 tablets by mouth daily.     levocetirizine (XYZAL) 5 MG tablet Take 10 mg by mouth every evening.     levothyroxine (SYNTHROID) 100 MCG tablet Take 1 tablet (100 mcg total) by mouth daily before breakfast. 90 tablet 3   losartan-hydrochlorothiazide (HYZAAR) 100-12.5 MG tablet Take 1 tablet by mouth daily after breakfast. 90 tablet 3   Magnesium 100 MG TABS Take 100 mg by mouth every other day.     meloxicam (MOBIC) 15 MG tablet Take 1 tablet (15 mg total) by mouth daily as needed for pain. Take with food 90 tablet 1   metFORMIN (GLUCOPHAGE-XR) 500 MG 24 hr tablet Take 1 tablet (500 mg) in the morning and 2 tablets (1000 mg) at night 270 tablet 3   Multiple Vitamins-Minerals (MULTIVITAMIN ADULTS 50+ PO) Take 1 tablet by mouth daily.     pantoprazole (PROTONIX) 40 MG tablet Take 1 tablet by mouth once daily 90 tablet 0   vitamin E 400 UNIT capsule Take 400 Units by mouth every other day.     Zinc 50 MG TABS Take by mouth.     No facility-administered medications prior to visit.     Review of Systems:   Constitutional: No weight loss or gain, night sweats, fevers, chills, or lassitude. +Excessive daytime fatigue HEENT: No difficulty swallowing, tooth/dental problems, or sore  throat. No sneezing, itching, ear ache. +chronic nasal congestion, post nasal drip, occasional AM headaches CV:  No chest pain, orthopnea, PND, swelling in lower extremities, anasarca, dizziness, palpitations, syncope Resp: +snoring. No shortness of breath with exertion or at rest. No excess mucus or change in color of mucus. No productive or non-productive. No hemoptysis. No wheezing.  No chest wall deformity GI:  No heartburn, indigestion, abdominal pain, nausea, vomiting, diarrhea, change in bowel habits, loss of appetite, bloody stools.  GU: No dysuria, change in color of urine, urgency or frequency.  No flank pain, no hematuria  Skin: No rash, lesions, ulcerations MSK:  No joint pain or swelling.  No decreased range of motion.  No back pain. Neuro: No dizziness or lightheadedness.  Psych: No depression or anxiety.  Mood stable.     Physical Exam:  There were no vitals taken for this visit.  GEN: Pleasant, interactive, well-appearing; in no acute distress. HEENT:  Normocephalic and atraumatic. PERRLA. Sclera white. Nasal turbinates pink, moist and patent bilaterally. No rhinorrhea present. Oropharynx pink and moist, without exudate or edema. No lesions, ulcerations, or postnasal drip.  NECK:  Supple w/ fair ROM. No JVD present. Normal carotid impulses w/o bruits. Thyroid symmetrical with no goiter or nodules palpated. No lymphadenopathy.   CV: RRR, no m/r/g, no peripheral edema. Pulses intact, +2 bilaterally. No cyanosis, pallor or clubbing. PULMONARY:  Unlabored, regular breathing. Clear bilaterally A&P w/o wheezes/rales/rhonchi. No accessory muscle use. No dullness to percussion. GI: BS present and normoactive. Soft, non-tender to palpation. No organomegaly or masses detected. No CVA tenderness. MSK: No erythema, warmth or tenderness. Cap refil <2 sec all extrem. No deformities or joint swelling noted.  Neuro: A/Ox3. No focal deficits noted.   Skin: Warm, no lesions or rashe Psych:  Normal affect and behavior. Judgement and thought content appropriate.     Lab Results:  CBC    Component Value Date/Time   WBC 7.3 06/27/2021 0820   RBC 4.34 06/27/2021 0820   HGB 12.7 (L) 06/27/2021 0820   HGB 12.8 (L) 01/30/2017 1600   HCT 38.8 (L) 06/27/2021 0820   HCT 37.6 01/30/2017 1600   PLT 285.0 06/27/2021 0820   PLT 315 01/30/2017 1600   MCV 89.5 06/27/2021 0820   MCV 86 01/30/2017 1600   MCH 29.6 04/03/2017 1100   MCHC 32.7 06/27/2021 0820   RDW 15.0 06/27/2021 0820   RDW 13.7 01/30/2017 1600   LYMPHSABS 1.7 06/27/2021 0820   MONOABS 0.7 06/27/2021 0820   EOSABS 1.6 (H) 06/27/2021 0820   BASOSABS 0.1 06/27/2021 0820    BMET    Component Value Date/Time   NA 139 06/27/2021 0820   NA 139 01/30/2017 1600   K 4.3 06/27/2021 0820   CL 105 06/27/2021 0820   CO2 26 06/27/2021 0820   GLUCOSE 112 (H) 06/27/2021 0820   BUN 32 (H) 06/27/2021 0820   BUN 25 01/30/2017 1600   CREATININE 1.32 06/27/2021 0820   CALCIUM 9.6 06/27/2021 0820   GFRNONAA 71 01/30/2017 1600   GFRAA 82 01/30/2017 1600    BNP No results found for: "BNP"   Imaging:  No results found.        No data to display          No results found for: "NITRICOXIDE"      Assessment & Plan:   No problem-specific Assessment & Plan notes found for this encounter.   I spent 38 minutes of dedicated to the care of this patient on the date of this encounter to include pre-visit review of records, face-to-face time with the patient discussing conditions above, post visit ordering of testing, clinical documentation with the electronic health record, making appropriate referrals as documented, and communicating necessary findings to members of the patients care team.  Clayton Bibles, NP 11/07/2021  Pt aware and understands NP's role.

## 2021-11-09 ENCOUNTER — Telehealth: Payer: Self-pay | Admitting: Pulmonary Disease

## 2021-11-09 DIAGNOSIS — G4733 Obstructive sleep apnea (adult) (pediatric): Secondary | ICD-10-CM | POA: Diagnosis not present

## 2021-11-09 NOTE — Telephone Encounter (Signed)
HST showed severe OSA with AHI 33/ hr , lowest sat 75% Consider titration study given his prior issues with CPAP intolerance   If really unable to tolerate after a trial, then consider hypoglossal nerve stimulation therapy

## 2021-11-10 NOTE — Telephone Encounter (Signed)
Please schedule follow up to discuss. Thanks.

## 2021-11-11 NOTE — Telephone Encounter (Signed)
Called and spoke with patient. He verbalized understanding of results. I was able to get him scheduled for 11/13 at 830am (patient will be out of town next week).   Nothing further needed.

## 2021-11-11 NOTE — Telephone Encounter (Signed)
He has severe OSA so prefer to see him sooner, if possible! Thanks!

## 2021-11-11 NOTE — Progress Notes (Signed)
Visit canceled

## 2021-11-11 NOTE — Telephone Encounter (Signed)
Reviewed patient's chart. He is currently scheduled for an OV on 12/07/21 at 830am.   Katie, will this appt be ok or did you want him to be seen sooner?

## 2021-11-21 ENCOUNTER — Encounter: Payer: Self-pay | Admitting: Nurse Practitioner

## 2021-11-21 ENCOUNTER — Ambulatory Visit (INDEPENDENT_AMBULATORY_CARE_PROVIDER_SITE_OTHER): Payer: Medicare Other | Admitting: Nurse Practitioner

## 2021-11-21 VITALS — BP 124/74 | HR 66 | Ht 69.0 in | Wt 197.0 lb

## 2021-11-21 DIAGNOSIS — G4733 Obstructive sleep apnea (adult) (pediatric): Secondary | ICD-10-CM

## 2021-11-21 DIAGNOSIS — J3089 Other allergic rhinitis: Secondary | ICD-10-CM | POA: Diagnosis not present

## 2021-11-21 DIAGNOSIS — M9903 Segmental and somatic dysfunction of lumbar region: Secondary | ICD-10-CM | POA: Diagnosis not present

## 2021-11-21 DIAGNOSIS — M5031 Other cervical disc degeneration,  high cervical region: Secondary | ICD-10-CM | POA: Diagnosis not present

## 2021-11-21 DIAGNOSIS — M9901 Segmental and somatic dysfunction of cervical region: Secondary | ICD-10-CM | POA: Diagnosis not present

## 2021-11-21 DIAGNOSIS — M5431 Sciatica, right side: Secondary | ICD-10-CM | POA: Diagnosis not present

## 2021-11-21 MED ORDER — MONTELUKAST SODIUM 10 MG PO TABS: 10.0000 mg | ORAL_TABLET | Freq: Every day | ORAL | 5 refills | Status: DC

## 2021-11-21 MED ORDER — AZELASTINE HCL 0.15 % NA SOLN: 1.0000 | Freq: Two times a day (BID) | NASAL | 5 refills | Status: AC | PRN

## 2021-11-21 NOTE — Assessment & Plan Note (Addendum)
Allergic rhinitis with history of elevated peripheral eosinophils. Currently maintained on OTC antihistamine. Discussed trial of singulair to see if his chronic symptoms benefit from use. Advised to monitor for any mood changes and stop immediately if these develop. He is also planning to restart astelin - refilled today.

## 2021-11-21 NOTE — Progress Notes (Signed)
$'@Patient'T$  ID: [name], male    DOB: 1950-03-01, 71 y.o.   MRN: [id number]  Chief Complaint  Patient presents with   Follow-up    Pt f/u to discuss HST results.     Referring provider: Binnie Rail, MD  HPI: 71 year old male, former smoker followed for OSA. He is a patient of Dr. Bari Mantis and last seen via virtual visit on 08/25/2021 by [facility] NP. Past medical history significant for HTN, allergic rhinitis, GERD, DM II, hypothyroidism, OA, HLD.   TEST/EVENTS:  07/17/2017 HST: AHI 23.5/hr, SaO2 low 77% 05/23/2021 CT lung cancer screening: atherosclerosis. Mild centrilobular emphysema with diffuse bronchial wall thickening. Lung RADS 2.  11/01/2021 HST: AHI 33/h, SpO2 low 75%  01/13/2019: Virtual Visit with Warner Mccreedy NP. Previous visit reported nasal congestion; blood work revealed eosinophilia. He was referred to local allergist. Positive allergens to molds, cat, and dog dander. CPAP compliance subpar. Nasal symptoms improved since he saw his allergist. Continues to struggle with cleaning CPAP routinely. Encouraged to increase usage. Follow up with Dr. Verlin Fester as scheduled. Continue lung cancer screening program.   08/25/2021: Ok Edwards with Claus Silvestro NP for overdue follow up. He was encouraged to follow up with [place] by his PCP because he has not been on CPAP therapy in a few years now. He stopped wearing the device because he was struggling with persistent nasal congestion and recurrent sinus infections. Previously used nasal mask; never tried any others. He has excessive daytime fatigue symptoms. He wakes in the morning feeling poorly rested. He does have a history of snoring. He will occasionally have a headache in the morning, which he has attributed to his sinuses. He denies drowsy driving, witnessed apneas, sleep parasomnias/paralysis, narcolepsy or cataplexy. Sinus symptoms stable on daily antihistamine. HST ordered for further evaluation.   11/21/2021: Today - follow up Patient presents today for follow  up to discuss home sleep study results, which showed severe obstructive sleep apnea. He feels about the same as he did when he was here last. He continues to feel tried throughout the day and wakes feeling poorly rested. He does want to get restarted on CPAP therapy. His main issue last time was related to recurrent sinus infections. Denies drowsy driving. He does feel like his allergies get worse in fall and spring; takes daily OTC antihistamine. He is not currently using astelin as he ran out of his prescription. No acute worsening of symptoms.   Allergies  Allergen Reactions   Morphine Other (See Comments)    Severe headache Severe headache Severe headache Severe headache    Immunization History  Administered Date(s) Administered   Influenza, High Dose Seasonal PF 10/12/2017, 11/06/2019   Influenza,inj,Quad PF,6+ Mos 10/11/2018   Influenza-Unspecified 11/16/2020   PFIZER(Purple Top)SARS-COV-2 Vaccination 05/06/2019, 05/26/2019, 12/25/2019   Pneumococcal Conjugate-13 12/24/2018   Pneumococcal Polysaccharide-23 11/12/2017   Tdap 06/27/2017   Zoster Recombinat (Shingrix) 12/27/2017, 03/28/2018    Past Medical History:  Diagnosis Date   Allergy    Arthritis    Diabetes mellitus without complication (HCC)    Diverticulitis of colon    GERD (gastroesophageal reflux disease)    Glaucoma    Gout    Headache    Hypertension    Hypothyroidism    Thyroid disease     Tobacco History: Social History   Tobacco Use  Smoking Status Former   Types: Cigarettes   Quit date: 01/10/2008   Years since quitting: 13.8  Smokeless Tobacco Former   Types: Loss adjuster, chartered  Counseling given: Not Answered   Outpatient Medications Prior to Visit  Medication Sig Dispense Refill   allopurinol (ZYLOPRIM) 100 MG tablet Take 1 tablet by mouth twice daily 180 tablet 1   Ascorbic Acid (VITAMIN C WITH ROSE HIPS) 500 MG tablet Take 1,000 mg by mouth daily.     atorvastatin (LIPITOR) 10 MG tablet Take 1 tablet  (10 mg total) by mouth daily. 90 tablet 3   B Complex-C (SUPER B COMPLEX PO) Take 1 tablet by mouth daily.     Calcium Carbonate-Vitamin D (CALCIUM-VITAMIN D3 PO) Take 1 tablet by mouth every other day.     Cholecalciferol (VITAMIN D3) 1000 units CAPS Take 1,000 Units by mouth daily.     Garlic 9449 MG CAPS Take 1,000 mg by mouth every other day.     glucosamine-chondroitin 500-400 MG tablet Take 2 tablets by mouth daily.     levocetirizine (XYZAL) 5 MG tablet Take 10 mg by mouth every evening.     levothyroxine (SYNTHROID) 100 MCG tablet Take 1 tablet (100 mcg total) by mouth daily before breakfast. 90 tablet 3   losartan-hydrochlorothiazide (HYZAAR) 100-12.5 MG tablet Take 1 tablet by mouth daily after breakfast. 90 tablet 3   Magnesium 100 MG TABS Take 100 mg by mouth every other day.     meloxicam (MOBIC) 15 MG tablet Take 1 tablet (15 mg total) by mouth daily as needed for pain. Take with food 90 tablet 1   metFORMIN (GLUCOPHAGE-XR) 500 MG 24 hr tablet Take 1 tablet (500 mg) in the morning and 2 tablets (1000 mg) at night 270 tablet 3   Multiple Vitamins-Minerals (MULTIVITAMIN ADULTS 50+ PO) Take 1 tablet by mouth daily.     pantoprazole (PROTONIX) 40 MG tablet Take 1 tablet by mouth once daily 90 tablet 0   vitamin E 400 UNIT capsule Take 400 Units by mouth every other day.     Zinc 50 MG TABS Take by mouth.     Azelastine HCl 0.15 % SOLN Place 1-2 sprays into both nostrils 2 (two) times daily as needed. 30 mL 5   Azelastine HCl 137 MCG/SPRAY SOLN USE 1 TO 2 SPRAY(S) IN EACH NOSTRIL TWICE DAILY FOR RUNNY NOSE 30 mL 0   No facility-administered medications prior to visit.     Review of Systems:   Constitutional: No weight loss or gain, night sweats, fevers, chills, or lassitude. +Excessive daytime fatigue HEENT: No difficulty swallowing, tooth/dental problems, or sore throat. No sneezing, itching, ear ache. +chronic nasal congestion, post nasal drip, occasional AM headaches CV:  No  chest pain, orthopnea, PND, swelling in lower extremities, anasarca, dizziness, palpitations, syncope Resp: +snoring. No shortness of breath with exertion or at rest. No excess mucus or change in color of mucus. No productive or non-productive. No hemoptysis. No wheezing.  No chest wall deformity GI:  No heartburn, indigestion, abdominal pain, nausea, vomiting, diarrhea, change in bowel habits, loss of appetite, bloody stools.  GU: No dysuria, change in color of urine, urgency or frequency.  No flank pain, no hematuria  Skin: No rash, lesions, ulcerations MSK:  No joint pain or swelling.  No decreased range of motion.  No back pain. Neuro: No dizziness or lightheadedness.  Psych: No depression or anxiety. Mood stable.     Physical Exam:  BP 124/74   Pulse 66   Ht '5\' 9"'$  (1.753 m)   Wt 197 lb (89.4 kg)   SpO2 98%   BMI 29.09 kg/m   GEN: Pleasant,  interactive, well-appearing; in no acute distress. HEENT:  Normocephalic and atraumatic. PERRLA. Sclera white. Nasal turbinates pink, moist and patent bilaterally. No rhinorrhea present. Oropharynx pink and moist, without exudate or edema. No lesions, ulcerations, or postnasal drip.  NECK:  Supple w/ fair ROM. No JVD present. Normal carotid impulses w/o bruits. Thyroid symmetrical with no goiter or nodules palpated. No lymphadenopathy.   CV: RRR, no m/r/g, no peripheral edema. Pulses intact, +2 bilaterally. No cyanosis, pallor or clubbing. PULMONARY:  Unlabored, regular breathing. Clear bilaterally A&P w/o wheezes/rales/rhonchi. No accessory muscle use. No dullness to percussion. GI: BS present and normoactive. Soft, non-tender to palpation. No organomegaly or masses detected. No CVA tenderness. MSK: No erythema, warmth or tenderness. Cap refil <2 sec all extrem. No deformities or joint swelling noted.  Neuro: A/Ox3. No focal deficits noted.   Skin: Warm, no lesions or rashe Psych: Normal affect and behavior. Judgement and thought content  appropriate.     Lab Results:  CBC    Component Value Date/Time   WBC 7.3 06/27/2021 0820   RBC 4.34 06/27/2021 0820   HGB 12.7 (L) 06/27/2021 0820   HGB 12.8 (L) 01/30/2017 1600   HCT 38.8 (L) 06/27/2021 0820   HCT 37.6 01/30/2017 1600   PLT 285.0 06/27/2021 0820   PLT 315 01/30/2017 1600   MCV 89.5 06/27/2021 0820   MCV 86 01/30/2017 1600   MCH 29.6 04/03/2017 1100   MCHC 32.7 06/27/2021 0820   RDW 15.0 06/27/2021 0820   RDW 13.7 01/30/2017 1600   LYMPHSABS 1.7 06/27/2021 0820   MONOABS 0.7 06/27/2021 0820   EOSABS 1.6 (H) 06/27/2021 0820   BASOSABS 0.1 06/27/2021 0820    BMET    Component Value Date/Time   NA 139 06/27/2021 0820   NA 139 01/30/2017 1600   K 4.3 06/27/2021 0820   CL 105 06/27/2021 0820   CO2 26 06/27/2021 0820   GLUCOSE 112 (H) 06/27/2021 0820   BUN 32 (H) 06/27/2021 0820   BUN 25 01/30/2017 1600   CREATININE 1.32 06/27/2021 0820   CALCIUM 9.6 06/27/2021 0820   GFRNONAA 71 01/30/2017 1600   GFRAA 82 01/30/2017 1600    BNP No results found for: "BNP"   Imaging:  No results found.        No data to display          No results found for: "NITRICOXIDE"      Assessment & Plan:   OSA (obstructive sleep apnea) Severe OSA with AHI 33/h. Previously on CPAP but difficulties tolerating due to recurrent sinusitis. He only tried a nasal mask in the past. We discussed how untreated sleep apnea puts an individual at risk for cardiac arrhthymias, pulm HTN, DM, stroke and increases their risk for daytime accidents. We also briefly reviewed treatment options including weight loss, side sleeping position, oral appliance, CPAP therapy or referral to ENT for possible surgical options. He would like to move forward with restarting CPAP - orders sent for auto CPAP 5-15 cmH2O and dreamwear full face mask. If he continues to have trouble with adjusting to use, we will obtain CPAP desensitization study.  Patient Instructions  Continue over the  counter allergy medicine as directed  Continue saline nasal rinses 1-2 times a day for nasal congestion  Restart astelin nasal spray 2 sprays each nostril up to twice daily Montelukast (singulair) 1 tab At bedtime for allergies  Start CPAP auto 5-15 cmH2O every night, minimum of 4-6 hours a night.  Change equipment every  30 days or as directed by DME. Wash your tubing with warm soap and water daily, hang to dry. Wash humidifier portion weekly.  Be aware of reduced alertness and do not drive or operate heavy machinery if experiencing this or drowsiness.  Exercise encouraged, as tolerated. Notify if persistent daytime sleepiness occurs even with consistent use of CPAP.  Follow up in 10-12 weeks with Dr. Elsworth Soho or Alanson Aly. If symptoms worsen, please contact office for sooner follow up or seek emergency care.    Perennial allergic rhinitis with possible nonallergic component Allergic rhinitis with history of elevated peripheral eosinophils. Currently maintained on OTC antihistamine. Discussed trial of singulair to see if his chronic symptoms benefit from use. Advised to monitor for any mood changes and stop immediately if these develop. He is also planning to restart astelin - refilled today.   I spent 32 minutes of dedicated to the care of this patient on the date of this encounter to include pre-visit review of records, face-to-face time with the patient discussing conditions above, post visit ordering of testing, clinical documentation with the electronic health record, making appropriate referrals as documented, and communicating necessary findings to members of the patients care team.  Clayton Bibles, NP 11/21/2021  Pt aware and understands NP's role.

## 2021-11-21 NOTE — Patient Instructions (Addendum)
Continue over the counter allergy medicine as directed  Continue saline nasal rinses 1-2 times a day for nasal congestion  Restart astelin nasal spray 2 sprays each nostril up to twice daily Montelukast (singulair) 1 tab At bedtime for allergies  Start CPAP auto 5-15 cmH2O every night, minimum of 4-6 hours a night.  Change equipment every 30 days or as directed by DME. Wash your tubing with warm soap and water daily, hang to dry. Wash humidifier portion weekly.  Be aware of reduced alertness and do not drive or operate heavy machinery if experiencing this or drowsiness.  Exercise encouraged, as tolerated. Notify if persistent daytime sleepiness occurs even with consistent use of CPAP.  Follow up in 10-12 weeks with Dr. Elsworth Soho or Alanson Aly. If symptoms worsen, please contact office for sooner follow up or seek emergency care.

## 2021-11-21 NOTE — Assessment & Plan Note (Signed)
Severe OSA with AHI 33/h. Previously on CPAP but difficulties tolerating due to recurrent sinusitis. He only tried a nasal mask in the past. We discussed how untreated sleep apnea puts an individual at risk for cardiac arrhthymias, pulm HTN, DM, stroke and increases their risk for daytime accidents. We also briefly reviewed treatment options including weight loss, side sleeping position, oral appliance, CPAP therapy or referral to ENT for possible surgical options. He would like to move forward with restarting CPAP - orders sent for auto CPAP 5-15 cmH2O and dreamwear full face mask. If he continues to have trouble with adjusting to use, we will obtain CPAP desensitization study.  Patient Instructions  Continue over the counter allergy medicine as directed  Continue saline nasal rinses 1-2 times a day for nasal congestion  Restart astelin nasal spray 2 sprays each nostril up to twice daily Montelukast (singulair) 1 tab At bedtime for allergies  Start CPAP auto 5-15 cmH2O every night, minimum of 4-6 hours a night.  Change equipment every 30 days or as directed by DME. Wash your tubing with warm soap and water daily, hang to dry. Wash humidifier portion weekly.  Be aware of reduced alertness and do not drive or operate heavy machinery if experiencing this or drowsiness.  Exercise encouraged, as tolerated. Notify if persistent daytime sleepiness occurs even with consistent use of CPAP.  Follow up in 10-12 weeks with Dr. Elsworth Soho or Alanson Aly. If symptoms worsen, please contact office for sooner follow up or seek emergency care.

## 2021-12-05 DIAGNOSIS — M9901 Segmental and somatic dysfunction of cervical region: Secondary | ICD-10-CM | POA: Diagnosis not present

## 2021-12-05 DIAGNOSIS — M5031 Other cervical disc degeneration,  high cervical region: Secondary | ICD-10-CM | POA: Diagnosis not present

## 2021-12-05 DIAGNOSIS — M5431 Sciatica, right side: Secondary | ICD-10-CM | POA: Diagnosis not present

## 2021-12-05 DIAGNOSIS — M9903 Segmental and somatic dysfunction of lumbar region: Secondary | ICD-10-CM | POA: Diagnosis not present

## 2021-12-07 ENCOUNTER — Ambulatory Visit: Payer: Medicare Other | Admitting: Nurse Practitioner

## 2021-12-19 DIAGNOSIS — M5031 Other cervical disc degeneration,  high cervical region: Secondary | ICD-10-CM | POA: Diagnosis not present

## 2021-12-19 DIAGNOSIS — M9901 Segmental and somatic dysfunction of cervical region: Secondary | ICD-10-CM | POA: Diagnosis not present

## 2021-12-19 DIAGNOSIS — M5431 Sciatica, right side: Secondary | ICD-10-CM | POA: Diagnosis not present

## 2021-12-19 DIAGNOSIS — M9903 Segmental and somatic dysfunction of lumbar region: Secondary | ICD-10-CM | POA: Diagnosis not present

## 2021-12-26 ENCOUNTER — Ambulatory Visit: Payer: Medicare Other | Admitting: Internal Medicine

## 2021-12-26 NOTE — Patient Instructions (Addendum)
      Blood work was ordered.   The lab is on the first floor.    Medications changes include :   Augmentin twice a day for 10 days.      Return in about 6 months (around 06/28/2022) for follow up.

## 2021-12-26 NOTE — Progress Notes (Unsigned)
Subjective:    Patient ID: [name], male    DOB: September 24, 1950, 71 y.o.   MRN: [id number]     HPI Osie is here for follow up of his chronic medical problems, including DM, htn, hld, hypothyroid, GERD, gout, hand OA  Sore throat, headaches, burning in airway and nostril when he breaths.  He has nasal congestion and sinus pain.  Medications and allergies reviewed with patient and updated if appropriate.  Current Outpatient Medications on File Prior to Visit  Medication Sig Dispense Refill   allopurinol (ZYLOPRIM) 100 MG tablet Take 1 tablet by mouth twice daily 180 tablet 1   Ascorbic Acid (VITAMIN C WITH ROSE HIPS) 500 MG tablet Take 1,000 mg by mouth daily.     atorvastatin (LIPITOR) 10 MG tablet Take 1 tablet (10 mg total) by mouth daily. 90 tablet 3   Azelastine HCl 0.15 % SOLN Place 1-2 sprays into both nostrils 2 (two) times daily as needed. 30 mL 5   B Complex-C (SUPER B COMPLEX PO) Take 1 tablet by mouth daily.     Calcium Carbonate-Vitamin D (CALCIUM-VITAMIN D3 PO) Take 1 tablet by mouth every other day.     Cholecalciferol (VITAMIN D3) 1000 units CAPS Take 1,000 Units by mouth daily.     Garlic 8938 MG CAPS Take 1,000 mg by mouth every other day.     glucosamine-chondroitin 500-400 MG tablet Take 2 tablets by mouth daily.     levocetirizine (XYZAL) 5 MG tablet Take 10 mg by mouth every evening.     levothyroxine (SYNTHROID) 100 MCG tablet Take 1 tablet (100 mcg total) by mouth daily before breakfast. 90 tablet 3   losartan-hydrochlorothiazide (HYZAAR) 100-12.5 MG tablet Take 1 tablet by mouth daily after breakfast. 90 tablet 3   Magnesium 100 MG TABS Take 100 mg by mouth every other day.     meloxicam (MOBIC) 15 MG tablet Take 1 tablet (15 mg total) by mouth daily as needed for pain. Take with food 90 tablet 1   metFORMIN (GLUCOPHAGE-XR) 500 MG 24 hr tablet Take 1 tablet (500 mg) in the morning and 2 tablets (1000 mg) at night 270 tablet 3   montelukast  (SINGULAIR) 10 MG tablet Take 1 tablet (10 mg total) by mouth at bedtime. 30 tablet 5   Multiple Vitamins-Minerals (MULTIVITAMIN ADULTS 50+ PO) Take 1 tablet by mouth daily.     pantoprazole (PROTONIX) 40 MG tablet Take 1 tablet by mouth once daily 90 tablet 0   vitamin E 400 UNIT capsule Take 400 Units by mouth every other day.     Zinc 50 MG TABS Take by mouth.     [DISCONTINUED] Azelastine-Fluticasone 137-50 MCG/ACT SUSP Place 1 spray into the nose 2 (two) times daily as needed. (Patient not taking: Reported on 09/16/2019) 23 g 2   [DISCONTINUED] fluticasone (FLONASE) 50 MCG/ACT nasal spray Place 2 sprays into both nostrils daily. (Patient not taking: Reported on 09/16/2019) 1 g 5   No current facility-administered medications on file prior to visit.     Review of Systems  Constitutional:  Negative for chills and fever.  HENT:  Positive for congestion, sinus pressure and sore throat (on one side). Negative for ear pain (ears clogged).   Respiratory:  Positive for cough (bring up some discolored mucus). Negative for shortness of breath and wheezing.   Cardiovascular:  Negative for chest pain, palpitations and leg swelling.  Neurological:  Positive for light-headedness (occ if stands up quick)  and headaches (constant). Negative for dizziness.       Objective:   Vitals:   12/27/21 0805  BP: 130/80  Pulse: 82  Temp: 98.2 F (36.8 C)  SpO2: 98%   BP Readings from Last 3 Encounters:  12/27/21 130/80  11/21/21 124/74  11/07/21 126/80   Wt Readings from Last 3 Encounters:  12/27/21 196 lb (88.9 kg)  11/21/21 197 lb (89.4 kg)  11/07/21 196 lb (88.9 kg)   Body mass index is 28.94 kg/m.    Physical Exam Constitutional:      General: He is not in acute distress.    Appearance: Normal appearance. He is not ill-appearing.  HENT:     Head: Normocephalic and atraumatic.     Right Ear: Tympanic membrane, ear canal and external ear normal. There is no impacted cerumen.     Left Ear:  Tympanic membrane, ear canal and external ear normal. There is no impacted cerumen.     Mouth/Throat:     Mouth: Mucous membranes are moist.     Pharynx: No oropharyngeal exudate or posterior oropharyngeal erythema.  Eyes:     Conjunctiva/sclera: Conjunctivae normal.  Cardiovascular:     Rate and Rhythm: Normal rate and regular rhythm.     Heart sounds: Normal heart sounds. No murmur heard. Pulmonary:     Effort: Pulmonary effort is normal. No respiratory distress.     Breath sounds: Normal breath sounds. No wheezing or rales.  Musculoskeletal:     Cervical back: Neck supple. No tenderness.     Right lower leg: No edema.     Left lower leg: No edema.  Lymphadenopathy:     Cervical: No cervical adenopathy.  Skin:    General: Skin is warm and dry.     Findings: No rash.  Neurological:     Mental Status: He is alert. Mental status is at baseline.  Psychiatric:        Mood and Affect: Mood normal.        Lab Results  Component Value Date   WBC 7.3 06/27/2021   HGB 12.7 (L) 06/27/2021   HCT 38.8 (L) 06/27/2021   PLT 285.0 06/27/2021   GLUCOSE 112 (H) 06/27/2021   CHOL 147 06/27/2021   TRIG 129.0 06/27/2021   HDL 37.20 (L) 06/27/2021   LDLCALC 84 06/27/2021   ALT 14 06/27/2021   AST 19 06/27/2021   NA 139 06/27/2021   K 4.3 06/27/2021   CL 105 06/27/2021   CREATININE 1.32 06/27/2021   BUN 32 (H) 06/27/2021   CO2 26 06/27/2021   TSH 4.50 06/27/2021   PSA 0.29 12/27/2020   INR 1.0 01/30/2017   HGBA1C 7.0 (H) 06/27/2021   MICROALBUR 13.1 (H) 06/27/2021     Assessment & Plan:    See Problem List for Assessment and Plan of chronic medical problems.

## 2021-12-27 ENCOUNTER — Encounter: Payer: Self-pay | Admitting: Internal Medicine

## 2021-12-27 ENCOUNTER — Ambulatory Visit (INDEPENDENT_AMBULATORY_CARE_PROVIDER_SITE_OTHER): Payer: Medicare Other | Admitting: Internal Medicine

## 2021-12-27 VITALS — BP 130/80 | HR 82 | Temp 98.2°F | Ht 69.0 in | Wt 196.0 lb

## 2021-12-27 DIAGNOSIS — K219 Gastro-esophageal reflux disease without esophagitis: Secondary | ICD-10-CM | POA: Diagnosis not present

## 2021-12-27 DIAGNOSIS — I2510 Atherosclerotic heart disease of native coronary artery without angina pectoris: Secondary | ICD-10-CM | POA: Diagnosis not present

## 2021-12-27 DIAGNOSIS — E118 Type 2 diabetes mellitus with unspecified complications: Secondary | ICD-10-CM

## 2021-12-27 DIAGNOSIS — M159 Polyosteoarthritis, unspecified: Secondary | ICD-10-CM | POA: Diagnosis not present

## 2021-12-27 DIAGNOSIS — I10 Essential (primary) hypertension: Secondary | ICD-10-CM | POA: Diagnosis not present

## 2021-12-27 DIAGNOSIS — J019 Acute sinusitis, unspecified: Secondary | ICD-10-CM | POA: Diagnosis not present

## 2021-12-27 DIAGNOSIS — E039 Hypothyroidism, unspecified: Secondary | ICD-10-CM

## 2021-12-27 DIAGNOSIS — M109 Gout, unspecified: Secondary | ICD-10-CM | POA: Diagnosis not present

## 2021-12-27 DIAGNOSIS — E782 Mixed hyperlipidemia: Secondary | ICD-10-CM | POA: Diagnosis not present

## 2021-12-27 LAB — COMPREHENSIVE METABOLIC PANEL
ALT: 19 U/L (ref 0–53)
AST: 23 U/L (ref 0–37)
Albumin: 4.2 g/dL (ref 3.5–5.2)
Alkaline Phosphatase: 71 U/L (ref 39–117)
BUN: 23 mg/dL (ref 6–23)
CO2: 28 mEq/L (ref 19–32)
Calcium: 10 mg/dL (ref 8.4–10.5)
Chloride: 101 mEq/L (ref 96–112)
Creatinine, Ser: 1.25 mg/dL (ref 0.40–1.50)
GFR: 58.91 mL/min — ABNORMAL LOW (ref >60.00)
Glucose, Bld: 124 mg/dL — ABNORMAL HIGH (ref 70–99)
Potassium: 4.8 mEq/L (ref 3.5–5.1)
Sodium: 137 mEq/L (ref 135–145)
Total Bilirubin: 0.4 mg/dL (ref 0.2–1.2)
Total Protein: 7.4 g/dL (ref 6.0–8.3)

## 2021-12-27 LAB — CBC WITH DIFFERENTIAL/PLATELET
Basophils Absolute: 0.1 10*3/uL (ref 0.0–0.1)
Basophils Relative: 1.1 % (ref 0.0–3.0)
Eosinophils Absolute: 0.3 10*3/uL (ref 0.0–0.7)
Eosinophils Relative: 6 % — ABNORMAL HIGH (ref 0.0–5.0)
HCT: 43.1 % (ref 39.0–52.0)
Hemoglobin: 14.2 g/dL (ref 13.0–17.0)
Lymphocytes Relative: 25.5 % (ref 12.0–46.0)
Lymphs Abs: 1.2 10*3/uL (ref 0.7–4.0)
MCHC: 33 g/dL (ref 30.0–36.0)
MCV: 88.9 fl (ref 78.0–100.0)
Monocytes Absolute: 0.8 10*3/uL (ref 0.1–1.0)
Monocytes Relative: 17.1 % — ABNORMAL HIGH (ref 3.0–12.0)
Neutro Abs: 2.4 10*3/uL (ref 1.4–7.7)
Neutrophils Relative %: 50.3 % (ref 43.0–77.0)
Platelets: 266 10*3/uL (ref 150.0–400.0)
RBC: 4.85 Mil/uL (ref 4.22–5.81)
RDW: 14.8 % (ref 11.5–15.5)
WBC: 4.8 10*3/uL (ref 4.0–10.5)

## 2021-12-27 LAB — LIPID PANEL
Cholesterol: 113 mg/dL (ref 0–200)
HDL: 42.2 mg/dL (ref >39.00)
LDL Cholesterol: 51 mg/dL (ref 0–99)
NonHDL: 71.1
Total CHOL/HDL Ratio: 3
Triglycerides: 99 mg/dL (ref 0.0–149.0)
VLDL: 19.8 mg/dL (ref 0.0–40.0)

## 2021-12-27 LAB — TSH: TSH: 7.21 u[IU]/mL — ABNORMAL HIGH (ref 0.35–5.50)

## 2021-12-27 LAB — HEMOGLOBIN A1C: Hgb A1c MFr Bld: 7.2 % — ABNORMAL HIGH (ref 4.6–6.5)

## 2021-12-27 MED ORDER — ALLOPURINOL 100 MG PO TABS: ORAL_TABLET | ORAL | 1 refills | Status: DC

## 2021-12-27 MED ORDER — LEVOTHYROXINE SODIUM 112 MCG PO TABS: 112.0000 ug | ORAL_TABLET | Freq: Every day | ORAL | 3 refills | Status: DC

## 2021-12-27 MED ORDER — PANTOPRAZOLE SODIUM 40 MG PO TBEC: 40.0000 mg | DELAYED_RELEASE_TABLET | Freq: Every day | ORAL | 3 refills | Status: DC

## 2021-12-27 MED ORDER — METFORMIN HCL ER 500 MG PO TB24: 1000.0000 mg | ORAL_TABLET | Freq: Two times a day (BID) | ORAL | 3 refills | Status: DC

## 2021-12-27 MED ORDER — AMOXICILLIN-POT CLAVULANATE 875-125 MG PO TABS: 1.0000 | ORAL_TABLET | Freq: Two times a day (BID) | ORAL | 0 refills | Status: DC

## 2021-12-27 NOTE — Assessment & Plan Note (Signed)
Chronic Controlled, Stable Continue allopurinol 100 mg twice daily

## 2021-12-27 NOTE — Assessment & Plan Note (Signed)
Chronic  Clinically euthyroid Check tsh and will titrate med dose if needed Currently taking levothyroxine 100 mcg daily   

## 2021-12-27 NOTE — Addendum Note (Signed)
Addended by: Binnie Rail on: 12/27/2021 05:00 PM   Modules accepted: Orders

## 2021-12-27 NOTE — Assessment & Plan Note (Signed)
Chronic GERD controlled Continue pantoprazole 40 mg daily 

## 2021-12-27 NOTE — Assessment & Plan Note (Signed)
Chronic °Blood pressure well controlled °CMP °Continue losartan-HCTZ 100-12.5 mg daily °

## 2021-12-27 NOTE — Assessment & Plan Note (Signed)
Chronic Regular exercise and healthy diet encouraged Check lipid panel  Continue atorvastatin 10 mg daily 

## 2021-12-27 NOTE — Assessment & Plan Note (Signed)
Chronic   Lab Results  Component Value Date   HGBA1C 7.0 (H) 06/27/2021   Sugars not ideally controlled Testing sugars 1 times a day Check A1c Continue metformin XR 500 mg in the morning and 1000 mg at night Stressed regular exercise, diabetic diet

## 2021-12-27 NOTE — Assessment & Plan Note (Signed)
Acute Likely bacterial  Start Augmentin 875-125 mg BID x 10 day otc cold medications Rest, fluid Call if no improvement  

## 2021-12-27 NOTE — Assessment & Plan Note (Signed)
Chronic Continue meloxicam 15 mg daily as needed-discussed avoiding regular use because of possible side effects

## 2022-01-13 ENCOUNTER — Telehealth: Payer: No Typology Code available for payment source | Admitting: Physician Assistant

## 2022-01-13 ENCOUNTER — Encounter: Payer: Self-pay | Admitting: Internal Medicine

## 2022-01-13 DIAGNOSIS — B9689 Other specified bacterial agents as the cause of diseases classified elsewhere: Secondary | ICD-10-CM

## 2022-01-13 DIAGNOSIS — J019 Acute sinusitis, unspecified: Secondary | ICD-10-CM

## 2022-01-13 MED ORDER — DOXYCYCLINE HYCLATE 100 MG PO TABS: 100.0000 mg | ORAL_TABLET | Freq: Two times a day (BID) | ORAL | 0 refills | Status: DC

## 2022-01-13 NOTE — Progress Notes (Signed)
E-Visit for Sinus Problems  We are sorry that you are not feeling well.  Here is how we plan to help!  Based on what you have shared with me it looks like you have sinusitis.  Sinusitis is inflammation and infection in the sinus cavities of the head.  Based on your presentation I believe you most likely have Acute Bacterial Sinusitis.  This is an infection caused by bacteria and is treated with antibiotics. I have prescribed Doxycycline '100mg'$  by mouth twice a day for 10 days. You may use an oral decongestant such as Mucinex D or if you have glaucoma or high blood pressure use plain Mucinex. Saline nasal spray help and can safely be used as often as needed for congestion.  If you develop worsening sinus pain, fever or notice severe headache and vision changes, or if symptoms are not better after completion of antibiotic, please schedule an appointment with a health care provider.    Sinus infections are not as easily transmitted as other respiratory infection, however we still recommend that you avoid close contact with loved ones, especially the very young and elderly.  Remember to wash your hands thoroughly throughout the day as this is the number one way to prevent the spread of infection!  Being that this is the second round of antibiotics in less than a month, if symptoms continue or recur please follow up in person.    Home Care: Only take medications as instructed by your medical team. Complete the entire course of an antibiotic. Do not take these medications with alcohol. A steam or ultrasonic humidifier can help congestion.  You can place a towel over your head and breathe in the steam from hot water coming from a faucet. Avoid close contacts especially the very young and the elderly. Cover your mouth when you cough or sneeze. Always remember to wash your hands.  Get Help Right Away If: You develop worsening fever or sinus pain. You develop a severe head ache or visual changes. Your  symptoms persist after you have completed your treatment plan.  Make sure you Understand these instructions. Will watch your condition. Will get help right away if you are not doing well or get worse.  Thank you for choosing an e-visit.  Your e-visit answers were reviewed by a board certified advanced clinical practitioner to complete your personal care plan. Depending upon the condition, your plan could have included both over the counter or prescription medications.  Please review your pharmacy choice. Make sure the pharmacy is open so you can pick up prescription now. If there is a problem, you may contact your provider through [employer] and have the prescription routed to another pharmacy.  Your safety is important to [place]. If you have drug allergies check your prescription carefully.   For the next 24 hours you can use MyChart to ask questions about today's visit, request a non-urgent call back, or ask for a work or school excuse. You will get an email in the next two days asking about your experience. I hope that your e-visit has been valuable and will speed your recovery.  I have spent 5 minutes in review of e-visit questionnaire, review and updating patient chart, medical decision making and response to patient.   Mar Daring, PA-C

## 2022-01-18 DIAGNOSIS — M9901 Segmental and somatic dysfunction of cervical region: Secondary | ICD-10-CM | POA: Diagnosis not present

## 2022-01-18 DIAGNOSIS — M5031 Other cervical disc degeneration,  high cervical region: Secondary | ICD-10-CM | POA: Diagnosis not present

## 2022-01-18 DIAGNOSIS — M5431 Sciatica, right side: Secondary | ICD-10-CM | POA: Diagnosis not present

## 2022-01-18 DIAGNOSIS — M9903 Segmental and somatic dysfunction of lumbar region: Secondary | ICD-10-CM | POA: Diagnosis not present

## 2022-01-30 DIAGNOSIS — M9901 Segmental and somatic dysfunction of cervical region: Secondary | ICD-10-CM | POA: Diagnosis not present

## 2022-01-30 DIAGNOSIS — M5431 Sciatica, right side: Secondary | ICD-10-CM | POA: Diagnosis not present

## 2022-01-30 DIAGNOSIS — M5031 Other cervical disc degeneration,  high cervical region: Secondary | ICD-10-CM | POA: Diagnosis not present

## 2022-01-30 DIAGNOSIS — M9903 Segmental and somatic dysfunction of lumbar region: Secondary | ICD-10-CM | POA: Diagnosis not present

## 2022-02-13 DIAGNOSIS — M5031 Other cervical disc degeneration,  high cervical region: Secondary | ICD-10-CM | POA: Diagnosis not present

## 2022-02-13 DIAGNOSIS — M9903 Segmental and somatic dysfunction of lumbar region: Secondary | ICD-10-CM | POA: Diagnosis not present

## 2022-02-13 DIAGNOSIS — M5431 Sciatica, right side: Secondary | ICD-10-CM | POA: Diagnosis not present

## 2022-02-13 DIAGNOSIS — M9901 Segmental and somatic dysfunction of cervical region: Secondary | ICD-10-CM | POA: Diagnosis not present

## 2022-02-15 ENCOUNTER — Other Ambulatory Visit: Payer: Self-pay

## 2022-02-15 ENCOUNTER — Encounter: Payer: Self-pay | Admitting: Internal Medicine

## 2022-02-15 MED ORDER — METFORMIN HCL ER 500 MG PO TB24: 1000.0000 mg | ORAL_TABLET | Freq: Two times a day (BID) | ORAL | 3 refills | Status: DC

## 2022-02-22 ENCOUNTER — Ambulatory Visit (INDEPENDENT_AMBULATORY_CARE_PROVIDER_SITE_OTHER): Payer: Medicare Other | Admitting: Pulmonary Disease

## 2022-02-22 ENCOUNTER — Encounter (HOSPITAL_BASED_OUTPATIENT_CLINIC_OR_DEPARTMENT_OTHER): Payer: Self-pay | Admitting: Pulmonary Disease

## 2022-02-22 VITALS — BP 140/82 | HR 70 | Temp 98.2°F | Ht 69.0 in | Wt 202.6 lb

## 2022-02-22 DIAGNOSIS — G4733 Obstructive sleep apnea (adult) (pediatric): Secondary | ICD-10-CM | POA: Diagnosis not present

## 2022-02-22 DIAGNOSIS — J321 Chronic frontal sinusitis: Secondary | ICD-10-CM | POA: Diagnosis not present

## 2022-02-22 NOTE — Assessment & Plan Note (Signed)
Refer to ENT. He will continue nasal steroid, azelastine nasal spray and saline rinses.  Singulair has had no effect and he can discontinue. He will continue Xyzal OTC

## 2022-02-22 NOTE — Patient Instructions (Signed)
x ENT referral for chronic sinusitis and inspire implant

## 2022-02-22 NOTE — Assessment & Plan Note (Signed)
We discussed alternative treatments to CPAP since he is unable to tolerate due to repeated sinus infections.  I feel that dental appliance would be suboptimal.  Positional therapy alone would not suffice but perhaps a combination would be helpful. Also discussed hypoglossal nerve stimulator implant.  He is a candidate in terms of BMI but is understandably hesitant to get an implant.  He will discuss more with his daughters. Will make him an ENT referral

## 2022-02-22 NOTE — Progress Notes (Signed)
   Subjective:    Patient ID: [name], male    DOB: Oct 28, 1950, 72 y.o.   MRN: [id number]  HPI 72 year old man for follow-up of OSA  PMH - HTN, allergic rhinitis, GERD, DM II, hypothyroidism,  He smoked about 2 packs/day for 40 years before he quit in 2011.  He is a former Dealer at [employer]; now has his own [place] and uses a Optometrist Complaint  Patient presents with   Follow-up    Last time wore cpap was dec 2023   He was initially started on CPAP in 2019.  He was retested in 2023.  He is settled down with a fullface mask but has continued to have recurrent sinus infections.  His last such infection was in December when he had amoxicillin.  He tried changing the tube to a heated humidifier.  He is also does change mask and is now on an AirFit F30 but he continues to have this problem. His presenting complaints were snoring and excessive daytime fatigue and CPAP is certainly helped solve this problem. He has seen an allergist, he was started on Singulair last visit without significant improvement. He continues to use Flonase and azelastine nasal spray he also uses saline rinses  Significant tests/ events reviewed  10/2021 HST >> AHI 33/h, low sat 75% 07/17/2017 HST: AHI 23.5/hr, SaO2 low 77% 05/23/2021 LDCT: atherosclerosis. Mild centrilobular emphysema with diffuse bronchial wall thickening. Lung RADS 2 . Positive allergens to molds, cat, and dog dander.   Review of Systems neg for any significant sore throat, dysphagia, itching, sneezing, nasal congestion or excess/ purulent secretions, fever, chills, sweats, unintended wt loss, pleuritic or exertional cp, hempoptysis, orthopnea pnd or change in chronic leg swelling. Also denies presyncope, palpitations, heartburn, abdominal pain, nausea, vomiting, diarrhea or change in bowel or urinary habits, dysuria,hematuria, rash, arthralgias, visual complaints, headache, numbness weakness or ataxia.      Objective:   Physical Exam  Gen. Pleasant, obese, in no distress ENT - no lesions, no post nasal drip Neck: No JVD, no thyromegaly, no carotid bruits Lungs: no use of accessory muscles, no dullness to percussion, decreased without rales or rhonchi  Cardiovascular: Rhythm regular, heart sounds  normal, no murmurs or gallops, no peripheral edema Musculoskeletal: No deformities, no cyanosis or clubbing , no tremors       Assessment & Plan:

## 2022-02-27 DIAGNOSIS — M5431 Sciatica, right side: Secondary | ICD-10-CM | POA: Diagnosis not present

## 2022-02-27 DIAGNOSIS — M5031 Other cervical disc degeneration,  high cervical region: Secondary | ICD-10-CM | POA: Diagnosis not present

## 2022-02-27 DIAGNOSIS — M9901 Segmental and somatic dysfunction of cervical region: Secondary | ICD-10-CM | POA: Diagnosis not present

## 2022-02-27 DIAGNOSIS — M9903 Segmental and somatic dysfunction of lumbar region: Secondary | ICD-10-CM | POA: Diagnosis not present

## 2022-03-13 DIAGNOSIS — M5431 Sciatica, right side: Secondary | ICD-10-CM | POA: Diagnosis not present

## 2022-03-13 DIAGNOSIS — M9901 Segmental and somatic dysfunction of cervical region: Secondary | ICD-10-CM | POA: Diagnosis not present

## 2022-03-13 DIAGNOSIS — M5031 Other cervical disc degeneration,  high cervical region: Secondary | ICD-10-CM | POA: Diagnosis not present

## 2022-03-13 DIAGNOSIS — M9903 Segmental and somatic dysfunction of lumbar region: Secondary | ICD-10-CM | POA: Diagnosis not present

## 2022-03-27 DIAGNOSIS — M5431 Sciatica, right side: Secondary | ICD-10-CM | POA: Diagnosis not present

## 2022-03-27 DIAGNOSIS — M9903 Segmental and somatic dysfunction of lumbar region: Secondary | ICD-10-CM | POA: Diagnosis not present

## 2022-03-27 DIAGNOSIS — M5031 Other cervical disc degeneration,  high cervical region: Secondary | ICD-10-CM | POA: Diagnosis not present

## 2022-03-27 DIAGNOSIS — M9901 Segmental and somatic dysfunction of cervical region: Secondary | ICD-10-CM | POA: Diagnosis not present

## 2022-04-19 DIAGNOSIS — M9901 Segmental and somatic dysfunction of cervical region: Secondary | ICD-10-CM | POA: Diagnosis not present

## 2022-04-19 DIAGNOSIS — M5431 Sciatica, right side: Secondary | ICD-10-CM | POA: Diagnosis not present

## 2022-04-19 DIAGNOSIS — M9903 Segmental and somatic dysfunction of lumbar region: Secondary | ICD-10-CM | POA: Diagnosis not present

## 2022-04-19 DIAGNOSIS — M5031 Other cervical disc degeneration,  high cervical region: Secondary | ICD-10-CM | POA: Diagnosis not present

## 2022-05-02 DIAGNOSIS — M9903 Segmental and somatic dysfunction of lumbar region: Secondary | ICD-10-CM | POA: Diagnosis not present

## 2022-05-02 DIAGNOSIS — M9901 Segmental and somatic dysfunction of cervical region: Secondary | ICD-10-CM | POA: Diagnosis not present

## 2022-05-02 DIAGNOSIS — M5431 Sciatica, right side: Secondary | ICD-10-CM | POA: Diagnosis not present

## 2022-05-02 DIAGNOSIS — M5031 Other cervical disc degeneration,  high cervical region: Secondary | ICD-10-CM | POA: Diagnosis not present

## 2022-05-15 DIAGNOSIS — M9903 Segmental and somatic dysfunction of lumbar region: Secondary | ICD-10-CM | POA: Diagnosis not present

## 2022-05-15 DIAGNOSIS — M9901 Segmental and somatic dysfunction of cervical region: Secondary | ICD-10-CM | POA: Diagnosis not present

## 2022-05-15 DIAGNOSIS — M5031 Other cervical disc degeneration,  high cervical region: Secondary | ICD-10-CM | POA: Diagnosis not present

## 2022-05-15 DIAGNOSIS — M5431 Sciatica, right side: Secondary | ICD-10-CM | POA: Diagnosis not present

## 2022-05-25 ENCOUNTER — Ambulatory Visit (INDEPENDENT_AMBULATORY_CARE_PROVIDER_SITE_OTHER): Payer: Medicare Other

## 2022-05-25 DIAGNOSIS — Z87891 Personal history of nicotine dependence: Secondary | ICD-10-CM | POA: Diagnosis not present

## 2022-05-25 DIAGNOSIS — Z122 Encounter for screening for malignant neoplasm of respiratory organs: Secondary | ICD-10-CM

## 2022-05-29 ENCOUNTER — Other Ambulatory Visit: Payer: Self-pay | Admitting: Acute Care

## 2022-05-29 DIAGNOSIS — M5431 Sciatica, right side: Secondary | ICD-10-CM | POA: Diagnosis not present

## 2022-05-29 DIAGNOSIS — Z122 Encounter for screening for malignant neoplasm of respiratory organs: Secondary | ICD-10-CM

## 2022-05-29 DIAGNOSIS — M9903 Segmental and somatic dysfunction of lumbar region: Secondary | ICD-10-CM | POA: Diagnosis not present

## 2022-05-29 DIAGNOSIS — M9901 Segmental and somatic dysfunction of cervical region: Secondary | ICD-10-CM | POA: Diagnosis not present

## 2022-05-29 DIAGNOSIS — Z87891 Personal history of nicotine dependence: Secondary | ICD-10-CM

## 2022-05-29 DIAGNOSIS — M5031 Other cervical disc degeneration,  high cervical region: Secondary | ICD-10-CM | POA: Diagnosis not present

## 2022-06-19 DIAGNOSIS — M5031 Other cervical disc degeneration,  high cervical region: Secondary | ICD-10-CM | POA: Diagnosis not present

## 2022-06-19 DIAGNOSIS — M5431 Sciatica, right side: Secondary | ICD-10-CM | POA: Diagnosis not present

## 2022-06-19 DIAGNOSIS — M9901 Segmental and somatic dysfunction of cervical region: Secondary | ICD-10-CM | POA: Diagnosis not present

## 2022-06-19 DIAGNOSIS — M9903 Segmental and somatic dysfunction of lumbar region: Secondary | ICD-10-CM | POA: Diagnosis not present

## 2022-06-27 NOTE — Progress Notes (Unsigned)
Subjective:    Patient ID: [name], male    DOB: 15-Dec-1950, 72 y.o.   MRN: [id number]     HPI Zeke is here for follow up of his chronic medical problems.  Was referred to ENT by pulmonary, but never heard about the referral.  Still having chronic sinus issues and he would really like to have that addressed.  Not tolerating CPAP  Medications and allergies reviewed with patient and updated if appropriate.  Current Outpatient Medications on File Prior to Visit  Medication Sig Dispense Refill   allopurinol (ZYLOPRIM) 100 MG tablet Take 1 tablet by mouth twice daily 180 tablet 1   Ascorbic Acid (VITAMIN C WITH ROSE HIPS) 500 MG tablet Take 1,000 mg by mouth daily.     atorvastatin (LIPITOR) 10 MG tablet Take 1 tablet (10 mg total) by mouth daily. 90 tablet 3   Azelastine HCl 0.15 % SOLN Place 1-2 sprays into both nostrils 2 (two) times daily as needed. 30 mL 5   B Complex-C (SUPER B COMPLEX PO) Take 1 tablet by mouth daily.     Calcium Carbonate-Vitamin D (CALCIUM-VITAMIN D3 PO) Take 1 tablet by mouth every other day.     Cholecalciferol (VITAMIN D3) 1000 units CAPS Take 1,000 Units by mouth daily.     Garlic 1000 MG CAPS Take 1,000 mg by mouth every other day.     glucosamine-chondroitin 500-400 MG tablet Take 2 tablets by mouth daily.     levocetirizine (XYZAL) 5 MG tablet Take 10 mg by mouth every evening.     levothyroxine (SYNTHROID) 112 MCG tablet Take 1 tablet (112 mcg total) by mouth daily. 90 tablet 3   losartan-hydrochlorothiazide (HYZAAR) 100-12.5 MG tablet Take 1 tablet by mouth daily after breakfast. 90 tablet 3   Magnesium 100 MG TABS Take 100 mg by mouth every other day.     meloxicam (MOBIC) 15 MG tablet Take 1 tablet (15 mg total) by mouth daily as needed for pain. Take with food 90 tablet 1   metFORMIN (GLUCOPHAGE-XR) 500 MG 24 hr tablet Take 2 tablets (1,000 mg total) by mouth 2 (two) times daily with a meal. 360 tablet 3   montelukast (SINGULAIR) 10  MG tablet Take 1 tablet (10 mg total) by mouth at bedtime. 30 tablet 5   Multiple Vitamins-Minerals (MULTIVITAMIN ADULTS 50+ PO) Take 1 tablet by mouth daily.     pantoprazole (PROTONIX) 40 MG tablet Take 1 tablet (40 mg total) by mouth daily. 90 tablet 3   vitamin E 400 UNIT capsule Take 400 Units by mouth every other day.     Zinc 50 MG TABS Take by mouth.     [DISCONTINUED] Azelastine-Fluticasone 137-50 MCG/ACT SUSP Place 1 spray into the nose 2 (two) times daily as needed. (Patient not taking: Reported on 09/16/2019) 23 g 2   [DISCONTINUED] fluticasone (FLONASE) 50 MCG/ACT nasal spray Place 2 sprays into both nostrils daily. (Patient not taking: Reported on 09/16/2019) 1 g 5   No current facility-administered medications on file prior to visit.     Review of Systems  Constitutional:  Negative for fever.  HENT:  Positive for congestion (some green mucus), sinus pressure and sinus pain.   Respiratory:  Positive for shortness of breath (if overexerts himself). Negative for cough and wheezing (only if in dusty area).   Cardiovascular:  Negative for chest pain, palpitations and leg swelling.  Neurological:  Positive for dizziness, light-headedness and headaches.  Objective:   Vitals:   06/28/22 1400 06/28/22 1412  BP: (!) 148/72 130/72  Pulse: 73   Temp: 98.2 F (36.8 C)   SpO2: 94%    BP Readings from Last 3 Encounters:  06/28/22 130/72  02/22/22 (!) 140/82  12/27/21 130/80   Wt Readings from Last 3 Encounters:  06/28/22 195 lb 6 oz (88.6 kg)  05/25/22 202 lb (91.6 kg)  02/22/22 202 lb 9.6 oz (91.9 kg)   Body mass index is 28.85 kg/m.    Physical Exam Constitutional:      General: He is not in acute distress.    Appearance: Normal appearance. He is not ill-appearing.  HENT:     Head: Normocephalic and atraumatic.  Eyes:     Conjunctiva/sclera: Conjunctivae normal.  Cardiovascular:     Rate and Rhythm: Normal rate and regular rhythm.     Heart sounds: Normal  heart sounds.  Pulmonary:     Effort: Pulmonary effort is normal. No respiratory distress.     Breath sounds: Normal breath sounds. No wheezing or rales.  Musculoskeletal:     Right lower leg: No edema.     Left lower leg: No edema.  Skin:    General: Skin is warm and dry.     Findings: No rash.  Neurological:     Mental Status: He is alert. Mental status is at baseline.  Psychiatric:        Mood and Affect: Mood normal.        Lab Results  Component Value Date   WBC 4.8 12/27/2021   HGB 14.2 12/27/2021   HCT 43.1 12/27/2021   PLT 266.0 12/27/2021   GLUCOSE 124 (H) 12/27/2021   CHOL 113 12/27/2021   TRIG 99.0 12/27/2021   HDL 42.20 12/27/2021   LDLCALC 51 12/27/2021   ALT 19 12/27/2021   AST 23 12/27/2021   NA 137 12/27/2021   K 4.8 12/27/2021   CL 101 12/27/2021   CREATININE 1.25 12/27/2021   BUN 23 12/27/2021   CO2 28 12/27/2021   TSH 7.21 (H) 12/27/2021   PSA 0.29 12/27/2020   INR 1.0 01/30/2017   HGBA1C 7.2 (H) 12/27/2021   MICROALBUR 13.1 (H) 06/27/2021     Assessment & Plan:    See Problem List for Assessment and Plan of chronic medical problems.

## 2022-06-27 NOTE — Patient Instructions (Addendum)
      Blood work was ordered.   The lab is on the first floor.    Medications changes include :   none    A referral was ordered for ENT and someone will call you to schedule an appointment.     Return in about 6 months (around 12/28/2022) for follow up.

## 2022-06-28 ENCOUNTER — Encounter: Payer: Self-pay | Admitting: Internal Medicine

## 2022-06-28 ENCOUNTER — Ambulatory Visit (INDEPENDENT_AMBULATORY_CARE_PROVIDER_SITE_OTHER): Payer: Medicare Other | Admitting: Internal Medicine

## 2022-06-28 ENCOUNTER — Ambulatory Visit: Payer: Medicare Other | Admitting: Internal Medicine

## 2022-06-28 VITALS — BP 130/72 | HR 73 | Temp 98.2°F | Ht 69.0 in | Wt 195.4 lb

## 2022-06-28 DIAGNOSIS — Z125 Encounter for screening for malignant neoplasm of prostate: Secondary | ICD-10-CM

## 2022-06-28 DIAGNOSIS — I700 Atherosclerosis of aorta: Secondary | ICD-10-CM

## 2022-06-28 DIAGNOSIS — E118 Type 2 diabetes mellitus with unspecified complications: Secondary | ICD-10-CM

## 2022-06-28 DIAGNOSIS — Z7984 Long term (current) use of oral hypoglycemic drugs: Secondary | ICD-10-CM

## 2022-06-28 DIAGNOSIS — I10 Essential (primary) hypertension: Secondary | ICD-10-CM | POA: Diagnosis not present

## 2022-06-28 DIAGNOSIS — K219 Gastro-esophageal reflux disease without esophagitis: Secondary | ICD-10-CM | POA: Diagnosis not present

## 2022-06-28 DIAGNOSIS — E039 Hypothyroidism, unspecified: Secondary | ICD-10-CM | POA: Diagnosis not present

## 2022-06-28 DIAGNOSIS — J329 Chronic sinusitis, unspecified: Secondary | ICD-10-CM | POA: Diagnosis not present

## 2022-06-28 DIAGNOSIS — M109 Gout, unspecified: Secondary | ICD-10-CM

## 2022-06-28 DIAGNOSIS — M159 Polyosteoarthritis, unspecified: Secondary | ICD-10-CM | POA: Diagnosis not present

## 2022-06-28 DIAGNOSIS — E782 Mixed hyperlipidemia: Secondary | ICD-10-CM

## 2022-06-28 LAB — COMPREHENSIVE METABOLIC PANEL
ALT: 15 U/L (ref 0–53)
AST: 22 U/L (ref 0–37)
Albumin: 3.9 g/dL (ref 3.5–5.2)
Alkaline Phosphatase: 66 U/L (ref 39–117)
BUN: 24 mg/dL — ABNORMAL HIGH (ref 6–23)
CO2: 29 mEq/L (ref 19–32)
Calcium: 9.4 mg/dL (ref 8.4–10.5)
Chloride: 100 mEq/L (ref 96–112)
Creatinine, Ser: 1.52 mg/dL — ABNORMAL HIGH (ref 0.40–1.50)
GFR: 46.43 mL/min — ABNORMAL LOW (ref >60.00)
Glucose, Bld: 117 mg/dL — ABNORMAL HIGH (ref 70–99)
Potassium: 4.4 mEq/L (ref 3.5–5.1)
Sodium: 138 mEq/L (ref 135–145)
Total Bilirubin: 0.3 mg/dL (ref 0.2–1.2)
Total Protein: 6.4 g/dL (ref 6.0–8.3)

## 2022-06-28 LAB — LIPID PANEL
Cholesterol: 111 mg/dL (ref 0–200)
HDL: 36.5 mg/dL — ABNORMAL LOW (ref >39.00)
LDL Cholesterol: 44 mg/dL (ref 0–99)
NonHDL: 74.16
Total CHOL/HDL Ratio: 3
Triglycerides: 149 mg/dL (ref 0.0–149.0)
VLDL: 29.8 mg/dL (ref 0.0–40.0)

## 2022-06-28 LAB — MICROALBUMIN / CREATININE URINE RATIO
Creatinine,U: 75.4 mg/dL
Microalb Creat Ratio: 31.1 mg/g — ABNORMAL HIGH (ref 0.0–30.0)
Microalb, Ur: 23.4 mg/dL — ABNORMAL HIGH (ref 0.0–1.9)

## 2022-06-28 LAB — URIC ACID: Uric Acid, Serum: 5.4 mg/dL (ref 4.0–7.8)

## 2022-06-28 LAB — TSH: TSH: 2.65 u[IU]/mL (ref 0.35–5.50)

## 2022-06-28 LAB — PSA, MEDICARE: PSA: 0.34 ng/ml (ref 0.10–4.00)

## 2022-06-28 LAB — HEMOGLOBIN A1C: Hgb A1c MFr Bld: 6.7 % — ABNORMAL HIGH (ref 4.6–6.5)

## 2022-06-28 NOTE — Assessment & Plan Note (Signed)
Chronic GERD controlled Continue pantoprazole 40 mg daily 

## 2022-06-28 NOTE — Assessment & Plan Note (Signed)
Chronic Continue atorvastatin 10 mg daily  LDL at goal Healthy diet and increased activity encouraged  Lab Results  Component Value Date   LDLCALC 51 12/27/2021

## 2022-06-28 NOTE — Assessment & Plan Note (Signed)
Chronic Regular exercise and healthy diet encouraged Check lipid panel  Continue atorvastatin 10 mg daily 

## 2022-06-28 NOTE — Assessment & Plan Note (Signed)
Chronic Controlled, Stable Continue allopurinol 100 mg twice daily Check uric acid level

## 2022-06-28 NOTE — Assessment & Plan Note (Signed)
Chronic °Blood pressure well controlled °CMP °Continue losartan-HCTZ 100-12.5 mg daily °

## 2022-06-28 NOTE — Assessment & Plan Note (Signed)
Chronic  Clinically euthyroid Check tsh and will titrate med dose if needed Currently taking levothyroxine 112 mcg daily 

## 2022-06-28 NOTE — Assessment & Plan Note (Signed)
Chronic   Lab Results  Component Value Date   HGBA1C 7.2 (H) 12/27/2021   Sugars not ideally controlled Testing sugars 1 times a day Check A1c Continue metformin XR 1000 mg bid Stressed regular exercise, diabetic diet

## 2022-06-28 NOTE — Assessment & Plan Note (Signed)
Chronic  Frequent infections Has mild symptoms now of a possible infection but mild - hold of on abx Continue allergy regimen

## 2022-06-28 NOTE — Assessment & Plan Note (Signed)
Chronic Continue meloxicam 15 mg daily as needed-discussed avoiding regular use because of possible side effects 

## 2022-07-03 DIAGNOSIS — M9901 Segmental and somatic dysfunction of cervical region: Secondary | ICD-10-CM | POA: Diagnosis not present

## 2022-07-03 DIAGNOSIS — M5031 Other cervical disc degeneration,  high cervical region: Secondary | ICD-10-CM | POA: Diagnosis not present

## 2022-07-03 DIAGNOSIS — M5431 Sciatica, right side: Secondary | ICD-10-CM | POA: Diagnosis not present

## 2022-07-03 DIAGNOSIS — M9903 Segmental and somatic dysfunction of lumbar region: Secondary | ICD-10-CM | POA: Diagnosis not present

## 2022-07-06 ENCOUNTER — Other Ambulatory Visit: Payer: Self-pay | Admitting: Internal Medicine

## 2022-07-06 ENCOUNTER — Ambulatory Visit (INDEPENDENT_AMBULATORY_CARE_PROVIDER_SITE_OTHER): Payer: Medicare Other

## 2022-07-06 VITALS — Ht 69.0 in | Wt 195.0 lb

## 2022-07-06 DIAGNOSIS — Z0000 Encounter for general adult medical examination without abnormal findings: Secondary | ICD-10-CM | POA: Diagnosis not present

## 2022-07-06 NOTE — Progress Notes (Signed)
Subjective:   [name] is a 72 y.o. male who presents for Medicare Annual/Subsequent preventive examination.  Visit Complete: Virtual  I connected with  Lindi Adie on 07/06/22 by a audio enabled telemedicine application and verified that I am speaking with the correct person using two identifiers.  Patient Location: Home  Provider Location: Office/Clinic  I discussed the limitations of evaluation and management by telemedicine. The patient expressed understanding and agreed to proceed.  Review of Systems     Cardiac Risk Factors include: advanced age (>33men, >20 women);diabetes mellitus;dyslipidemia;hypertension;male gender;family history of premature cardiovascular disease     Objective:    Today's Vitals   07/06/22 0946  Weight: 195 lb (88.5 kg)  Height: 5\' 9"  (1.753 m)  PainSc: 0-No pain   Body mass index is 28.8 kg/m.     07/06/2022    9:48 AM 07/04/2021    1:19 PM 06/24/2019    8:32 AM 04/05/2016    5:46 PM 04/05/2016   11:15 AM 03/24/2016    3:00 PM  Advanced Directives  Does Patient Have a Medical Advance Directive? No No Yes Yes Yes No  Type of Aeronautical engineer of [place];Living will Healthcare Power of [place];Living will   Does patient want to make changes to medical advance directive?   No - Patient declined No - Patient declined    Copy of Healthcare Power of Attorney in Chart?    No - copy requested Yes   Would patient like information on creating a medical advance directive? No - Patient declined No - Patient declined  No - Patient declined Yes (MAU/Ambulatory/Procedural Areas - Information given) Yes (MAU/Ambulatory/Procedural Areas - Information given)    Current Medications (verified) Outpatient Encounter Medications as of 07/06/2022  Medication Sig   allopurinol (ZYLOPRIM) 100 MG tablet Take 1 tablet by mouth twice daily   Ascorbic Acid (VITAMIN C WITH ROSE HIPS) 500 MG tablet Take 1,000 mg by mouth daily.    atorvastatin (LIPITOR) 10 MG tablet Take 1 tablet (10 mg total) by mouth daily.   Azelastine HCl 0.15 % SOLN Place 1-2 sprays into both nostrils 2 (two) times daily as needed.   B Complex-C (SUPER B COMPLEX PO) Take 1 tablet by mouth daily.   Calcium Carbonate-Vitamin D (CALCIUM-VITAMIN D3 PO) Take 1 tablet by mouth every other day.   Cholecalciferol (VITAMIN D3) 1000 units CAPS Take 1,000 Units by mouth daily.   Garlic 1000 MG CAPS Take 1,000 mg by mouth every other day.   glucosamine-chondroitin 500-400 MG tablet Take 2 tablets by mouth daily.   levocetirizine (XYZAL) 5 MG tablet Take 10 mg by mouth every evening.   levothyroxine (SYNTHROID) 112 MCG tablet Take 1 tablet (112 mcg total) by mouth daily.   losartan-hydrochlorothiazide (HYZAAR) 100-12.5 MG tablet Take 1 tablet by mouth daily after breakfast.   Magnesium 100 MG TABS Take 100 mg by mouth every other day.   meloxicam (MOBIC) 15 MG tablet Take 1 tablet (15 mg total) by mouth daily as needed for pain. Take with food   metFORMIN (GLUCOPHAGE-XR) 500 MG 24 hr tablet Take 2 tablets (1,000 mg total) by mouth 2 (two) times daily with a meal.   montelukast (SINGULAIR) 10 MG tablet Take 1 tablet (10 mg total) by mouth at bedtime.   Multiple Vitamins-Minerals (MULTIVITAMIN ADULTS 50+ PO) Take 1 tablet by mouth daily.   pantoprazole (PROTONIX) 40 MG tablet Take 1 tablet (40 mg total) by mouth daily.  vitamin E 400 UNIT capsule Take 400 Units by mouth every other day.   Zinc 50 MG TABS Take by mouth.   [DISCONTINUED] Azelastine-Fluticasone 137-50 MCG/ACT SUSP Place 1 spray into the nose 2 (two) times daily as needed. (Patient not taking: Reported on 09/16/2019)   [DISCONTINUED] fluticasone (FLONASE) 50 MCG/ACT nasal spray Place 2 sprays into both nostrils daily. (Patient not taking: Reported on 09/16/2019)   No facility-administered encounter medications on file as of 07/06/2022.    Allergies (verified) Morphine   History: Past Medical  History:  Diagnosis Date   Allergy    Arthritis    Diabetes mellitus without complication (HCC)    Diverticulitis of colon    GERD (gastroesophageal reflux disease)    Glaucoma    Gout    Headache    Hypertension    Hypothyroidism    Thyroid disease    Past Surgical History:  Procedure Laterality Date   ANKLE FRACTURE SURGERY     ANTERIOR CERVICAL DECOMP/DISCECTOMY FUSION N/A 04/05/2016   Procedure: C5-6, C6-7 Anterior Cervical Discectomy and Fusion, Allograft, Plate;  Surgeon: Eldred Manges, MD;  Location: [facility] OR;  Service: Orthopedics;  Laterality: N/A;   ANTERIOR FUSION CERVICAL SPINE  04/05/2016   c6 c7   CARPAL TUNNEL RELEASE Bilateral    JOINT REPLACEMENT  01/07   left knee replaced    JOINT REPLACEMENT  11/10   right knee replaced    KNEE SURGERY     LEFT HEART CATH AND CORONARY ANGIOGRAPHY N/A 02/06/2017   Procedure: LEFT HEART CATH AND CORONARY ANGIOGRAPHY;  Surgeon: Lyn Records, MD;  Location: [facility] INVASIVE [facility];  Service: Cardiovascular;  Laterality: N/A;   LEG SURGERY  11/03   broken lower right leg crushed    Family History  Problem Relation Age of Onset   Hypertension Mother    Diabetes Mother    Heart attack Father    Heart disease Father    Diabetes Paternal Grandmother    Cancer Paternal Grandfather        of eye   Colon cancer Neg Hx    Stomach cancer Neg Hx    Esophageal cancer Neg Hx    Social History   Socioeconomic History   Marital status: Divorced    Spouse name: Not on file   Number of children: Not on file   Years of education: Not on file   Highest education level: Not on file  Occupational History   Not on file  Tobacco Use   Smoking status: Former    Types: Cigarettes    Quit date: 01/10/2008    Years since quitting: 14.4   Smokeless tobacco: Former    Types: Associate Professor Use: Never used  Substance and Sexual Activity   Alcohol use: Yes    Comment: occasionally   Drug use: No   Sexual activity: Yes    Birth  control/protection: None    Comment: Married  Other Topics Concern   Not on file  Social History Narrative   Not on file   Social Determinants of Health   Financial Resource Strain: Low Risk  (07/06/2022)   Overall Financial Resource Strain (CARDIA)    Difficulty of Paying Living Expenses: Not hard at all  Food Insecurity: No Food Insecurity (07/06/2022)   Hunger Vital Sign    Worried About Running Out of Food in the Last Year: Never true    Ran Out of Food in the Last Year:  Never true  Transportation Needs: No Transportation Needs (07/06/2022)   PRAPARE - Administrator, Civil Service (Medical): No    Lack of Transportation (Non-Medical): No  Physical Activity: Sufficiently Active (07/06/2022)   Exercise Vital Sign    Days of Exercise per Week: 7 days    Minutes of Exercise per Session: 30 min  Stress: No Stress Concern Present (07/06/2022)   [employer] of Occupational Health - Occupational Stress Questionnaire    Feeling of Stress : Not at all  Social Connections: Moderately Integrated (07/06/2022)   Social Connection and Isolation Panel [NHANES]    Frequency of Communication with Friends and Family: Twice a week    Frequency of Social Gatherings with Friends and Family: Twice a week    Attends Religious Services: More than 4 times per year    Active Member of [employer] or Organizations: Yes    Attends Engineer, structural: More than 4 times per year    Marital Status: Divorced    Tobacco Counseling Counseling given: Not Answered   Clinical Intake:  Pre-visit preparation completed: Yes  Pain : No/denies pain Pain Score: 0-No pain     BMI - recorded: 28.8 Nutritional Status: BMI 25 -29 Overweight Nutritional Risks: None Diabetes: Yes CBG done?: No Did pt. bring in CBG monitor from home?: No  How often do you need to have someone help you when you read instructions, pamphlets, or other written materials from your doctor or pharmacy?: 1 -  Never What is the last grade level you completed in school?: HSG  Interpreter Needed?: No  Information entered by :: Aily Tzeng N. Keisa Blow, LPN.   Activities of Daily Living    07/06/2022    9:51 AM  In your present state of health, do you have any difficulty performing the following activities:  Hearing? 0  Vision? 0  Difficulty concentrating or making decisions? 0  Walking or climbing stairs? 0  Dressing or bathing? 0  Doing errands, shopping? 0  Preparing Food and eating ? N  Using the Toilet? N  In the past six months, have you accidently leaked urine? N  Do you have problems with loss of bowel control? N  Managing your Medications? N  Managing your Finances? N  Housekeeping or managing your Housekeeping? N    Patient Care Team: Pincus Sanes, MD as PCP - General (Internal Medicine) Lyn Records, MD (Inactive) as PCP - Cardiology (Cardiology) Russella Dar, OD as Consulting Physician (Optometry)  Indicate any recent Medical Services you may have received from other than [facility] providers in the past year (date may be approximate).     Assessment:   This is a routine wellness examination for [name].  Hearing/Vision screen Hearing Screening - Comments:: Denies hearing difficulties   Vision Screening - Comments:: Wears rx glasses since age 49 - up to date with routine eye exams with Lelon Mast Dunnington, OD.  Scheduled for 07/11/2022.   Dietary issues and exercise activities discussed:     Goals Addressed             This Visit's Progress    My healthcare goal for 2024 is to stay alive and maintain my health.        Depression Screen    07/06/2022   10:01 AM 06/28/2022    2:02 PM 12/27/2021    8:16 AM 07/04/2021    1:20 PM 07/04/2021    1:11 PM 06/27/2021    7:55 AM 03/14/2021   10:27  AM  PHQ 2/9 Scores  PHQ - 2 Score 0 0 0 0 0 0 0  PHQ- 9 Score 0  0   0     Fall Risk    07/06/2022    9:49 AM 06/28/2022    2:01 PM 12/27/2021    8:15 AM 07/04/2021     1:21 PM 06/27/2021    7:55 AM  Fall Risk   Falls in the past year? 0 0 0 0 0  Number falls in past yr: 0 0 0 0   Injury with Fall? 0 0 0 0   Risk for fall due to : No Fall Risks No Fall Risks No Fall Risks    Follow up Falls prevention discussed Falls evaluation completed Falls evaluation completed Falls evaluation completed;Education provided     MEDICARE RISK AT HOME:  Medicare Risk at Home - 07/06/22 0949     Any stairs in or around the home? No    If so, are there any without handrails? No    Home free of loose throw rugs in walkways, pet beds, electrical cords, etc? Yes    Adequate lighting in your home to reduce risk of falls? Yes    Life alert? No    Use of a cane, walker or w/c? No    Grab bars in the bathroom? Yes    Shower chair or bench in shower? Yes    Elevated toilet seat or a handicapped toilet? No             TIMED UP AND GO:  Was the test performed?  No    Cognitive Function:        07/06/2022    9:53 AM  6CIT Screen  What Year? 0 points  What month? 0 points  What time? 0 points  Count back from 20 0 points  Months in reverse 0 points  Repeat phrase 0 points  Total Score 0 points    Immunizations Immunization History  Administered Date(s) Administered   Fluad Quad(high Dose 65+) 10/28/2021   Influenza, High Dose Seasonal PF 10/12/2017, 11/06/2019   Influenza,inj,Quad PF,6+ Mos 10/11/2018   Influenza-Unspecified 11/16/2020   PFIZER(Purple Top)SARS-COV-2 Vaccination 05/06/2019, 05/26/2019, 12/25/2019   Pneumococcal Conjugate-13 12/24/2018   Pneumococcal Polysaccharide-23 11/12/2017   Tdap 06/27/2017   Zoster Recombinat (Shingrix) 12/27/2017, 03/28/2018    TDAP status: Up to date  Flu Vaccine status: Up to date  Pneumococcal vaccine status: Up to date  Covid-19 vaccine status: Completed vaccines  Qualifies for Shingles Vaccine? Yes   Zostavax completed No   Shingrix Completed?: Yes  Screening Tests Health Maintenance  Topic  Date Due   FOOT EXAM  06/21/2021   COVID-19 Vaccine (4 - 2023-24 season) 09/09/2021   OPHTHALMOLOGY EXAM  07/19/2022   INFLUENZA VACCINE  08/10/2022   HEMOGLOBIN A1C  12/28/2022   Diabetic kidney evaluation - eGFR measurement  06/28/2023   Diabetic kidney evaluation - Urine ACR  06/28/2023   Medicare Annual Wellness (AWV)  07/06/2023   DTaP/Tdap/Td (2 - Td or Tdap) 06/28/2027   Colonoscopy  10/21/2028   Pneumonia Vaccine 12+ Years old  Completed   Hepatitis C Screening  Completed   Zoster Vaccines- Shingrix  Completed   HPV VACCINES  Aged Out    Health Maintenance  Health Maintenance Due  Topic Date Due   FOOT EXAM  06/21/2021   COVID-19 Vaccine (4 - 2023-24 season) 09/09/2021    Colorectal cancer screening: Type of screening: Colonoscopy. Completed 10/21/2021. Repeat  every 7 years  Lung Cancer Screening: (Low Dose CT Chest recommended if Age 24-80 years, 20 pack-year currently smoking OR have quit w/in 15years.) does not qualify.   Lung Cancer Screening Referral: no  Additional Screening:  Hepatitis C Screening: does qualify; Completed 12/27/2016  Vision Screening: Recommended annual ophthalmology exams for early detection of glaucoma and other disorders of the eye. Is the patient up to date with their annual eye exam?  Yes  Who is the provider or what is the name of the office in which the patient attends annual eye exams? Samantha Dunnington, OD. If pt is not established with a provider, would they like to be referred to a provider to establish care? No .   Dental Screening: Recommended annual dental exams for proper oral hygiene  Diabetic Foot Exam: Diabetic Foot Exam: Overdue, Pt has been advised about the importance in completing this exam. Pt is scheduled for diabetic foot exam on 12/28/2022.  Community Resource Referral / Chronic Care Management: CRR required this visit?  No   CCM required this visit?  No     Plan:     I have personally reviewed and noted  the following in the patient's chart:   Medical and social history Use of alcohol, tobacco or illicit drugs  Current medications and supplements including opioid prescriptions. Patient is not currently taking opioid prescriptions. Functional ability and status Nutritional status Physical activity Advanced directives List of other physicians Hospitalizations, surgeries, and ER visits in previous 12 months Vitals Screenings to include cognitive, depression, and falls Referrals and appointments  In addition, I have reviewed and discussed with patient certain preventive protocols, quality metrics, and best practice recommendations. A written personalized care plan for preventive services as well as general preventive health recommendations were provided to patient.     Mickeal Needy, LPN   5/36/6440   After Visit Summary: (Mail) Due to this being a telephonic visit, the after visit summary with patients personalized plan was offered to patient via mail   Nurse Notes: Normal cognitive status assessed by direct observation via telephone conversation by this Nurse Health Advisor. No abnormalities found.

## 2022-07-06 NOTE — Patient Instructions (Addendum)
[name] , Thank you for taking time to come for your Medicare Wellness Visit. I appreciate your ongoing commitment to your health goals. Please review the following plan we discussed and let me know if I can assist you in the future.   These are the goals we discussed:  Goals      My healthcare goal for 2024 is to stay alive and maintain my health.        This is a list of the screening recommended for you and due dates:  Health Maintenance  Topic Date Due   Complete foot exam   06/21/2021   COVID-19 Vaccine (4 - 2023-24 season) 09/09/2021   Eye exam for diabetics  07/19/2022   Flu Shot  08/10/2022   Hemoglobin A1C  12/28/2022   Yearly kidney function blood test for diabetes  06/28/2023   Yearly kidney health urinalysis for diabetes  06/28/2023   Medicare Annual Wellness Visit  07/06/2023   DTaP/Tdap/Td vaccine (2 - Td or Tdap) 06/28/2027   Colon Cancer Screening  10/21/2028   Pneumonia Vaccine  Completed   Hepatitis C Screening  Completed   Zoster (Shingles) Vaccine  Completed   HPV Vaccine  Aged Out    Advanced directives: No; Daughter is aware of patient's wishes.  Conditions/risks identified: Yes; Type II Diabetes  Next appointment: It was nice speaking with you today!  Please follow up in one year for your annual wellness visit via telephone call with [name] on 07/12/2023 at 9:15 a.m.  If you need to cancel or reschedule please call [phone].  Preventive Care 32 Years and Older, Male  Preventive care refers to lifestyle choices and visits with your health care provider that can promote health and wellness. What does preventive care include? A yearly physical exam. This is also called an annual well check. Dental exams once or twice a year. Routine eye exams. Ask your health care provider how often you should have your eyes checked. Personal lifestyle choices, including: Daily care of your teeth and gums. Regular physical activity. Eating a healthy  diet. Avoiding tobacco and drug use. Limiting alcohol use. Practicing safe sex. Taking low doses of aspirin every day. Taking vitamin and mineral supplements as recommended by your health care provider. What happens during an annual well check? The services and screenings done by your health care provider during your annual well check will depend on your age, overall health, lifestyle risk factors, and family history of disease. Counseling  Your health care provider may ask you questions about your: Alcohol use. Tobacco use. Drug use. Emotional well-being. Home and relationship well-being. Sexual activity. Eating habits. History of falls. Memory and ability to understand (cognition). Work and work Astronomer. Screening  You may have the following tests or measurements: Height, weight, and BMI. Blood pressure. Lipid and cholesterol levels. These may be checked every 5 years, or more frequently if you are over 47 years old. Skin check. Lung cancer screening. You may have this screening every year starting at age 18 if you have a 30-pack-year history of smoking and currently smoke or have quit within the past 15 years. Fecal occult blood test (FOBT) of the stool. You may have this test every year starting at age 32. Flexible sigmoidoscopy or colonoscopy. You may have a sigmoidoscopy every 5 years or a colonoscopy every 10 years starting at age 31. Prostate cancer screening. Recommendations will vary depending on your family history and other risks. Hepatitis C blood test. Hepatitis B  blood test. Sexually transmitted disease (STD) testing. Diabetes screening. This is done by checking your blood sugar (glucose) after you have not eaten for a while (fasting). You may have this done every 1-3 years. Abdominal aortic aneurysm (AAA) screening. You may need this if you are a current or former smoker. Osteoporosis. You may be screened starting at age 46 if you are at high risk. Talk with  your health care provider about your test results, treatment options, and if necessary, the need for more tests. Vaccines  Your health care provider may recommend certain vaccines, such as: Influenza vaccine. This is recommended every year. Tetanus, diphtheria, and acellular pertussis (Tdap, Td) vaccine. You may need a Td booster every 10 years. Zoster vaccine. You may need this after age 5. Pneumococcal 13-valent conjugate (PCV13) vaccine. One dose is recommended after age 9. Pneumococcal polysaccharide (PPSV23) vaccine. One dose is recommended after age 63. Talk to your health care provider about which screenings and vaccines you need and how often you need them. This information is not intended to replace advice given to you by your health care provider. Make sure you discuss any questions you have with your health care provider. Document Released: 01/22/2015 Document Revised: 09/15/2015 Document Reviewed: 10/27/2014 Elsevier Interactive Patient Education  2017 [place] Prevention in the Home Falls can cause injuries. They can happen to people of all ages. There are many things you can do to make your home safe and to help prevent falls. What can I do on the outside of my home? Regularly fix the edges of walkways and driveways and fix any cracks. Remove anything that might make you trip as you walk through a door, such as a raised step or threshold. Trim any bushes or trees on the path to your home. Use bright outdoor lighting. Clear any walking paths of anything that might make someone trip, such as rocks or tools. Regularly check to see if handrails are loose or broken. Make sure that both sides of any steps have handrails. Any raised decks and porches should have guardrails on the edges. Have any leaves, snow, or ice cleared regularly. Use sand or salt on walking paths during winter. Clean up any spills in your garage right away. This includes oil or grease spills. What  can I do in the bathroom? Use night lights. Install grab bars by the toilet and in the tub and shower. Do not use towel bars as grab bars. Use non-skid mats or decals in the tub or shower. If you need to sit down in the shower, use a plastic, non-slip stool. Keep the floor dry. Clean up any water that spills on the floor as soon as it happens. Remove soap buildup in the tub or shower regularly. Attach bath mats securely with double-sided non-slip rug tape. Do not have throw rugs and other things on the floor that can make you trip. What can I do in the bedroom? Use night lights. Make sure that you have a light by your bed that is easy to reach. Do not use any sheets or blankets that are too big for your bed. They should not hang down onto the floor. Have a firm chair that has side arms. You can use this for support while you get dressed. Do not have throw rugs and other things on the floor that can make you trip. What can I do in the kitchen? Clean up any spills right away. Avoid walking on wet floors. Keep items  that you use a lot in easy-to-reach places. If you need to reach something above you, use a strong step stool that has a grab bar. Keep electrical cords out of the way. Do not use floor polish or wax that makes floors slippery. If you must use wax, use non-skid floor wax. Do not have throw rugs and other things on the floor that can make you trip. What can I do with my stairs? Do not leave any items on the stairs. Make sure that there are handrails on both sides of the stairs and use them. Fix handrails that are broken or loose. Make sure that handrails are as long as the stairways. Check any carpeting to make sure that it is firmly attached to the stairs. Fix any carpet that is loose or worn. Avoid having throw rugs at the top or bottom of the stairs. If you do have throw rugs, attach them to the floor with carpet tape. Make sure that you have a light switch at the top of the  stairs and the bottom of the stairs. If you do not have them, ask someone to add them for you. What else can I do to help prevent falls? Wear shoes that: Do not have high heels. Have rubber bottoms. Are comfortable and fit you well. Are closed at the toe. Do not wear sandals. If you use a stepladder: Make sure that it is fully opened. Do not climb a closed stepladder. Make sure that both sides of the stepladder are locked into place. Ask someone to hold it for you, if possible. Clearly mark and make sure that you can see: Any grab bars or handrails. First and last steps. Where the edge of each step is. Use tools that help you move around (mobility aids) if they are needed. These include: Canes. Walkers. Scooters. Crutches. Turn on the lights when you go into a dark area. Replace any light bulbs as soon as they burn out. Set up your furniture so you have a clear path. Avoid moving your furniture around. If any of your floors are uneven, fix them. If there are any pets around you, be aware of where they are. Review your medicines with your doctor. Some medicines can make you feel dizzy. This can increase your chance of falling. Ask your doctor what other things that you can do to help prevent falls. This information is not intended to replace advice given to you by your health care provider. Make sure you discuss any questions you have with your health care provider. Document Released: 10/22/2008 Document Revised: 06/03/2015 Document Reviewed: 01/30/2014 Elsevier Interactive Patient Education  2017 [employer].

## 2022-07-11 DIAGNOSIS — H43813 Vitreous degeneration, bilateral: Secondary | ICD-10-CM | POA: Diagnosis not present

## 2022-07-11 DIAGNOSIS — H5213 Myopia, bilateral: Secondary | ICD-10-CM | POA: Diagnosis not present

## 2022-07-11 DIAGNOSIS — H524 Presbyopia: Secondary | ICD-10-CM | POA: Diagnosis not present

## 2022-07-11 DIAGNOSIS — H401134 Primary open-angle glaucoma, bilateral, indeterminate stage: Secondary | ICD-10-CM | POA: Diagnosis not present

## 2022-07-11 DIAGNOSIS — H52223 Regular astigmatism, bilateral: Secondary | ICD-10-CM | POA: Diagnosis not present

## 2022-07-17 DIAGNOSIS — M5431 Sciatica, right side: Secondary | ICD-10-CM | POA: Diagnosis not present

## 2022-07-17 DIAGNOSIS — M9903 Segmental and somatic dysfunction of lumbar region: Secondary | ICD-10-CM | POA: Diagnosis not present

## 2022-07-17 DIAGNOSIS — M5031 Other cervical disc degeneration,  high cervical region: Secondary | ICD-10-CM | POA: Diagnosis not present

## 2022-07-17 DIAGNOSIS — M9901 Segmental and somatic dysfunction of cervical region: Secondary | ICD-10-CM | POA: Diagnosis not present

## 2022-07-24 ENCOUNTER — Encounter: Payer: Self-pay | Admitting: Internal Medicine

## 2022-07-31 DIAGNOSIS — M5031 Other cervical disc degeneration,  high cervical region: Secondary | ICD-10-CM | POA: Diagnosis not present

## 2022-07-31 DIAGNOSIS — M9901 Segmental and somatic dysfunction of cervical region: Secondary | ICD-10-CM | POA: Diagnosis not present

## 2022-07-31 DIAGNOSIS — M5431 Sciatica, right side: Secondary | ICD-10-CM | POA: Diagnosis not present

## 2022-07-31 DIAGNOSIS — M9903 Segmental and somatic dysfunction of lumbar region: Secondary | ICD-10-CM | POA: Diagnosis not present

## 2022-08-03 DIAGNOSIS — J301 Allergic rhinitis due to pollen: Secondary | ICD-10-CM | POA: Diagnosis not present

## 2022-08-03 DIAGNOSIS — J329 Chronic sinusitis, unspecified: Secondary | ICD-10-CM | POA: Diagnosis not present

## 2022-08-13 ENCOUNTER — Other Ambulatory Visit: Payer: Self-pay | Admitting: Internal Medicine

## 2022-08-14 DIAGNOSIS — M5431 Sciatica, right side: Secondary | ICD-10-CM | POA: Diagnosis not present

## 2022-08-14 DIAGNOSIS — M9903 Segmental and somatic dysfunction of lumbar region: Secondary | ICD-10-CM | POA: Diagnosis not present

## 2022-08-14 DIAGNOSIS — M5031 Other cervical disc degeneration,  high cervical region: Secondary | ICD-10-CM | POA: Diagnosis not present

## 2022-08-14 DIAGNOSIS — M9901 Segmental and somatic dysfunction of cervical region: Secondary | ICD-10-CM | POA: Diagnosis not present

## 2022-08-23 DIAGNOSIS — J301 Allergic rhinitis due to pollen: Secondary | ICD-10-CM | POA: Diagnosis not present

## 2022-08-23 DIAGNOSIS — J342 Deviated nasal septum: Secondary | ICD-10-CM | POA: Diagnosis not present

## 2022-08-23 DIAGNOSIS — J329 Chronic sinusitis, unspecified: Secondary | ICD-10-CM | POA: Diagnosis not present

## 2022-08-28 ENCOUNTER — Other Ambulatory Visit: Payer: Self-pay | Admitting: Internal Medicine

## 2022-08-28 DIAGNOSIS — M9901 Segmental and somatic dysfunction of cervical region: Secondary | ICD-10-CM | POA: Diagnosis not present

## 2022-08-28 DIAGNOSIS — M5031 Other cervical disc degeneration,  high cervical region: Secondary | ICD-10-CM | POA: Diagnosis not present

## 2022-08-28 DIAGNOSIS — M5431 Sciatica, right side: Secondary | ICD-10-CM | POA: Diagnosis not present

## 2022-08-28 DIAGNOSIS — M9903 Segmental and somatic dysfunction of lumbar region: Secondary | ICD-10-CM | POA: Diagnosis not present

## 2022-09-12 DIAGNOSIS — M9903 Segmental and somatic dysfunction of lumbar region: Secondary | ICD-10-CM | POA: Diagnosis not present

## 2022-09-12 DIAGNOSIS — M5431 Sciatica, right side: Secondary | ICD-10-CM | POA: Diagnosis not present

## 2022-09-12 DIAGNOSIS — M5031 Other cervical disc degeneration,  high cervical region: Secondary | ICD-10-CM | POA: Diagnosis not present

## 2022-09-12 DIAGNOSIS — M9901 Segmental and somatic dysfunction of cervical region: Secondary | ICD-10-CM | POA: Diagnosis not present

## 2022-09-25 DIAGNOSIS — M5031 Other cervical disc degeneration,  high cervical region: Secondary | ICD-10-CM | POA: Diagnosis not present

## 2022-09-25 DIAGNOSIS — M9901 Segmental and somatic dysfunction of cervical region: Secondary | ICD-10-CM | POA: Diagnosis not present

## 2022-09-25 DIAGNOSIS — M9903 Segmental and somatic dysfunction of lumbar region: Secondary | ICD-10-CM | POA: Diagnosis not present

## 2022-09-25 DIAGNOSIS — M5431 Sciatica, right side: Secondary | ICD-10-CM | POA: Diagnosis not present

## 2022-10-09 DIAGNOSIS — M9903 Segmental and somatic dysfunction of lumbar region: Secondary | ICD-10-CM | POA: Diagnosis not present

## 2022-10-09 DIAGNOSIS — M5031 Other cervical disc degeneration,  high cervical region: Secondary | ICD-10-CM | POA: Diagnosis not present

## 2022-10-09 DIAGNOSIS — M5431 Sciatica, right side: Secondary | ICD-10-CM | POA: Diagnosis not present

## 2022-10-09 DIAGNOSIS — M9901 Segmental and somatic dysfunction of cervical region: Secondary | ICD-10-CM | POA: Diagnosis not present

## 2022-10-24 DIAGNOSIS — M9903 Segmental and somatic dysfunction of lumbar region: Secondary | ICD-10-CM | POA: Diagnosis not present

## 2022-10-24 DIAGNOSIS — M5031 Other cervical disc degeneration,  high cervical region: Secondary | ICD-10-CM | POA: Diagnosis not present

## 2022-10-24 DIAGNOSIS — M9901 Segmental and somatic dysfunction of cervical region: Secondary | ICD-10-CM | POA: Diagnosis not present

## 2022-10-24 DIAGNOSIS — M5431 Sciatica, right side: Secondary | ICD-10-CM | POA: Diagnosis not present

## 2022-11-01 ENCOUNTER — Other Ambulatory Visit: Payer: Self-pay | Admitting: Internal Medicine

## 2022-11-06 DIAGNOSIS — M5031 Other cervical disc degeneration,  high cervical region: Secondary | ICD-10-CM | POA: Diagnosis not present

## 2022-11-06 DIAGNOSIS — M9903 Segmental and somatic dysfunction of lumbar region: Secondary | ICD-10-CM | POA: Diagnosis not present

## 2022-11-06 DIAGNOSIS — M9901 Segmental and somatic dysfunction of cervical region: Secondary | ICD-10-CM | POA: Diagnosis not present

## 2022-11-06 DIAGNOSIS — M5431 Sciatica, right side: Secondary | ICD-10-CM | POA: Diagnosis not present

## 2022-11-08 ENCOUNTER — Ambulatory Visit (HOSPITAL_BASED_OUTPATIENT_CLINIC_OR_DEPARTMENT_OTHER): Payer: Medicare Other | Admitting: Pulmonary Disease

## 2022-11-10 ENCOUNTER — Other Ambulatory Visit: Payer: Self-pay | Admitting: Internal Medicine

## 2022-11-16 LAB — HM DIABETES EYE EXAM

## 2022-11-18 ENCOUNTER — Telehealth: Payer: Medicare Other | Admitting: Family Medicine

## 2022-11-18 DIAGNOSIS — J019 Acute sinusitis, unspecified: Secondary | ICD-10-CM

## 2022-11-18 MED ORDER — DOXYCYCLINE HYCLATE 100 MG PO TABS
100.0000 mg | ORAL_TABLET | Freq: Two times a day (BID) | ORAL | 0 refills | Status: AC
Start: 2022-11-18 — End: 2022-11-28

## 2022-11-18 NOTE — Progress Notes (Signed)

## 2022-11-20 DIAGNOSIS — M5031 Other cervical disc degeneration,  high cervical region: Secondary | ICD-10-CM | POA: Diagnosis not present

## 2022-11-20 DIAGNOSIS — M9903 Segmental and somatic dysfunction of lumbar region: Secondary | ICD-10-CM | POA: Diagnosis not present

## 2022-11-20 DIAGNOSIS — M5431 Sciatica, right side: Secondary | ICD-10-CM | POA: Diagnosis not present

## 2022-11-20 DIAGNOSIS — M9901 Segmental and somatic dysfunction of cervical region: Secondary | ICD-10-CM | POA: Diagnosis not present

## 2022-11-27 ENCOUNTER — Other Ambulatory Visit: Payer: Self-pay | Admitting: Internal Medicine

## 2022-12-24 ENCOUNTER — Encounter: Payer: Self-pay | Admitting: Internal Medicine

## 2022-12-24 NOTE — Progress Notes (Unsigned)
Subjective:    Patient ID: [name], male    DOB: [birth year], 71 y.o.   MRN: [id number]     HPI [name] is here for follow up of his chronic medical problems.  Having sinus surgery in the next couple of months. He will then got a oral appliance for his sleep apnea.    Left side of throat feels raw.  He often will wake up with that feeling and then it goes away.  He does have chronic postnasal drainage  Medications and allergies reviewed with patient and updated if appropriate.  Current Outpatient Medications on File Prior to Visit  Medication Sig Dispense Refill   allopurinol (ZYLOPRIM) 100 MG tablet Take 1 tablet by mouth twice daily 180 tablet 0   Ascorbic Acid (VITAMIN C WITH ROSE HIPS) 500 MG tablet Take 1,000 mg by mouth daily.     atorvastatin (LIPITOR) 10 MG tablet Take 1 tablet by mouth once daily 90 tablet 1   Azelastine HCl 0.15 % SOLN Place 1-2 sprays into both nostrils 2 (two) times daily as needed. 30 mL 5   B Complex-C (SUPER B COMPLEX PO) Take 1 tablet by mouth daily.     Calcium Carbonate-Vitamin D (CALCIUM-VITAMIN D3 PO) Take 1 tablet by mouth every other day.     Cholecalciferol (VITAMIN D3) 1000 units CAPS Take 1,000 Units by mouth daily.     Garlic 1000 MG CAPS Take 1,000 mg by mouth every other day.     glucosamine-chondroitin 500-400 MG tablet Take 2 tablets by mouth daily.     levocetirizine (XYZAL) 5 MG tablet Take 10 mg by mouth every evening.     levothyroxine (SYNTHROID) 112 MCG tablet Take 1 tablet (112 mcg total) by mouth daily. 90 tablet 3   meloxicam (MOBIC) 15 MG tablet TAKE 1 TABLET BY MOUTH ONCE DAILY AS NEEDED WITH FOOD FOR PAIN 90 tablet 0   metFORMIN (GLUCOPHAGE-XR) 500 MG 24 hr tablet Take 2 tablets (1,000 mg total) by mouth 2 (two) times daily with a meal. 360 tablet 3   montelukast (SINGULAIR) 10 MG tablet Take 1 tablet (10 mg total) by mouth at bedtime. 30 tablet 5   Multiple Vitamins-Minerals (MULTIVITAMIN ADULTS 50+ PO) Take 1  tablet by mouth daily.     pantoprazole (PROTONIX) 40 MG tablet Take 1 tablet (40 mg total) by mouth daily. 90 tablet 3   vitamin E 400 UNIT capsule Take 400 Units by mouth every other day.     Zinc 50 MG TABS Take by mouth.     Magnesium 100 MG TABS Take 100 mg by mouth every other day. (Patient not taking: Reported on 12/28/2022)     [DISCONTINUED] Azelastine-Fluticasone 137-50 MCG/ACT SUSP Place 1 spray into the nose 2 (two) times daily as needed. (Patient not taking: Reported on 09/16/2019) 23 g 2   [DISCONTINUED] fluticasone (FLONASE) 50 MCG/ACT nasal spray Place 2 sprays into both nostrils daily. (Patient not taking: Reported on 09/16/2019) 1 g 5   No current facility-administered medications on file prior to visit.     Review of Systems  Constitutional:  Negative for fever.  HENT:  Positive for postnasal drip, sinus pressure (left maxillary - pressure - chornic) and sore throat (raw on left side).   Respiratory:  Positive for cough (from pnd). Negative for shortness of breath and wheezing.   Cardiovascular:  Negative for chest pain, palpitations and leg swelling.  Neurological:  Positive for headaches (every morning). Negative  for light-headedness (sometimes).       Objective:   Vitals:   12/28/22 0802  BP: (!) 140/80  Pulse: 65  SpO2: 98%   BP Readings from Last 3 Encounters:  12/28/22 (!) 140/80  06/28/22 130/72  02/22/22 (!) 140/82   Wt Readings from Last 3 Encounters:  12/28/22 202 lb (91.6 kg)  07/06/22 195 lb (88.5 kg)  06/28/22 195 lb 6 oz (88.6 kg)   Body mass index is 29.83 kg/m.    Physical Exam Constitutional:      General: He is not in acute distress.    Appearance: Normal appearance. He is not ill-appearing.  HENT:     Head: Normocephalic and atraumatic.     Mouth/Throat:     Mouth: Mucous membranes are moist.     Pharynx: No oropharyngeal exudate or posterior oropharyngeal erythema.  Eyes:     Conjunctiva/sclera: Conjunctivae normal.   Cardiovascular:     Rate and Rhythm: Normal rate and regular rhythm.     Heart sounds: Normal heart sounds.  Pulmonary:     Effort: Pulmonary effort is normal. No respiratory distress.     Breath sounds: Normal breath sounds. No wheezing or rales.  Musculoskeletal:     Cervical back: Neck supple. No tenderness.     Right lower leg: No edema.     Left lower leg: No edema.  Lymphadenopathy:     Cervical: No cervical adenopathy.  Skin:    General: Skin is warm and dry.     Findings: No rash.  Neurological:     Mental Status: He is alert. Mental status is at baseline.  Psychiatric:        Mood and Affect: Mood normal.        Lab Results  Component Value Date   WBC 6.5 12/28/2022   HGB 14.2 12/28/2022   HCT 43.3 12/28/2022   PLT 286.0 12/28/2022   GLUCOSE 119 (H) 12/28/2022   CHOL 118 12/28/2022   TRIG 104.0 12/28/2022   HDL 40.30 12/28/2022   LDLCALC 57 12/28/2022   ALT 18 12/28/2022   AST 23 12/28/2022   NA 137 12/28/2022   K 4.2 12/28/2022   CL 101 12/28/2022   CREATININE 1.27 12/28/2022   BUN 27 (H) 12/28/2022   CO2 27 12/28/2022   TSH 2.39 12/28/2022   PSA 0.34 06/28/2022   INR 1.0 01/30/2017   HGBA1C 7.1 (H) 12/28/2022   MICROALBUR 23.4 (H) 06/28/2022     Assessment & Plan:    See Problem List for Assessment and Plan of chronic medical problems.

## 2022-12-24 NOTE — Patient Instructions (Addendum)
      Blood work was ordered.       Medications changes include :   stop losartan-hydrochlorothiazide and start valsartan- amlodipine 160-5 mg daily for your BP   Do not take any advil, aleve, ibuprofen or goody powders.     Return in about 6 months (around 06/28/2023) for Physical Exam.

## 2022-12-27 DIAGNOSIS — J320 Chronic maxillary sinusitis: Secondary | ICD-10-CM | POA: Diagnosis not present

## 2022-12-28 ENCOUNTER — Ambulatory Visit: Payer: Medicare Other | Admitting: Internal Medicine

## 2022-12-28 VITALS — BP 140/80 | HR 65 | Ht 69.0 in | Wt 202.0 lb

## 2022-12-28 DIAGNOSIS — M159 Polyosteoarthritis, unspecified: Secondary | ICD-10-CM | POA: Diagnosis not present

## 2022-12-28 DIAGNOSIS — M109 Gout, unspecified: Secondary | ICD-10-CM

## 2022-12-28 DIAGNOSIS — E782 Mixed hyperlipidemia: Secondary | ICD-10-CM | POA: Diagnosis not present

## 2022-12-28 DIAGNOSIS — K219 Gastro-esophageal reflux disease without esophagitis: Secondary | ICD-10-CM | POA: Diagnosis not present

## 2022-12-28 DIAGNOSIS — Z7984 Long term (current) use of oral hypoglycemic drugs: Secondary | ICD-10-CM | POA: Diagnosis not present

## 2022-12-28 DIAGNOSIS — E118 Type 2 diabetes mellitus with unspecified complications: Secondary | ICD-10-CM | POA: Diagnosis not present

## 2022-12-28 DIAGNOSIS — I700 Atherosclerosis of aorta: Secondary | ICD-10-CM

## 2022-12-28 DIAGNOSIS — E039 Hypothyroidism, unspecified: Secondary | ICD-10-CM | POA: Diagnosis not present

## 2022-12-28 DIAGNOSIS — I10 Essential (primary) hypertension: Secondary | ICD-10-CM | POA: Diagnosis not present

## 2022-12-28 LAB — COMPREHENSIVE METABOLIC PANEL
ALT: 18 U/L (ref 0–53)
AST: 23 U/L (ref 0–37)
Albumin: 4.3 g/dL (ref 3.5–5.2)
Alkaline Phosphatase: 68 U/L (ref 39–117)
BUN: 27 mg/dL — ABNORMAL HIGH (ref 6–23)
CO2: 27 meq/L (ref 19–32)
Calcium: 9.6 mg/dL (ref 8.4–10.5)
Chloride: 101 meq/L (ref 96–112)
Creatinine, Ser: 1.27 mg/dL (ref 0.40–1.50)
GFR: 57.4 mL/min — ABNORMAL LOW (ref >60.00)
Glucose, Bld: 119 mg/dL — ABNORMAL HIGH (ref 70–99)
Potassium: 4.2 meq/L (ref 3.5–5.1)
Sodium: 137 meq/L (ref 135–145)
Total Bilirubin: 0.5 mg/dL (ref 0.2–1.2)
Total Protein: 7.1 g/dL (ref 6.0–8.3)

## 2022-12-28 LAB — CBC WITH DIFFERENTIAL/PLATELET
Basophils Absolute: 0.1 10*3/uL (ref 0.0–0.1)
Basophils Relative: 1.8 % (ref 0.0–3.0)
Eosinophils Absolute: 1 10*3/uL — ABNORMAL HIGH (ref 0.0–0.7)
Eosinophils Relative: 15.7 % — ABNORMAL HIGH (ref 0.0–5.0)
HCT: 43.3 % (ref 39.0–52.0)
Hemoglobin: 14.2 g/dL (ref 13.0–17.0)
Lymphocytes Relative: 22.9 % (ref 12.0–46.0)
Lymphs Abs: 1.5 10*3/uL (ref 0.7–4.0)
MCHC: 32.9 g/dL (ref 30.0–36.0)
MCV: 92.9 fL (ref 78.0–100.0)
Monocytes Absolute: 0.6 10*3/uL (ref 0.1–1.0)
Monocytes Relative: 8.6 % (ref 3.0–12.0)
Neutro Abs: 3.3 10*3/uL (ref 1.4–7.7)
Neutrophils Relative %: 51 % (ref 43.0–77.0)
Platelets: 286 10*3/uL (ref 150.0–400.0)
RBC: 4.66 Mil/uL (ref 4.22–5.81)
RDW: 14.7 % (ref 11.5–15.5)
WBC: 6.5 10*3/uL (ref 4.0–10.5)

## 2022-12-28 LAB — LIPID PANEL
Cholesterol: 118 mg/dL (ref 0–200)
HDL: 40.3 mg/dL (ref >39.00)
LDL Cholesterol: 57 mg/dL (ref 0–99)
NonHDL: 77.84
Total CHOL/HDL Ratio: 3
Triglycerides: 104 mg/dL (ref 0.0–149.0)
VLDL: 20.8 mg/dL (ref 0.0–40.0)

## 2022-12-28 LAB — TSH: TSH: 2.39 u[IU]/mL (ref 0.35–5.50)

## 2022-12-28 LAB — HEMOGLOBIN A1C: Hgb A1c MFr Bld: 7.1 % — ABNORMAL HIGH (ref 4.6–6.5)

## 2022-12-28 MED ORDER — AMLODIPINE BESYLATE-VALSARTAN 5-160 MG PO TABS: 1.0000 | ORAL_TABLET | Freq: Every day | ORAL | 1 refills | Status: DC

## 2022-12-28 NOTE — Assessment & Plan Note (Signed)
Chronic Continue meloxicam 15 mg daily as needed-discussed avoiding regular use because of possible side effects 

## 2022-12-28 NOTE — Assessment & Plan Note (Signed)
Chronic GERD controlled Continue pantoprazole 40 mg daily 

## 2022-12-28 NOTE — Assessment & Plan Note (Signed)
Chronic  Clinically euthyroid Check tsh and will titrate med dose if needed Currently taking levothyroxine 112 mcg daily  

## 2022-12-28 NOTE — Assessment & Plan Note (Signed)
Chronic Continue atorvastatin 10 mg daily  LDL at goal Healthy diet and increased activity encouraged  Lab Results  Component Value Date   LDLCALC 44 06/28/2022

## 2022-12-28 NOTE — Assessment & Plan Note (Addendum)
Chronic Blood pressure not controlled CMP Stop losartan-HCTZ 100-12.5 mg daily Start valsartan-amlodipine 160-5 mg daily Monitor BP

## 2022-12-28 NOTE — Assessment & Plan Note (Signed)
Chronic Regular exercise and healthy diet encouraged Check lipid panel  Continue atorvastatin 10 mg daily 

## 2022-12-28 NOTE — Assessment & Plan Note (Signed)
Chronic Controlled, Stable Continue allopurinol 100 mg twice daily

## 2022-12-28 NOTE — Assessment & Plan Note (Signed)
Chronic  Lab Results  Component Value Date   HGBA1C 6.7 (H) 06/28/2022   Sugars  controlled Testing sugars 1 times a day Check A1c Continue metformin XR 1000 mg bid Stressed regular exercise, diabetic diet

## 2023-01-01 ENCOUNTER — Other Ambulatory Visit: Payer: Self-pay | Admitting: Internal Medicine

## 2023-01-07 MED ORDER — EMPAGLIFLOZIN 10 MG PO TABS: 10.0000 mg | ORAL_TABLET | Freq: Every day | ORAL | 5 refills | Status: DC

## 2023-01-15 DIAGNOSIS — J342 Deviated nasal septum: Secondary | ICD-10-CM | POA: Diagnosis not present

## 2023-01-15 DIAGNOSIS — J322 Chronic ethmoidal sinusitis: Secondary | ICD-10-CM | POA: Diagnosis not present

## 2023-01-15 DIAGNOSIS — J3489 Other specified disorders of nose and nasal sinuses: Secondary | ICD-10-CM | POA: Diagnosis not present

## 2023-01-15 DIAGNOSIS — J328 Other chronic sinusitis: Secondary | ICD-10-CM | POA: Diagnosis not present

## 2023-01-15 DIAGNOSIS — J321 Chronic frontal sinusitis: Secondary | ICD-10-CM | POA: Diagnosis not present

## 2023-01-15 DIAGNOSIS — J320 Chronic maxillary sinusitis: Secondary | ICD-10-CM | POA: Diagnosis not present

## 2023-01-22 ENCOUNTER — Ambulatory Visit (HOSPITAL_BASED_OUTPATIENT_CLINIC_OR_DEPARTMENT_OTHER): Payer: Medicare Other | Admitting: Pulmonary Disease

## 2023-01-22 ENCOUNTER — Encounter (HOSPITAL_BASED_OUTPATIENT_CLINIC_OR_DEPARTMENT_OTHER): Payer: Self-pay | Admitting: Pulmonary Disease

## 2023-01-22 VITALS — BP 124/80 | HR 85 | Ht 69.0 in | Wt 191.0 lb

## 2023-01-22 DIAGNOSIS — R053 Chronic cough: Secondary | ICD-10-CM | POA: Diagnosis not present

## 2023-01-22 DIAGNOSIS — G4733 Obstructive sleep apnea (adult) (pediatric): Secondary | ICD-10-CM | POA: Diagnosis not present

## 2023-01-22 NOTE — Progress Notes (Signed)
   Subjective:    Patient ID: [name], male    DOB: Nov 30, 1950, 73 y.o.   MRN: [id number]  HPI  73 yo  man for follow-up of OSA   PMH - HTN, allergic rhinitis, GERD, DM II, hypothyroidism,  He smoked about 2 packs/day for 40 years before he quit in 2011.  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    Discussed the use of AI scribe software for clinical note transcription with the patient, who gave verbal consent to proceed.  History of Present Illness   The patient, with a history of obstructive sleep apnea, presents after recent sinus surgery. He had stopped using his CPAP machine due to recurrent sinus infections. He underwent surgery to clean out scar tissue and correct a deviated septum. He is currently healing from the procedure and will have a follow-up appointment to ensure the success of the surgery. He has decided against an Inspire implant and instead plans to have a dental device made to help with his sleep apnea. He is also dealing with a chronic cough, which has worsened since his sinus surgery. The cough is dry and can last for several minutes, sometimes keeping him up at night.       Significant tests/ events reviewed   10/2021 HST >> AHI 33/h, low sat 75% 07/17/2017 HST: AHI 23.5/hr, SaO2 low 77%   LDCT chest 05/2022 RADS 2S 05/23/2021 LDCT: atherosclerosis. Mild centrilobular emphysema with diffuse bronchial wall thickening. Lung RADS 2 . Positive allergens to molds, cat, and dog dander.   Review of Systems neg for any significant sore throat, dysphagia, itching, sneezing, nasal congestion or excess/ purulent secretions, fever, chills, sweats, unintended wt loss, pleuritic or exertional cp, hempoptysis, orthopnea pnd or change in chronic leg swelling. Also denies presyncope, palpitations, heartburn, abdominal pain, nausea, vomiting,  diarrhea or change in bowel or urinary habits, dysuria,hematuria, rash, arthralgias, visual complaints, headache, numbness weakness or ataxia.     Objective:   Physical Exam  Gen. Pleasant, obese, in no distress ENT - no lesions, no post nasal drip Neck: No JVD, no thyromegaly, no carotid bruits Lungs: no use of accessory muscles, no dullness to percussion, decreased without rales or rhonchi  Cardiovascular: Rhythm regular, heart sounds  normal, no murmurs or gallops, no peripheral edema Musculoskeletal: No deformities, no cyanosis or clubbing , no tremors       Assessment & Plan:    Assessment and Plan    Obstructive Sleep Apnea Obstructive sleep apnea previously managed with CPAP, discontinued due to recurrent sinus infections. Post-septoplasty and stent placement. Considering an oral appliance, declined Inspire implant. Prefers dental appliance by Dr. Dick Shaker. Plan includes adjusting the appliance and performing a home sleep test to evaluate its effectiveness. - Proceed with dental evaluation for oral appliance - Adjust oral appliance as needed - Schedule home sleep test after oral appliance adjustment  Post-Intubation Cough Persistent dry cough post-sinus surgery, likely secondary to intubation. No significant lung findings. Tried quercetin and home remedies without relief. - Recommend Delsym cough syrup, 2-3 times daily - Reassess in a couple of weeks if symptoms persist  Follow-up - Follow up with ENT on January 31, 2023 for post-surgical evaluation - Follow up with pulmonology in one year or sooner if needed after dental appliance adjustment.

## 2023-01-22 NOTE — Patient Instructions (Addendum)
 Delsym cough syrup 5ml thrice daily as needed  call us once you have settled down with dental device & we can arrange for home sleep test

## 2023-01-25 ENCOUNTER — Other Ambulatory Visit: Payer: Self-pay | Admitting: Internal Medicine

## 2023-01-31 DIAGNOSIS — J320 Chronic maxillary sinusitis: Secondary | ICD-10-CM | POA: Diagnosis not present

## 2023-02-15 ENCOUNTER — Other Ambulatory Visit: Payer: Self-pay | Admitting: Internal Medicine

## 2023-02-20 ENCOUNTER — Ambulatory Visit: Payer: Medicare Other | Admitting: Internal Medicine

## 2023-02-20 ENCOUNTER — Encounter: Payer: Self-pay | Admitting: Internal Medicine

## 2023-02-20 VITALS — BP 140/70 | HR 80 | Temp 98.0°F | Ht 69.0 in | Wt 190.0 lb

## 2023-02-20 DIAGNOSIS — J069 Acute upper respiratory infection, unspecified: Secondary | ICD-10-CM | POA: Diagnosis not present

## 2023-02-20 DIAGNOSIS — I10 Essential (primary) hypertension: Secondary | ICD-10-CM | POA: Diagnosis not present

## 2023-02-20 LAB — POCT RESPIRATORY SYNCYTIAL VIRUS: RSV Rapid Ag: NEGATIVE

## 2023-02-20 LAB — POC INFLUENZA A&B (BINAX/QUICKVUE)
Influenza A, POC: NEGATIVE
Influenza B, POC: NEGATIVE

## 2023-02-20 LAB — POC COVID19 BINAXNOW: SARS Coronavirus 2 Ag: NEGATIVE

## 2023-02-20 MED ORDER — DOXYCYCLINE HYCLATE 100 MG PO TABS: 100.0000 mg | ORAL_TABLET | Freq: Two times a day (BID) | ORAL | 0 refills | Status: AC

## 2023-02-20 MED ORDER — HYDROCODONE BIT-HOMATROP MBR 5-1.5 MG/5ML PO SOLN: 5.0000 mL | Freq: Three times a day (TID) | ORAL | 0 refills | Status: DC | PRN

## 2023-02-20 NOTE — Patient Instructions (Addendum)
       Medications changes include :   None        Return if symptoms worsen or fail to improve.

## 2023-02-20 NOTE — Assessment & Plan Note (Signed)
Chronic Blood pressure not controlled here today, but he did take Mucinex DM which could be contributing to the elevated BP in addition to him being sick No changes in medication today Monitor BP at home Continue valsartan-amlodipine 160-5 mg daily

## 2023-02-20 NOTE — Addendum Note (Signed)
Addended by: Karma Ganja on: 02/20/2023 04:47 PM   Modules accepted: Orders

## 2023-02-20 NOTE — Assessment & Plan Note (Signed)
Acute Symptoms ongoing for 8-9 days S/p sinus surgery last year Concern for possible bacterial infection Start doxycycline 100 mg twice daily x 10 days Hycodan cough syrup Continue symptomatic treatment Advised him to call if his symptoms are not improving

## 2023-02-20 NOTE — Progress Notes (Signed)
Subjective:    Patient ID: [name], male    DOB: [birth year], 73 y.o.   MRN: [id number]      HPI Carmin is here for  Chief Complaint  Patient presents with   Cough    Cough a little over a week; Sore throat at times, headache, chest discomfort at night (at night he hears a rattling sound) Cough at least 8-9 days    He is here for an acute visit for cold symptoms.  He had sinus surgery last year.  His symptoms started 8-9 days ago  He is experiencing slight decrease in appetite, fatigue, nasal congestion, sinus pressure, cough that is both dry and productive, mild shortness of breath, wheezing, headaches and dizziness.  He has tried taking over-the-counter cough medicine, Mucinex DM and he is doing sinus rinses      Medications and allergies reviewed with patient and updated if appropriate.  Current Outpatient Medications on File Prior to Visit  Medication Sig Dispense Refill   allopurinol (ZYLOPRIM) 100 MG tablet Take 1 tablet by mouth twice daily 180 tablet 0   amLODipine-valsartan (EXFORGE) 5-160 MG tablet Take 1 tablet by mouth daily. 90 tablet 1   Ascorbic Acid (VITAMIN C WITH ROSE HIPS) 500 MG tablet Take 1,000 mg by mouth daily.     atorvastatin (LIPITOR) 10 MG tablet Take 1 tablet by mouth once daily 90 tablet 0   Azelastine HCl 0.15 % SOLN Place 1-2 sprays into both nostrils 2 (two) times daily as needed. 30 mL 5   B Complex-C (SUPER B COMPLEX PO) Take 1 tablet by mouth daily.     Calcium Carbonate-Vitamin D (CALCIUM-VITAMIN D3 PO) Take 1 tablet by mouth every other day.     Cholecalciferol (VITAMIN D3) 1000 units CAPS Take 1,000 Units by mouth daily.     empagliflozin (JARDIANCE) 10 MG TABS tablet Take 1 tablet (10 mg total) by mouth daily before breakfast. 30 tablet 5   Garlic 1000 MG CAPS Take 1,000 mg by mouth every other day.     glucosamine-chondroitin 500-400 MG tablet Take 2 tablets by mouth daily.     levocetirizine (XYZAL) 5 MG tablet Take  10 mg by mouth every evening.     levothyroxine (SYNTHROID) 112 MCG tablet Take 1 tablet by mouth once daily 90 tablet 0   Magnesium 100 MG TABS Take 100 mg by mouth every other day.     meloxicam (MOBIC) 15 MG tablet TAKE 1 TABLET BY MOUTH ONCE DAILY AS NEEDED WITH FOOD FOR PAIN 90 tablet 0   metFORMIN (GLUCOPHAGE-XR) 500 MG 24 hr tablet Take 2 tablets (1,000 mg total) by mouth 2 (two) times daily with a meal. 360 tablet 3   montelukast (SINGULAIR) 10 MG tablet Take 1 tablet (10 mg total) by mouth at bedtime. 30 tablet 5   Multiple Vitamins-Minerals (MULTIVITAMIN ADULTS 50+ PO) Take 1 tablet by mouth daily.     pantoprazole (PROTONIX) 40 MG tablet Take 1 tablet by mouth once daily 90 tablet 0   vitamin E 400 UNIT capsule Take 400 Units by mouth every other day.     Zinc 50 MG TABS Take by mouth.     [DISCONTINUED] Azelastine-Fluticasone 137-50 MCG/ACT SUSP Place 1 spray into the nose 2 (two) times daily as needed. (Patient not taking: Reported on 01/22/2023) 23 g 2   [DISCONTINUED] fluticasone (FLONASE) 50 MCG/ACT nasal spray Place 2 sprays into both nostrils daily. (Patient not taking: Reported on 09/16/2019) 1 g  5   No current facility-administered medications on file prior to visit.    Review of Systems  Constitutional:  Positive for appetite change (dec) and fatigue. Negative for fever.  HENT:  Positive for congestion and sinus pressure. Negative for ear pain and sore throat (wakes up with it in morniing - resolved).   Respiratory:  Positive for cough (dry and productive), shortness of breath (mild) and wheezing.   Gastrointestinal:  Negative for diarrhea and nausea.  Musculoskeletal:  Negative for myalgias.  Neurological:  Positive for dizziness and headaches.       Objective:   Vitals:   02/20/23 1123  BP: (!) 140/70  Pulse: 80  Temp: 98 F (36.7 C)  SpO2: 97%   BP Readings from Last 3 Encounters:  02/20/23 (!) 140/70  01/22/23 124/80  12/28/22 (!) 140/80   Wt Readings  from Last 3 Encounters:  02/20/23 190 lb (86.2 kg)  01/22/23 191 lb (86.6 kg)  12/28/22 202 lb (91.6 kg)   Body mass index is 28.06 kg/m.    Physical Exam Constitutional:      General: He is not in acute distress.    Appearance: Normal appearance. He is not ill-appearing.  HENT:     Head: Normocephalic.     Right Ear: Tympanic membrane, ear canal and external ear normal. There is no impacted cerumen.     Left Ear: Tympanic membrane, ear canal and external ear normal. There is no impacted cerumen.     Mouth/Throat:     Mouth: Mucous membranes are moist.     Pharynx: No oropharyngeal exudate or posterior oropharyngeal erythema.  Eyes:     Conjunctiva/sclera: Conjunctivae normal.  Cardiovascular:     Rate and Rhythm: Normal rate and regular rhythm.  Pulmonary:     Effort: Pulmonary effort is normal. No respiratory distress.     Breath sounds: Normal breath sounds. No wheezing or rales.  Musculoskeletal:     Cervical back: Neck supple. No tenderness.  Lymphadenopathy:     Cervical: No cervical adenopathy.  Skin:    General: Skin is warm and dry.     Findings: No rash.  Neurological:     Mental Status: He is alert.            Assessment & Plan:    See Problem List for Assessment and Plan of chronic medical problems.

## 2023-02-22 DIAGNOSIS — J320 Chronic maxillary sinusitis: Secondary | ICD-10-CM | POA: Diagnosis not present

## 2023-02-26 DIAGNOSIS — M5031 Other cervical disc degeneration,  high cervical region: Secondary | ICD-10-CM | POA: Diagnosis not present

## 2023-02-26 DIAGNOSIS — M9901 Segmental and somatic dysfunction of cervical region: Secondary | ICD-10-CM | POA: Diagnosis not present

## 2023-02-26 DIAGNOSIS — M5431 Sciatica, right side: Secondary | ICD-10-CM | POA: Diagnosis not present

## 2023-02-26 DIAGNOSIS — M9903 Segmental and somatic dysfunction of lumbar region: Secondary | ICD-10-CM | POA: Diagnosis not present

## 2023-02-27 ENCOUNTER — Other Ambulatory Visit: Payer: Self-pay | Admitting: Internal Medicine

## 2023-03-12 DIAGNOSIS — M5031 Other cervical disc degeneration,  high cervical region: Secondary | ICD-10-CM | POA: Diagnosis not present

## 2023-03-12 DIAGNOSIS — M5431 Sciatica, right side: Secondary | ICD-10-CM | POA: Diagnosis not present

## 2023-03-12 DIAGNOSIS — M9901 Segmental and somatic dysfunction of cervical region: Secondary | ICD-10-CM | POA: Diagnosis not present

## 2023-03-12 DIAGNOSIS — M9903 Segmental and somatic dysfunction of lumbar region: Secondary | ICD-10-CM | POA: Diagnosis not present

## 2023-03-26 DIAGNOSIS — M5031 Other cervical disc degeneration,  high cervical region: Secondary | ICD-10-CM | POA: Diagnosis not present

## 2023-03-26 DIAGNOSIS — M5431 Sciatica, right side: Secondary | ICD-10-CM | POA: Diagnosis not present

## 2023-03-26 DIAGNOSIS — M9901 Segmental and somatic dysfunction of cervical region: Secondary | ICD-10-CM | POA: Diagnosis not present

## 2023-03-26 DIAGNOSIS — M9903 Segmental and somatic dysfunction of lumbar region: Secondary | ICD-10-CM | POA: Diagnosis not present

## 2023-04-04 DIAGNOSIS — H2513 Age-related nuclear cataract, bilateral: Secondary | ICD-10-CM | POA: Diagnosis not present

## 2023-04-09 DIAGNOSIS — M5431 Sciatica, right side: Secondary | ICD-10-CM | POA: Diagnosis not present

## 2023-04-09 DIAGNOSIS — M9901 Segmental and somatic dysfunction of cervical region: Secondary | ICD-10-CM | POA: Diagnosis not present

## 2023-04-09 DIAGNOSIS — M9903 Segmental and somatic dysfunction of lumbar region: Secondary | ICD-10-CM | POA: Diagnosis not present

## 2023-04-09 DIAGNOSIS — M5031 Other cervical disc degeneration,  high cervical region: Secondary | ICD-10-CM | POA: Diagnosis not present

## 2023-04-12 DIAGNOSIS — E119 Type 2 diabetes mellitus without complications: Secondary | ICD-10-CM | POA: Diagnosis not present

## 2023-04-12 DIAGNOSIS — H401134 Primary open-angle glaucoma, bilateral, indeterminate stage: Secondary | ICD-10-CM | POA: Diagnosis not present

## 2023-04-12 DIAGNOSIS — H04123 Dry eye syndrome of bilateral lacrimal glands: Secondary | ICD-10-CM | POA: Diagnosis not present

## 2023-04-12 DIAGNOSIS — H2513 Age-related nuclear cataract, bilateral: Secondary | ICD-10-CM | POA: Diagnosis not present

## 2023-04-23 DIAGNOSIS — H2512 Age-related nuclear cataract, left eye: Secondary | ICD-10-CM | POA: Diagnosis not present

## 2023-04-23 DIAGNOSIS — M9903 Segmental and somatic dysfunction of lumbar region: Secondary | ICD-10-CM | POA: Diagnosis not present

## 2023-04-23 DIAGNOSIS — M5031 Other cervical disc degeneration,  high cervical region: Secondary | ICD-10-CM | POA: Diagnosis not present

## 2023-04-23 DIAGNOSIS — M5431 Sciatica, right side: Secondary | ICD-10-CM | POA: Diagnosis not present

## 2023-04-23 DIAGNOSIS — M9901 Segmental and somatic dysfunction of cervical region: Secondary | ICD-10-CM | POA: Diagnosis not present

## 2023-04-24 ENCOUNTER — Other Ambulatory Visit: Payer: Self-pay | Admitting: Internal Medicine

## 2023-05-08 DIAGNOSIS — H2512 Age-related nuclear cataract, left eye: Secondary | ICD-10-CM | POA: Diagnosis not present

## 2023-05-14 DIAGNOSIS — H2511 Age-related nuclear cataract, right eye: Secondary | ICD-10-CM | POA: Diagnosis not present

## 2023-05-16 ENCOUNTER — Other Ambulatory Visit: Payer: Self-pay | Admitting: Acute Care

## 2023-05-16 DIAGNOSIS — Z87891 Personal history of nicotine dependence: Secondary | ICD-10-CM

## 2023-05-16 DIAGNOSIS — Z122 Encounter for screening for malignant neoplasm of respiratory organs: Secondary | ICD-10-CM

## 2023-05-22 DIAGNOSIS — H2511 Age-related nuclear cataract, right eye: Secondary | ICD-10-CM | POA: Diagnosis not present

## 2023-05-22 DIAGNOSIS — H268 Other specified cataract: Secondary | ICD-10-CM | POA: Diagnosis not present

## 2023-05-23 ENCOUNTER — Other Ambulatory Visit: Payer: Self-pay | Admitting: Internal Medicine

## 2023-05-24 ENCOUNTER — Other Ambulatory Visit: Payer: Self-pay | Admitting: Internal Medicine

## 2023-06-11 ENCOUNTER — Ambulatory Visit

## 2023-06-11 DIAGNOSIS — Z87891 Personal history of nicotine dependence: Secondary | ICD-10-CM

## 2023-06-11 DIAGNOSIS — Z122 Encounter for screening for malignant neoplasm of respiratory organs: Secondary | ICD-10-CM | POA: Diagnosis not present

## 2023-06-25 ENCOUNTER — Other Ambulatory Visit: Payer: Self-pay

## 2023-06-25 DIAGNOSIS — M9903 Segmental and somatic dysfunction of lumbar region: Secondary | ICD-10-CM | POA: Diagnosis not present

## 2023-06-25 DIAGNOSIS — M5431 Sciatica, right side: Secondary | ICD-10-CM | POA: Diagnosis not present

## 2023-06-25 DIAGNOSIS — M5031 Other cervical disc degeneration,  high cervical region: Secondary | ICD-10-CM | POA: Diagnosis not present

## 2023-06-25 DIAGNOSIS — M9901 Segmental and somatic dysfunction of cervical region: Secondary | ICD-10-CM | POA: Diagnosis not present

## 2023-06-28 ENCOUNTER — Ambulatory Visit: Payer: Medicare Other | Admitting: Internal Medicine

## 2023-07-05 ENCOUNTER — Ambulatory Visit: Admitting: Internal Medicine

## 2023-07-09 ENCOUNTER — Encounter: Payer: Self-pay | Admitting: Internal Medicine

## 2023-07-09 NOTE — Patient Instructions (Addendum)
      Blood work was ordered.       Medications changes include :   inc exforge      An ultrasound of your carotid arteries was ordered and someone will call you to schedule an appointment.     Return in about 6 months (around 01/10/2024) for Physical Exam.

## 2023-07-09 NOTE — Progress Notes (Unsigned)
 Subjective:    Patient ID: [name], male    DOB: 02-05-1950, 73 y.o.   MRN: [id number]     HPI [name] is here for follow up of his chronic medical problems.  Had cataracts done.    Has not taken a goody powder or ibuprofen since he was here last.  Not monitoring his blood pressure at home.  Medications and allergies reviewed with patient and updated if appropriate.  Current Outpatient Medications on File Prior to Visit  Medication Sig Dispense Refill   allopurinol  (ZYLOPRIM ) 100 MG tablet Take 1 tablet by mouth twice daily 180 tablet 0   Ascorbic Acid (VITAMIN C WITH ROSE HIPS) 500 MG tablet Take 1,000 mg by mouth daily.     atorvastatin  (LIPITOR) 10 MG tablet Take 1 tablet by mouth once daily 90 tablet 0   Azelastine  HCl 0.15 % SOLN Place 1-2 sprays into both nostrils 2 (two) times daily as needed. 30 mL 5   B Complex-C (SUPER B COMPLEX PO) Take 1 tablet by mouth daily.     Calcium  Carbonate-Vitamin D (CALCIUM -VITAMIN D3 PO) Take 1 tablet by mouth every other day.     Cholecalciferol (VITAMIN D3) 1000 units CAPS Take 1,000 Units by mouth daily.     empagliflozin  (JARDIANCE ) 10 MG TABS tablet Take 1 tablet (10 mg total) by mouth daily before breakfast. 30 tablet 5   Garlic 1000 MG CAPS Take 1,000 mg by mouth every other day.     glucosamine-chondroitin 500-400 MG tablet Take 2 tablets by mouth daily.     levocetirizine (XYZAL) 5 MG tablet Take 10 mg by mouth every evening.     levothyroxine  (SYNTHROID ) 112 MCG tablet Take 1 tablet by mouth once daily 90 tablet 0   Magnesium 100 MG TABS Take 100 mg by mouth every other day.     meloxicam  (MOBIC ) 15 MG tablet TAKE 1 TABLET BY MOUTH ONCE DAILY AS NEEDED WITH FOOD FOR PAIN 90 tablet 0   metFORMIN  (GLUCOPHAGE -XR) 500 MG 24 hr tablet TAKE 2 TABLETS BY MOUTH TWICE DAILY WITH A MEAL 360 tablet 0   montelukast  (SINGULAIR ) 10 MG tablet Take 1 tablet (10 mg total) by mouth at bedtime. 30 tablet 5   Multiple  Vitamins-Minerals (MULTIVITAMIN ADULTS 50+ PO) Take 1 tablet by mouth daily.     pantoprazole  (PROTONIX ) 40 MG tablet Take 1 tablet by mouth once daily 90 tablet 0   vitamin E 400 UNIT capsule Take 400 Units by mouth every other day.     Zinc 50 MG TABS Take by mouth.     [DISCONTINUED] Azelastine -Fluticasone  137-50 MCG/ACT SUSP Place 1 spray into the nose 2 (two) times daily as needed. (Patient not taking: Reported on 01/22/2023) 23 g 2   [DISCONTINUED] fluticasone  (FLONASE ) 50 MCG/ACT nasal spray Place 2 sprays into both nostrils daily. (Patient not taking: Reported on 09/16/2019) 1 g 5   No current facility-administered medications on file prior to visit.     Review of Systems  Constitutional:  Negative for fever.  Respiratory:  Positive for shortness of breath (with strenuous exertion). Negative for cough and wheezing.   Cardiovascular:  Positive for leg swelling (a couple of days when it was hot). Negative for chest pain and palpitations.  Genitourinary:  Positive for frequency (at night - 5-6 times).  Neurological:  Negative for light-headedness and headaches.       Objective:   Vitals:   07/10/23 1429 07/10/23 1513  BP: ROLLEN)  140/76 132/70  Pulse: 76   Temp: 98 F (36.7 C)   SpO2: 95%    BP Readings from Last 3 Encounters:  07/10/23 132/70  02/20/23 (!) 140/70  01/22/23 124/80   Wt Readings from Last 3 Encounters:  07/10/23 193 lb (87.5 kg)  06/11/23 190 lb (86.2 kg)  02/20/23 190 lb (86.2 kg)   Body mass index is 28.5 kg/m.    Physical Exam Constitutional:      General: He is not in acute distress.    Appearance: Normal appearance. He is not ill-appearing.  HENT:     Head: Normocephalic and atraumatic.   Eyes:     Conjunctiva/sclera: Conjunctivae normal.    Cardiovascular:     Rate and Rhythm: Normal rate and regular rhythm.     Heart sounds: Normal heart sounds.  Pulmonary:     Effort: Pulmonary effort is normal. No respiratory distress.     Breath  sounds: Normal breath sounds. No wheezing or rales.   Musculoskeletal:     Right lower leg: No edema.     Left lower leg: No edema.   Skin:    General: Skin is warm and dry.     Findings: No rash.   Neurological:     Mental Status: He is alert. Mental status is at baseline.   Psychiatric:        Mood and Affect: Mood normal.        Lab Results  Component Value Date   WBC 6.5 12/28/2022   HGB 14.2 12/28/2022   HCT 43.3 12/28/2022   PLT 286.0 12/28/2022   GLUCOSE 119 (H) 12/28/2022   CHOL 118 12/28/2022   TRIG 104.0 12/28/2022   HDL 40.30 12/28/2022   LDLCALC 57 12/28/2022   ALT 18 12/28/2022   AST 23 12/28/2022   NA 137 12/28/2022   K 4.2 12/28/2022   CL 101 12/28/2022   CREATININE 1.27 12/28/2022   BUN 27 (H) 12/28/2022   CO2 27 12/28/2022   TSH 2.39 12/28/2022   PSA 0.34 06/28/2022   INR 1.0 01/30/2017   HGBA1C 7.1 (H) 12/28/2022     Assessment & Plan:    See Problem List for Assessment and Plan of chronic medical problems.

## 2023-07-10 ENCOUNTER — Ambulatory Visit (INDEPENDENT_AMBULATORY_CARE_PROVIDER_SITE_OTHER): Admitting: Internal Medicine

## 2023-07-10 ENCOUNTER — Encounter: Payer: Self-pay | Admitting: Internal Medicine

## 2023-07-10 ENCOUNTER — Ambulatory Visit: Payer: Self-pay | Admitting: Internal Medicine

## 2023-07-10 VITALS — BP 132/70 | HR 76 | Temp 98.0°F | Ht 69.0 in | Wt 193.0 lb

## 2023-07-10 DIAGNOSIS — R0989 Other specified symptoms and signs involving the circulatory and respiratory systems: Secondary | ICD-10-CM | POA: Diagnosis not present

## 2023-07-10 DIAGNOSIS — E118 Type 2 diabetes mellitus with unspecified complications: Secondary | ICD-10-CM

## 2023-07-10 DIAGNOSIS — M5431 Sciatica, right side: Secondary | ICD-10-CM | POA: Diagnosis not present

## 2023-07-10 DIAGNOSIS — Z7984 Long term (current) use of oral hypoglycemic drugs: Secondary | ICD-10-CM | POA: Diagnosis not present

## 2023-07-10 DIAGNOSIS — K219 Gastro-esophageal reflux disease without esophagitis: Secondary | ICD-10-CM

## 2023-07-10 DIAGNOSIS — E039 Hypothyroidism, unspecified: Secondary | ICD-10-CM

## 2023-07-10 DIAGNOSIS — E782 Mixed hyperlipidemia: Secondary | ICD-10-CM | POA: Diagnosis not present

## 2023-07-10 DIAGNOSIS — M9903 Segmental and somatic dysfunction of lumbar region: Secondary | ICD-10-CM | POA: Diagnosis not present

## 2023-07-10 DIAGNOSIS — I10 Essential (primary) hypertension: Secondary | ICD-10-CM

## 2023-07-10 DIAGNOSIS — M159 Polyosteoarthritis, unspecified: Secondary | ICD-10-CM

## 2023-07-10 DIAGNOSIS — M5031 Other cervical disc degeneration,  high cervical region: Secondary | ICD-10-CM | POA: Diagnosis not present

## 2023-07-10 DIAGNOSIS — M109 Gout, unspecified: Secondary | ICD-10-CM | POA: Diagnosis not present

## 2023-07-10 DIAGNOSIS — M9901 Segmental and somatic dysfunction of cervical region: Secondary | ICD-10-CM | POA: Diagnosis not present

## 2023-07-10 LAB — COMPREHENSIVE METABOLIC PANEL WITH GFR
ALT: 16 U/L (ref 0–53)
AST: 17 U/L (ref 0–37)
Albumin: 4.1 g/dL (ref 3.5–5.2)
Alkaline Phosphatase: 74 U/L (ref 39–117)
BUN: 27 mg/dL — ABNORMAL HIGH (ref 6–23)
CO2: 27 meq/L (ref 19–32)
Calcium: 9.3 mg/dL (ref 8.4–10.5)
Chloride: 104 meq/L (ref 96–112)
Creatinine, Ser: 1.34 mg/dL (ref 0.40–1.50)
GFR: 53.62 mL/min — ABNORMAL LOW (ref >60.00)
Glucose, Bld: 141 mg/dL — ABNORMAL HIGH (ref 70–99)
Potassium: 3.9 meq/L (ref 3.5–5.1)
Sodium: 139 meq/L (ref 135–145)
Total Bilirubin: 0.3 mg/dL (ref 0.2–1.2)
Total Protein: 6.8 g/dL (ref 6.0–8.3)

## 2023-07-10 LAB — MICROALBUMIN / CREATININE URINE RATIO
Creatinine,U: 171.2 mg/dL
Microalb Creat Ratio: 991.2 mg/g — ABNORMAL HIGH (ref 0.0–30.0)
Microalb, Ur: 169.7 mg/dL — ABNORMAL HIGH (ref 0.0–1.9)

## 2023-07-10 LAB — LIPID PANEL
Cholesterol: 125 mg/dL (ref 0–200)
HDL: 39.5 mg/dL (ref >39.00)
LDL Cholesterol: 54 mg/dL (ref 0–99)
NonHDL: 85.33
Total CHOL/HDL Ratio: 3
Triglycerides: 158 mg/dL — ABNORMAL HIGH (ref 0.0–149.0)
VLDL: 31.6 mg/dL (ref 0.0–40.0)

## 2023-07-10 LAB — CBC WITH DIFFERENTIAL/PLATELET
Basophils Absolute: 0.1 10*3/uL (ref 0.0–0.1)
Basophils Relative: 0.9 % (ref 0.0–3.0)
Eosinophils Absolute: 0.5 10*3/uL (ref 0.0–0.7)
Eosinophils Relative: 7.2 % — ABNORMAL HIGH (ref 0.0–5.0)
HCT: 39.3 % (ref 39.0–52.0)
Hemoglobin: 13.1 g/dL (ref 13.0–17.0)
Lymphocytes Relative: 18.3 % (ref 12.0–46.0)
Lymphs Abs: 1.4 10*3/uL (ref 0.7–4.0)
MCHC: 33.3 g/dL (ref 30.0–36.0)
MCV: 88 fl (ref 78.0–100.0)
Monocytes Absolute: 0.7 10*3/uL (ref 0.1–1.0)
Monocytes Relative: 8.5 % (ref 3.0–12.0)
Neutro Abs: 5 10*3/uL (ref 1.4–7.7)
Neutrophils Relative %: 65.1 % (ref 43.0–77.0)
Platelets: 262 10*3/uL (ref 150.0–400.0)
RBC: 4.47 Mil/uL (ref 4.22–5.81)
RDW: 14.2 % (ref 11.5–15.5)
WBC: 7.6 10*3/uL (ref 4.0–10.5)

## 2023-07-10 LAB — TSH: TSH: 2.5 u[IU]/mL (ref 0.35–5.50)

## 2023-07-10 LAB — HEMOGLOBIN A1C: Hgb A1c MFr Bld: 7.1 % — ABNORMAL HIGH (ref 4.6–6.5)

## 2023-07-10 MED ORDER — AMLODIPINE BESYLATE-VALSARTAN 5-320 MG PO TABS: 1.0000 | ORAL_TABLET | Freq: Every day | ORAL | 1 refills | Status: DC

## 2023-07-10 MED ORDER — EMPAGLIFLOZIN 25 MG PO TABS: 25.0000 mg | ORAL_TABLET | Freq: Every day | ORAL | 1 refills | Status: DC

## 2023-07-10 NOTE — Assessment & Plan Note (Signed)
 Chronic GERD controlled Continue pantoprazole 40 mg daily

## 2023-07-10 NOTE — Assessment & Plan Note (Signed)
 Chronic Continue meloxicam  15 mg daily as needed-discussed avoiding regular use because of possible side effects Encouraged Tylenol  as needed-would like to keep NSAIDs as minimal as possible

## 2023-07-10 NOTE — Assessment & Plan Note (Addendum)
 Chronic Blood pressure elevated-elevated last time Monitor BP at home Increase valsartan -amlodipine  to 320-5 mg daily

## 2023-07-10 NOTE — Assessment & Plan Note (Signed)
 Chronic  Lab Results  Component Value Date   HGBA1C 7.1 (H) 12/28/2022   Sugars not ideally controlled Testing sugars 1 times a day Check A1c Continue metformin  XR 1000 mg bid, Jardiance  10 mg daily Stressed regular exercise, diabetic diet

## 2023-07-10 NOTE — Assessment & Plan Note (Signed)
Chronic Controlled, Stable Continue allopurinol 100 mg twice daily

## 2023-07-10 NOTE — Assessment & Plan Note (Signed)
Chronic Regular exercise and healthy diet encouraged Check lipid panel  Continue atorvastatin 10 mg daily 

## 2023-07-10 NOTE — Assessment & Plan Note (Signed)
?   Bruit on left  Carotid US  ordered

## 2023-07-10 NOTE — Assessment & Plan Note (Signed)
 Chronic  Clinically euthyroid Check tsh and will titrate med dose if needed Currently taking levothyroxine 112 mcg daily

## 2023-07-12 ENCOUNTER — Ambulatory Visit: Payer: Medicare Other

## 2023-07-16 ENCOUNTER — Other Ambulatory Visit: Payer: Self-pay | Admitting: Internal Medicine

## 2023-07-17 ENCOUNTER — Other Ambulatory Visit: Payer: Self-pay | Admitting: Internal Medicine

## 2023-07-20 ENCOUNTER — Telehealth: Payer: Self-pay | Admitting: Pharmacist

## 2023-07-20 NOTE — Telephone Encounter (Signed)
 Called patient regarding Jardiance  being too high cost. No answer, left voicemail.   Looked up patient's insurance drug coverage. Looks like he has a $255 drug deductible which is the reason it is $281 for the first fill. Once the deductible is satisfied, the monthly cost goes down to $47 per month.   If this is unaffordable for the patient, I will discuss with him regarding PAP criteria for Jardiance  and Farxiga to see if he qualifies for one of them.  Darrelyn Drum, PharmD, BCPS, CPP Clinical Pharmacist Practitioner [facility] [phone]

## 2023-07-23 NOTE — Telephone Encounter (Signed)
 Spoke to patient regarding Jardiance  cost. Explained he has a $255 drug deductible which is the reason it is $281 for the first fill. Once the deductible is satisfied, the monthly cost goes down to $47 per month. Also discussed PAP options. He asked for this information to be messaged to him to discuss with his daughter.  Darrelyn Drum, PharmD, BCPS, CPP Clinical Pharmacist Practitioner [facility] [phone]

## 2023-07-30 DIAGNOSIS — M9901 Segmental and somatic dysfunction of cervical region: Secondary | ICD-10-CM | POA: Diagnosis not present

## 2023-07-30 DIAGNOSIS — M5031 Other cervical disc degeneration,  high cervical region: Secondary | ICD-10-CM | POA: Diagnosis not present

## 2023-07-30 DIAGNOSIS — M5431 Sciatica, right side: Secondary | ICD-10-CM | POA: Diagnosis not present

## 2023-07-30 DIAGNOSIS — M9903 Segmental and somatic dysfunction of lumbar region: Secondary | ICD-10-CM | POA: Diagnosis not present

## 2023-08-06 ENCOUNTER — Other Ambulatory Visit: Payer: Self-pay | Admitting: Internal Medicine

## 2023-08-13 DIAGNOSIS — M5031 Other cervical disc degeneration,  high cervical region: Secondary | ICD-10-CM | POA: Diagnosis not present

## 2023-08-13 DIAGNOSIS — M5431 Sciatica, right side: Secondary | ICD-10-CM | POA: Diagnosis not present

## 2023-08-13 DIAGNOSIS — M9901 Segmental and somatic dysfunction of cervical region: Secondary | ICD-10-CM | POA: Diagnosis not present

## 2023-08-13 DIAGNOSIS — M9903 Segmental and somatic dysfunction of lumbar region: Secondary | ICD-10-CM | POA: Diagnosis not present

## 2023-08-17 ENCOUNTER — Ambulatory Visit

## 2023-08-17 ENCOUNTER — Encounter (HOSPITAL_BASED_OUTPATIENT_CLINIC_OR_DEPARTMENT_OTHER): Payer: Self-pay

## 2023-08-17 VITALS — Ht 69.0 in | Wt 193.0 lb

## 2023-08-17 DIAGNOSIS — Z0000 Encounter for general adult medical examination without abnormal findings: Secondary | ICD-10-CM

## 2023-08-17 NOTE — Patient Instructions (Signed)
 [name] , Thank you for taking time out of your busy schedule to complete your Annual Wellness Visit with me. I enjoyed our conversation and look forward to speaking with you again next year. I, as well as your care team,  appreciate your ongoing commitment to your health goals. Please review the following plan we discussed and let me know if I can assist you in the future. Your Game plan/ To Do List     Follow up Visits: We will see or speak with you next year for your Next Medicare AWV with our clinical staff Have you seen your provider in the last 6 months (3 months if uncontrolled diabetes)? Yes  Clinician Recommendations:  Aim for 30 minutes of exercise or brisk walking, 6-8 glasses of water, and 5 servings of fruits and vegetables each day. Keep up the good work.      This is a list of the screenings recommended for you:  Health Maintenance  Topic Date Due   COVID-19 Vaccine (4 - 2024-25 season) 09/10/2022   Medicare Annual Wellness Visit  07/06/2023   Flu Shot  08/10/2023   Eye exam for diabetics  11/16/2023   Hemoglobin A1C  01/10/2024   Yearly kidney function blood test for diabetes  07/09/2024   Yearly kidney health urinalysis for diabetes  07/09/2024   Complete foot exam   08/09/2024   DTaP/Tdap/Td vaccine (2 - Td or Tdap) 06/28/2027   Colon Cancer Screening  10/21/2028   Pneumococcal Vaccine for age over 47  Completed   Hepatitis C Screening  Completed   Zoster (Shingles) Vaccine  Completed   Hepatitis B Vaccine  Aged Out   HPV Vaccine  Aged Out   Meningitis B Vaccine  Aged Out    Advanced directives: (Declined) Advance directive discussed with you today. Even though you declined this today, please call our office should you change your mind, and we can give you the proper paperwork for you to fill out. Advance Care Planning is important because it:  [x]  Makes sure you receive the medical care that is consistent with your values, goals, and preferences  [x]  It  provides guidance to your family and loved ones and reduces their decisional burden about whether or not they are making the right decisions based on your wishes.  Follow the link provided in your after visit summary or read over the paperwork we have mailed to you to help you started getting your Advance Directives in place. If you need assistance in completing these, please reach out to us  so that we can help you!  See attachments for Preventive Care and Fall Prevention Tips.

## 2023-08-17 NOTE — Progress Notes (Signed)
 Subjective:   [name] is a 73 y.o. who presents for a Medicare Wellness preventive visit.  As a reminder, Annual Wellness Visits don't include a physical exam, and some assessments may be limited, especially if this visit is performed virtually. We may recommend an in-person follow-up visit with your provider if needed.  Visit Complete: Virtual I connected with  [name] on 08/17/23 by a audio enabled telemedicine application and verified that I am speaking with the correct person using two identifiers.  Patient Location: Home  Provider Location: Office/Clinic  I discussed the limitations of evaluation and management by telemedicine. The patient expressed understanding and agreed to proceed.  Vital Signs: Because this visit was a virtual/telehealth visit, some criteria may be missing or patient reported. Any vitals not documented were not able to be obtained and vitals that have been documented are patient reported.  VideoDeclined- This patient declined Librarian, academic. Therefore the visit was completed with audio only.  Persons Participating in Visit: Patient.  AWV Questionnaire: Yes: Patient Medicare AWV questionnaire was completed by the patient on 08/16/2023; I have confirmed that all information answered by patient is correct and no changes since this date.  NO CHANGES FOR ALCOHOL SCREEN from 07/06/2023-Per Pt  Cardiac Risk Factors include: advanced age (>51men, >60 women);hypertension;male gender;Other (see comment);diabetes mellitus;dyslipidemia, Risk factor comments: OSA, Aortic atherosclerosis     Objective:    Today's Vitals   08/17/23 0843  Weight: 193 lb (87.5 kg)  Height: 5' 9 (1.753 m)   Body mass index is 28.5 kg/m.     08/17/2023    8:53 AM 07/06/2022    9:48 AM 07/04/2021    1:19 PM 06/24/2019    8:32 AM 04/05/2016    5:46 PM 04/05/2016   11:15 AM 03/24/2016    3:00 PM  Advanced Directives  Does Patient Have a  Medical Advance Directive? No No No Yes Yes  Yes  No   Type of Merchandiser, retail of [place];Living will Healthcare Power of [place];Living will   Does patient want to make changes to medical advance directive?    No - Patient declined No - Patient declined     Copy of Healthcare Power of Attorney in Chart?     No - copy requested  Yes    Would patient like information on creating a medical advance directive?  No - Patient declined No - Patient declined  No - Patient declined  Yes (MAU/Ambulatory/Procedural Areas - Information given)  Yes (MAU/Ambulatory/Procedural Areas - Information given)      Data saved with a previous flowsheet row definition    Current Medications (verified) Outpatient Encounter Medications as of 08/17/2023  Medication Sig   allopurinol  (ZYLOPRIM ) 100 MG tablet Take 1 tablet by mouth twice daily   amLODipine -valsartan  (EXFORGE ) 5-320 MG tablet Take 1 tablet by mouth daily.   Ascorbic Acid (VITAMIN C WITH ROSE HIPS) 500 MG tablet Take 1,000 mg by mouth daily.   atorvastatin  (LIPITOR) 10 MG tablet Take 1 tablet by mouth once daily   Azelastine  HCl 0.15 % SOLN Place 1-2 sprays into both nostrils 2 (two) times daily as needed.   B Complex-C (SUPER B COMPLEX PO) Take 1 tablet by mouth daily.   Calcium  Carbonate-Vitamin D (CALCIUM -VITAMIN D3 PO) Take 1 tablet by mouth every other day.   Cholecalciferol (VITAMIN D3) 1000 units CAPS Take 1,000 Units by mouth daily.   empagliflozin  (JARDIANCE ) 25 MG TABS  tablet Take 1 tablet (25 mg total) by mouth daily before breakfast.   Garlic 1000 MG CAPS Take 1,000 mg by mouth every other day.   glucosamine-chondroitin 500-400 MG tablet Take 2 tablets by mouth daily.   levocetirizine (XYZAL) 5 MG tablet Take 10 mg by mouth every evening.   levothyroxine  (SYNTHROID ) 112 MCG tablet Take 1 tablet by mouth once daily   Magnesium 100 MG TABS Take 100 mg by mouth every other day.   meloxicam  (MOBIC ) 15 MG tablet TAKE 1  TABLET BY MOUTH ONCE DAILY AS NEEDED WITH FOOD FOR PAIN   metFORMIN  (GLUCOPHAGE -XR) 500 MG 24 hr tablet TAKE 2 TABLETS BY MOUTH TWICE DAILY WITH A MEAL   montelukast  (SINGULAIR ) 10 MG tablet Take 1 tablet (10 mg total) by mouth at bedtime.   Multiple Vitamins-Minerals (MULTIVITAMIN ADULTS 50+ PO) Take 1 tablet by mouth daily.   pantoprazole  (PROTONIX ) 40 MG tablet Take 1 tablet by mouth once daily   vitamin E 400 UNIT capsule Take 400 Units by mouth every other day.   Zinc 50 MG TABS Take by mouth.   [DISCONTINUED] Azelastine -Fluticasone  137-50 MCG/ACT SUSP Place 1 spray into the nose 2 (two) times daily as needed. (Patient not taking: Reported on 01/22/2023)   [DISCONTINUED] fluticasone  (FLONASE ) 50 MCG/ACT nasal spray Place 2 sprays into both nostrils daily. (Patient not taking: Reported on 09/16/2019)   No facility-administered encounter medications on file as of 08/17/2023.    Allergies (verified) Morphine   History: Past Medical History:  Diagnosis Date   Allergy    Arthritis    Diabetes mellitus without complication (HCC)    Diverticulitis of colon    GERD (gastroesophageal reflux disease)    Glaucoma    Gout    Headache    Hypertension    Hypothyroidism    Thyroid  disease    URI (upper respiratory infection) 11/26/2017   Past Surgical History:  Procedure Laterality Date   ANKLE FRACTURE SURGERY     ANTERIOR CERVICAL DECOMP/DISCECTOMY FUSION N/A 04/05/2016   Procedure: C5-6, C6-7 Anterior Cervical Discectomy and Fusion, Allograft, Plate;  Surgeon: Oneil JAYSON Herald, MD;  Location: [facility] OR;  Service: Orthopedics;  Laterality: N/A;   ANTERIOR FUSION CERVICAL SPINE  04/05/2016   c6 c7   CARPAL TUNNEL RELEASE Bilateral    CATARACT EXTRACTION Bilateral    2025   JOINT REPLACEMENT  01/2005   left knee replaced    JOINT REPLACEMENT  11/2008   right knee replaced    KNEE SURGERY     LEFT HEART CATH AND CORONARY ANGIOGRAPHY N/A 02/06/2017   Procedure: LEFT HEART CATH AND CORONARY  ANGIOGRAPHY;  Surgeon: Claudene Victory ORN, MD;  Location: [facility] INVASIVE [facility];  Service: Cardiovascular;  Laterality: N/A;   LEG SURGERY  11/2001   broken lower right leg crushed    Family History  Problem Relation Age of Onset   Hypertension Mother    Diabetes Mother    Heart attack Father    Heart disease Father    Diabetes Paternal Grandmother    Cancer Paternal Grandfather        of eye   Colon cancer Neg Hx    Stomach cancer Neg Hx    Esophageal cancer Neg Hx    Social History   Socioeconomic History   Marital status: Divorced    Spouse name: Not on file   Number of children: Not on file   Years of education: Not on file   Highest education level:  Associate degree: occupational, technical, or vocational program  Occupational History   Occupation: [employer] RETIRED  Tobacco Use   Smoking status: Former    Current packs/day: 0.00    Types: Cigarettes    Quit date: 01/10/2008    Years since quitting: 15.6   Smokeless tobacco: Former    Types: Associate Professor status: Never Used  Substance and Sexual Activity   Alcohol use: Yes    Comment: occasionally   Drug use: No   Sexual activity: Yes    Birth control/protection: None    Comment: Married  Other Topics Concern   Not on file  Social History Narrative   Patient's daughter lives with him/2025   Social Drivers of Health   Financial Resource Strain: Patient Declined (08/17/2023)   Overall Financial Resource Strain (CARDIA)    Difficulty of Paying Living Expenses: Patient declined  Food Insecurity: Patient Declined (08/17/2023)   Hunger Vital Sign    Worried About Running Out of Food in the Last Year: Patient declined    Ran Out of Food in the Last Year: Patient declined  Transportation Needs: No Transportation Needs (08/17/2023)   PRAPARE - Administrator, Civil Service (Medical): No    Lack of Transportation (Non-Medical): No  Physical Activity: Insufficiently Active (08/17/2023)   Exercise Vital Sign     Days of Exercise per Week: 2 days    Minutes of Exercise per Session: 30 min  Stress: No Stress Concern Present (08/17/2023)   [employer] of Occupational Health - Occupational Stress Questionnaire    Feeling of Stress: Not at all  Social Connections: Moderately Integrated (08/17/2023)   Social Connection and Isolation Panel    Frequency of Communication with Friends and Family: More than three times a week    Frequency of Social Gatherings with Friends and Family: Three times a week    Attends Religious Services: More than 4 times per year    Active Member of Clubs or Organizations: Yes    Attends Engineer, structural: More than 4 times per year    Marital Status: Divorced    Tobacco Counseling Counseling given: Not Answered    Clinical Intake:  Pre-visit preparation completed: Yes  Pain : No/denies pain     BMI - recorded: 28.5 Nutritional Status: BMI 25 -29 Overweight Nutritional Risks: None Diabetes: Yes CBG done?: No Did pt. bring in CBG monitor from home?: No  Lab Results  Component Value Date   HGBA1C 7.1 (H) 07/10/2023   HGBA1C 7.1 (H) 12/28/2022   HGBA1C 6.7 (H) 06/28/2022     How often do you need to have someone help you when you read instructions, pamphlets, or other written materials from your doctor or pharmacy?: 1 - Never  Interpreter Needed?: No  Information entered by :: Avry Roedl, RMA   Activities of Daily Living     08/16/2023    6:35 PM  In your present state of health, do you have any difficulty performing the following activities:  Hearing? 0  Vision? 0  Difficulty concentrating or making decisions? 0  Walking or climbing stairs? 0  Dressing or bathing? 0  Doing errands, shopping? 0  Preparing Food and eating ? N  Using the Toilet? N  In the past six months, have you accidently leaked urine? N  Do you have problems with loss of bowel control? N  Managing your Medications? N  Managing your Finances? N   Housekeeping or managing your  Housekeeping? N    Patient Care Team: Geofm Glade PARAS, MD as PCP - General (Internal Medicine) Claudene Victory ORN, MD (Inactive) as PCP - Cardiology (Cardiology) Delories Lukes, [place] as Consulting Physician (Optometry) Camillo Golas, MD as Attending Physician (Ophthalmology)  I have updated your Care Teams any recent Medical Services you may have received from other providers in the past year.     Assessment:   This is a routine wellness examination for Daryel.  Hearing/Vision screen Hearing Screening - Comments:: Denies hearing difficulties   Vision Screening - Comments:: Wears eyeglasses/[facility]   Goals Addressed             This Visit's Progress    My healthcare goal for 2024 is to stay alive and maintain my health.   On track      Depression Screen     08/17/2023    8:55 AM 07/10/2023    2:35 PM 12/28/2022    9:31 AM 07/06/2022   10:01 AM 06/28/2022    2:02 PM 12/27/2021    8:16 AM 07/04/2021    1:20 PM  PHQ 2/9 Scores  PHQ - 2 Score 0 0 0 0 0 0 0  PHQ- 9 Score 0 0 0 0  0     Fall Risk     08/16/2023    6:35 PM 07/10/2023    2:34 PM 12/28/2022    9:31 AM 07/06/2022    9:49 AM 06/28/2022    2:01 PM  Fall Risk   Falls in the past year? 0 0 0 0 0  Number falls in past yr: 0 0 0 0 0  Injury with Fall? 0 0 0 0 0  Risk for fall due to :  No Fall Risks No Fall Risks No Fall Risks No Fall Risks  Follow up Falls evaluation completed;Falls prevention discussed Falls evaluation completed Falls evaluation completed Falls prevention discussed Falls evaluation completed    MEDICARE RISK AT HOME:  Medicare Risk at Home Any stairs in or around the home?: (Patient-Rptd) No If so, are there any without handrails?: (Patient-Rptd) No Home free of loose throw rugs in walkways, pet beds, electrical cords, etc?: (Patient-Rptd) Yes Adequate lighting in your home to reduce risk of falls?: (Patient-Rptd) Yes Life alert?: (Patient-Rptd)  No Use of a cane, walker or w/c?: (Patient-Rptd) No Grab bars in the bathroom?: (Patient-Rptd) No Shower chair or bench in shower?: (Patient-Rptd) No Elevated toilet seat or a handicapped toilet?: (Patient-Rptd) No  TIMED UP AND GO:  Was the test performed?  No  Cognitive Function: Declined/Normal: No cognitive concerns noted by patient or family. Patient alert, oriented, able to answer questions appropriately and recall recent events. No signs of memory loss or confusion.        07/06/2022    9:53 AM  6CIT Screen  What Year? 0 points  What month? 0 points  What time? 0 points  Count back from 20 0 points  Months in reverse 0 points  Repeat phrase 0 points  Total Score 0 points    Immunizations Immunization History  Administered Date(s) Administered   Fluad Quad(high Dose 65+) 10/28/2021   Influenza, High Dose Seasonal PF 10/12/2017, 11/06/2019   Influenza,inj,Quad PF,6+ Mos 10/11/2018   Influenza-Unspecified 11/16/2020, 10/18/2022   PFIZER(Purple Top)SARS-COV-2 Vaccination 05/06/2019, 05/26/2019, 12/25/2019   Pneumococcal Conjugate-13 12/24/2018   Pneumococcal Polysaccharide-23 11/12/2017   Tdap 06/27/2017   Zoster Recombinant(Shingrix ) 12/27/2017, 03/28/2018    Screening Tests Health Maintenance  Topic Date Due  COVID-19 Vaccine (4 - 2024-25 season) 09/10/2022   Medicare Annual Wellness (AWV)  07/06/2023   INFLUENZA VACCINE  08/10/2023   OPHTHALMOLOGY EXAM  11/16/2023   HEMOGLOBIN A1C  01/10/2024   Diabetic kidney evaluation - eGFR measurement  07/09/2024   Diabetic kidney evaluation - Urine ACR  07/09/2024   FOOT EXAM  08/09/2024   DTaP/Tdap/Td (2 - Td or Tdap) 06/28/2027   Colonoscopy  10/21/2028   Pneumococcal Vaccine: 50+ Years  Completed   Hepatitis C Screening  Completed   Zoster Vaccines- Shingrix   Completed   Hepatitis B Vaccines  Aged Out   HPV VACCINES  Aged Out   Meningococcal B Vaccine  Aged Out    Health Maintenance  Health Maintenance Due   Topic Date Due   COVID-19 Vaccine (4 - 2024-25 season) 09/10/2022   Medicare Annual Wellness (AWV)  07/06/2023   INFLUENZA VACCINE  08/10/2023   Health Maintenance Items Addressed: See Nurse Notes at the end of this note  Additional Screening:  Vision Screening: Recommended annual ophthalmology exams for early detection of glaucoma and other disorders of the eye. Would you like a referral to an eye doctor? No    Dental Screening: Recommended annual dental exams for proper oral hygiene  Community Resource Referral / Chronic Care Management: CRR required this visit?  No   CCM required this visit?  No   Plan:    I have personally reviewed and noted the following in the patient's chart:   Medical and social history Use of alcohol, tobacco or illicit drugs  Current medications and supplements including opioid prescriptions. Patient is not currently taking opioid prescriptions. Functional ability and status Nutritional status Physical activity Advanced directives List of other physicians Hospitalizations, surgeries, and ER visits in previous 12 months Vitals Screenings to include cognitive, depression, and falls Referrals and appointments  In addition, I have reviewed and discussed with patient certain preventive protocols, quality metrics, and best practice recommendations. A written personalized care plan for preventive services as well as general preventive health recommendations were provided to patient.   Jishnu Jenniges L Maddalyn Lutze, CMA   08/17/2023   After Visit Summary: (MyChart) Due to this being a telephonic visit, the after visit summary with patients personalized plan was offered to patient via MyChart   Notes: Nothing significant to report at this time.

## 2023-08-20 ENCOUNTER — Ambulatory Visit (HOSPITAL_BASED_OUTPATIENT_CLINIC_OR_DEPARTMENT_OTHER)

## 2023-08-20 DIAGNOSIS — R0989 Other specified symptoms and signs involving the circulatory and respiratory systems: Secondary | ICD-10-CM

## 2023-08-22 ENCOUNTER — Other Ambulatory Visit: Payer: Self-pay | Admitting: Internal Medicine

## 2023-08-23 ENCOUNTER — Encounter: Payer: Self-pay | Admitting: Internal Medicine

## 2023-08-23 DIAGNOSIS — I779 Disorder of arteries and arterioles, unspecified: Secondary | ICD-10-CM | POA: Insufficient documentation

## 2023-08-27 DIAGNOSIS — M9903 Segmental and somatic dysfunction of lumbar region: Secondary | ICD-10-CM | POA: Diagnosis not present

## 2023-08-27 DIAGNOSIS — M5431 Sciatica, right side: Secondary | ICD-10-CM | POA: Diagnosis not present

## 2023-08-27 DIAGNOSIS — M5031 Other cervical disc degeneration,  high cervical region: Secondary | ICD-10-CM | POA: Diagnosis not present

## 2023-08-27 DIAGNOSIS — M9901 Segmental and somatic dysfunction of cervical region: Secondary | ICD-10-CM | POA: Diagnosis not present

## 2023-08-30 ENCOUNTER — Other Ambulatory Visit: Payer: Self-pay | Admitting: Internal Medicine

## 2023-09-11 DIAGNOSIS — M9903 Segmental and somatic dysfunction of lumbar region: Secondary | ICD-10-CM | POA: Diagnosis not present

## 2023-09-11 DIAGNOSIS — M5031 Other cervical disc degeneration,  high cervical region: Secondary | ICD-10-CM | POA: Diagnosis not present

## 2023-09-11 DIAGNOSIS — M5431 Sciatica, right side: Secondary | ICD-10-CM | POA: Diagnosis not present

## 2023-09-11 DIAGNOSIS — M9901 Segmental and somatic dysfunction of cervical region: Secondary | ICD-10-CM | POA: Diagnosis not present

## 2023-09-21 ENCOUNTER — Encounter: Payer: Self-pay | Admitting: Internal Medicine

## 2023-09-24 DIAGNOSIS — M9903 Segmental and somatic dysfunction of lumbar region: Secondary | ICD-10-CM | POA: Diagnosis not present

## 2023-09-24 DIAGNOSIS — M5431 Sciatica, right side: Secondary | ICD-10-CM | POA: Diagnosis not present

## 2023-09-24 DIAGNOSIS — M5031 Other cervical disc degeneration,  high cervical region: Secondary | ICD-10-CM | POA: Diagnosis not present

## 2023-09-24 DIAGNOSIS — M9901 Segmental and somatic dysfunction of cervical region: Secondary | ICD-10-CM | POA: Diagnosis not present

## 2023-10-08 DIAGNOSIS — M9901 Segmental and somatic dysfunction of cervical region: Secondary | ICD-10-CM | POA: Diagnosis not present

## 2023-10-08 DIAGNOSIS — M5431 Sciatica, right side: Secondary | ICD-10-CM | POA: Diagnosis not present

## 2023-10-08 DIAGNOSIS — M9903 Segmental and somatic dysfunction of lumbar region: Secondary | ICD-10-CM | POA: Diagnosis not present

## 2023-10-08 DIAGNOSIS — M5031 Other cervical disc degeneration,  high cervical region: Secondary | ICD-10-CM | POA: Diagnosis not present

## 2023-10-15 ENCOUNTER — Other Ambulatory Visit: Payer: Self-pay | Admitting: Internal Medicine

## 2023-10-21 ENCOUNTER — Encounter: Payer: Self-pay | Admitting: Internal Medicine

## 2023-10-21 DIAGNOSIS — N1831 Chronic kidney disease, stage 3a: Secondary | ICD-10-CM | POA: Insufficient documentation

## 2023-10-21 NOTE — Progress Notes (Unsigned)
 Subjective:    Patient ID: [name], male    DOB: 01-28-1950, 73 y.o.   MRN: [id number]     HPI Muhamed is here for follow up of his chronic medical problems.   Went to [facility] and BP was 140/90.  Taking  meloxicam  rarely  Has left shoulder pain and decreased ROM.     Medications and allergies reviewed with patient and updated if appropriate.  Current Outpatient Medications on File Prior to Visit  Medication Sig Dispense Refill   allopurinol  (ZYLOPRIM ) 100 MG tablet Take 1 tablet by mouth twice daily 180 tablet 0   amLODipine -valsartan  (EXFORGE ) 5-320 MG tablet Take 1 tablet by mouth daily. 90 tablet 1   Ascorbic Acid (VITAMIN C WITH ROSE HIPS) 500 MG tablet Take 1,000 mg by mouth daily.     atorvastatin  (LIPITOR) 10 MG tablet Take 1 tablet by mouth once daily 90 tablet 0   Azelastine  HCl 0.15 % SOLN Place 1-2 sprays into both nostrils 2 (two) times daily as needed. 30 mL 5   B Complex-C (SUPER B COMPLEX PO) Take 1 tablet by mouth daily.     Calcium  Carbonate-Vitamin D (CALCIUM -VITAMIN D3 PO) Take 1 tablet by mouth every other day.     Cholecalciferol (VITAMIN D3) 1000 units CAPS Take 1,000 Units by mouth daily.     empagliflozin  (JARDIANCE ) 25 MG TABS tablet Take 1 tablet (25 mg total) by mouth daily before breakfast. 90 tablet 1   Garlic 1000 MG CAPS Take 1,000 mg by mouth every other day.     glucosamine-chondroitin 500-400 MG tablet Take 2 tablets by mouth daily.     levocetirizine (XYZAL) 5 MG tablet Take 10 mg by mouth every evening.     levothyroxine  (SYNTHROID ) 112 MCG tablet Take 1 tablet by mouth once daily 90 tablet 0   Magnesium 100 MG TABS Take 100 mg by mouth every other day.     meloxicam  (MOBIC ) 15 MG tablet TAKE 1 TABLET BY MOUTH ONCE DAILY AS NEEDED WITH FOOD FOR PAIN 90 tablet 0   metFORMIN  (GLUCOPHAGE -XR) 500 MG 24 hr tablet TAKE 2 TABLETS BY MOUTH TWICE DAILY WITH A MEAL 360 tablet 0   montelukast  (SINGULAIR ) 10 MG tablet Take 1 tablet (10 mg  total) by mouth at bedtime. 30 tablet 5   Multiple Vitamins-Minerals (MULTIVITAMIN ADULTS 50+ PO) Take 1 tablet by mouth daily.     pantoprazole  (PROTONIX ) 40 MG tablet Take 1 tablet by mouth once daily 90 tablet 0   vitamin E 400 UNIT capsule Take 400 Units by mouth every other day.     Zinc 50 MG TABS Take by mouth.     [DISCONTINUED] Azelastine -Fluticasone  137-50 MCG/ACT SUSP Place 1 spray into the nose 2 (two) times daily as needed. (Patient not taking: Reported on 01/22/2023) 23 g 2   [DISCONTINUED] fluticasone  (FLONASE ) 50 MCG/ACT nasal spray Place 2 sprays into both nostrils daily. (Patient not taking: Reported on 09/16/2019) 1 g 5   No current facility-administered medications on file prior to visit.     Review of Systems  Respiratory:  Negative for shortness of breath.   Cardiovascular:  Negative for chest pain, palpitations and leg swelling.  Neurological:  Positive for headaches (occ - nothing new). Negative for dizziness and light-headedness.       Objective:   Vitals:   10/22/23 0755 10/22/23 0759  BP: (!) 142/78 (!) 140/82  Pulse: 80   Temp: 98 F (36.7 C)  SpO2: 95%    BP Readings from Last 3 Encounters:  10/22/23 (!) 140/82  07/10/23 132/70  02/20/23 (!) 140/70   Wt Readings from Last 3 Encounters:  10/22/23 189 lb (85.7 kg)  08/17/23 193 lb (87.5 kg)  07/10/23 193 lb (87.5 kg)   Body mass index is 27.91 kg/m.    Physical Exam Constitutional:      General: He is not in acute distress.    Appearance: Normal appearance. He is not ill-appearing.  HENT:     Head: Normocephalic and atraumatic.  Eyes:     Conjunctiva/sclera: Conjunctivae normal.  Cardiovascular:     Rate and Rhythm: Normal rate and regular rhythm.     Heart sounds: Normal heart sounds.  Pulmonary:     Effort: Pulmonary effort is normal. No respiratory distress.     Breath sounds: Normal breath sounds. No wheezing or rales.  Musculoskeletal:     Right lower leg: No edema.     Left  lower leg: No edema.  Skin:    General: Skin is warm and dry.     Findings: No rash.  Neurological:     Mental Status: He is alert. Mental status is at baseline.  Psychiatric:        Mood and Affect: Mood normal.        Lab Results  Component Value Date   WBC 7.6 07/10/2023   HGB 13.1 07/10/2023   HCT 39.3 07/10/2023   PLT 262.0 07/10/2023   GLUCOSE 141 (H) 07/10/2023   CHOL 125 07/10/2023   TRIG 158.0 (H) 07/10/2023   HDL 39.50 07/10/2023   LDLCALC 54 07/10/2023   ALT 16 07/10/2023   AST 17 07/10/2023   NA 139 07/10/2023   K 3.9 07/10/2023   CL 104 07/10/2023   CREATININE 1.34 07/10/2023   BUN 27 (H) 07/10/2023   CO2 27 07/10/2023   TSH 2.50 07/10/2023   PSA 0.34 06/28/2022   INR 1.0 01/30/2017   HGBA1C 7.1 (H) 07/10/2023   MICROALBUR 169.7 (H) 07/10/2023   EKG - NSR @ 61 bpm, nonspecific T wave abnormality.  No change compared to EKG from 2019.    Assessment & Plan:    See Problem List for Assessment and Plan of chronic medical problems.

## 2023-10-21 NOTE — Patient Instructions (Addendum)
      Blood work was ordered.       Medications changes include :   None    A referral was ordered and someone will call you to schedule an appointment.     Return in about 6 months (around 04/21/2024) for Physical Exam.

## 2023-10-22 ENCOUNTER — Other Ambulatory Visit: Payer: Self-pay | Admitting: Internal Medicine

## 2023-10-22 ENCOUNTER — Ambulatory Visit (INDEPENDENT_AMBULATORY_CARE_PROVIDER_SITE_OTHER): Admitting: Internal Medicine

## 2023-10-22 VITALS — BP 136/74 | HR 80 | Temp 98.0°F | Ht 69.0 in | Wt 189.0 lb

## 2023-10-22 DIAGNOSIS — M5031 Other cervical disc degeneration,  high cervical region: Secondary | ICD-10-CM | POA: Diagnosis not present

## 2023-10-22 DIAGNOSIS — I2510 Atherosclerotic heart disease of native coronary artery without angina pectoris: Secondary | ICD-10-CM

## 2023-10-22 DIAGNOSIS — M9903 Segmental and somatic dysfunction of lumbar region: Secondary | ICD-10-CM | POA: Diagnosis not present

## 2023-10-22 DIAGNOSIS — N1831 Chronic kidney disease, stage 3a: Secondary | ICD-10-CM | POA: Diagnosis not present

## 2023-10-22 DIAGNOSIS — K219 Gastro-esophageal reflux disease without esophagitis: Secondary | ICD-10-CM

## 2023-10-22 DIAGNOSIS — E785 Hyperlipidemia, unspecified: Secondary | ICD-10-CM

## 2023-10-22 DIAGNOSIS — M159 Polyosteoarthritis, unspecified: Secondary | ICD-10-CM

## 2023-10-22 DIAGNOSIS — M109 Gout, unspecified: Secondary | ICD-10-CM | POA: Diagnosis not present

## 2023-10-22 DIAGNOSIS — E1122 Type 2 diabetes mellitus with diabetic chronic kidney disease: Secondary | ICD-10-CM | POA: Diagnosis not present

## 2023-10-22 DIAGNOSIS — E039 Hypothyroidism, unspecified: Secondary | ICD-10-CM

## 2023-10-22 DIAGNOSIS — G8929 Other chronic pain: Secondary | ICD-10-CM

## 2023-10-22 DIAGNOSIS — M25512 Pain in left shoulder: Secondary | ICD-10-CM

## 2023-10-22 DIAGNOSIS — M5431 Sciatica, right side: Secondary | ICD-10-CM | POA: Diagnosis not present

## 2023-10-22 DIAGNOSIS — E1169 Type 2 diabetes mellitus with other specified complication: Secondary | ICD-10-CM | POA: Diagnosis not present

## 2023-10-22 DIAGNOSIS — E1159 Type 2 diabetes mellitus with other circulatory complications: Secondary | ICD-10-CM | POA: Diagnosis not present

## 2023-10-22 DIAGNOSIS — M9901 Segmental and somatic dysfunction of cervical region: Secondary | ICD-10-CM | POA: Diagnosis not present

## 2023-10-22 DIAGNOSIS — I152 Hypertension secondary to endocrine disorders: Secondary | ICD-10-CM | POA: Diagnosis not present

## 2023-10-22 LAB — COMPREHENSIVE METABOLIC PANEL WITH GFR
ALT: 18 U/L (ref 0–53)
AST: 20 U/L (ref 0–37)
Albumin: 4.4 g/dL (ref 3.5–5.2)
Alkaline Phosphatase: 72 U/L (ref 39–117)
BUN: 19 mg/dL (ref 6–23)
CO2: 29 meq/L (ref 19–32)
Calcium: 9.7 mg/dL (ref 8.4–10.5)
Chloride: 103 meq/L (ref 96–112)
Creatinine, Ser: 1.24 mg/dL (ref 0.40–1.50)
GFR: 58.73 mL/min — ABNORMAL LOW (ref >60.00)
Glucose, Bld: 122 mg/dL — ABNORMAL HIGH (ref 70–99)
Potassium: 4.5 meq/L (ref 3.5–5.1)
Sodium: 140 meq/L (ref 135–145)
Total Bilirubin: 0.5 mg/dL (ref 0.2–1.2)
Total Protein: 7.1 g/dL (ref 6.0–8.3)

## 2023-10-22 LAB — HEMOGLOBIN A1C: Hgb A1c MFr Bld: 6.7 % — ABNORMAL HIGH (ref 4.6–6.5)

## 2023-10-22 MED ORDER — AMLODIPINE BESYLATE-VALSARTAN 10-320 MG PO TABS: 1.0000 | ORAL_TABLET | Freq: Every day | ORAL | Status: DC

## 2023-10-22 MED ORDER — AMLODIPINE BESYLATE 5 MG PO TABS: 5.0000 mg | ORAL_TABLET | Freq: Every day | ORAL | 0 refills | Status: DC

## 2023-10-22 NOTE — Assessment & Plan Note (Signed)
Chronic Regular exercise and healthy diet encouraged LDL well controlled Continue atorvastatin 10 mg daily

## 2023-10-22 NOTE — Assessment & Plan Note (Signed)
 Chronic Occ flares Continue allopurinol  100 mg twice daily as needed

## 2023-10-22 NOTE — Assessment & Plan Note (Signed)
 Chronic GERD controlled Continue pantoprazole 40 mg daily

## 2023-10-22 NOTE — Assessment & Plan Note (Signed)
 Chronic  Clinically euthyroid Last TSH normal range Continue levothyroxine  112 mcg daily

## 2023-10-22 NOTE — Assessment & Plan Note (Addendum)
 Chronic Stage IIIa-stable Stressed good blood pressure, sugar control Diet low in sodium, sugar encouraged CMP

## 2023-10-22 NOTE — Assessment & Plan Note (Addendum)
 Chronic Has been taking meloxicam  15 mg daily as needed-takes rarely Encouraged Tylenol  as needed-would like to keep NSAIDs as minimal as possible

## 2023-10-22 NOTE — Assessment & Plan Note (Signed)
 Chronic Associated with chronic kidney disease 3a  Lab Results  Component Value Date   HGBA1C 7.1 (H) 07/10/2023   Sugars not ideally controlled Testing sugars 1 times a day Check A1c Continue metformin  XR 1000 mg bid, Jardiance  25 mg daily Stressed regular exercise, diabetic diet

## 2023-10-22 NOTE — Assessment & Plan Note (Addendum)
 Chronic Blood pressure elevated Monitor BP at home Increase amlodipine -valsartan  from  5-320 mg daily to 10-320 mg daily EKG today

## 2023-10-22 NOTE — Assessment & Plan Note (Signed)
Chronic Continue atorvastatin 10 mg daily  LDL at goal Healthy diet and increased activity encouraged

## 2023-10-24 ENCOUNTER — Ambulatory Visit: Payer: Self-pay | Admitting: Internal Medicine

## 2023-11-06 ENCOUNTER — Other Ambulatory Visit: Payer: Self-pay | Admitting: Internal Medicine

## 2023-11-12 ENCOUNTER — Other Ambulatory Visit: Payer: Self-pay

## 2023-11-12 ENCOUNTER — Encounter: Payer: Self-pay | Admitting: Radiology

## 2023-11-12 ENCOUNTER — Encounter: Payer: Self-pay | Admitting: Orthopedic Surgery

## 2023-11-12 ENCOUNTER — Ambulatory Visit: Admitting: Orthopedic Surgery

## 2023-11-12 DIAGNOSIS — M25512 Pain in left shoulder: Secondary | ICD-10-CM | POA: Diagnosis not present

## 2023-11-12 NOTE — Progress Notes (Signed)
 Office Visit Note   Patient: [name]           Date of Birth: February 28, 1950           MRN: [id number] Visit Date: 11/12/2023              Requested by: Geofm Glade PARAS, MD [address] [place],  [place] [address] PCP: Geofm Glade PARAS, MD  Chief Complaint  Patient presents with   Left Shoulder - Pain      HPI: Discussed the use of AI scribe software for clinical note transcription with the patient, who gave verbal consent to proceed.  History of Present Illness [name] is a 73 year old male with end-stage arthritis and rotator cuff rupture who presents with left shoulder pain.  He has experienced left shoulder pain for over two years, described as 'bone on bone', which worsens with certain movements. The pain prevents him from sleeping on his left side and he must keep his arm down beside him. He can no longer sleep with his arm under the pillow as he used to. Additionally, he is unable to hold his friend's son, who weighs approximately 30-35 pounds, in his left arm.  He has been using Biofreeze and ice therapy to manage the pain.  His work history includes over 35 years with [employer].  His past medical history includes internal fixation for his cervical spine due to effusion and multiple surgeries on his leg, ankle, knee, neck, and hand.     Assessment & Plan: Visit Diagnoses:  1. 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 Radiographs show bone on bone contact, superior migration of the humeral head, and articulation across the acromion. Conservative management insufficient. Surgical intervention indicated. Recommended reverse total shoulder arthroplasty for improved stability and function. - Referred to Dr. Glendia Hutchinson for evaluation  and potential reverse total shoulder arthroplasty.       Follow-Up Instructions: No follow-ups on file.   Ortho Exam  Patient is alert, oriented, no adenopathy, well-dressed, normal affect, normal respiratory effort. Physical Exam MUSCULOSKELETAL: Left shoulder forward flexion and abduction of the glenohumeral joint is limited to about 45 degrees. Pain is elicited with Neer and Hawkins impingement tests on the left shoulder.      Imaging: No results found. No images are attached to the encounter.  Labs: Lab Results  Component Value Date   HGBA1C 6.7 (H) 10/22/2023   HGBA1C 7.1 (H) 07/10/2023   HGBA1C 7.1 (H) 12/28/2022   ESRSEDRATE 6 04/03/2017   LABURIC 5.4 06/28/2022   LABURIC 7.5 12/27/2020   LABURIC 7.3 12/24/2018   REPTSTATUS 09/16/2010 FINAL 09/15/2010   CULT NO GROWTH 09/15/2010     Lab Results  Component Value Date   ALBUMIN 4.4 10/22/2023   ALBUMIN 4.1 07/10/2023   ALBUMIN 4.3 12/28/2022    No results found for: MG No results found for: VD25OH  No results found for: PREALBUMIN    Latest Ref Rng & Units 07/10/2023    3:17 PM 12/28/2022    8:42 AM 12/27/2021    8:43 AM  CBC EXTENDED  WBC 4.0 - 10.5 K/uL 7.6  6.5  4.8   RBC 4.22 - 5.81 Mil/uL 4.47  4.66  4.85   Hemoglobin 13.0 - 17.0 g/dL 86.8  85.7  14.2   HCT 39.0 - 52.0 % 39.3  43.3  43.1   Platelets 150.0 - 400.0 K/uL 262.0  286.0  266.0   NEUT# 1.4 - 7.7 K/uL 5.0  3.3  2.4   Lymph# 0.7 - 4.0 K/uL 1.4  1.5  1.2      There is no height or weight on file to calculate BMI.  Orders:  Orders Placed This Encounter  Procedures   XR Shoulder Left   No orders of the defined types were placed in this encounter.    Procedures: No procedures performed  Clinical Data: No additional findings.  ROS:  All other systems negative, except as noted in the HPI. Review of Systems  Objective: Vital Signs: There were no vitals taken for this visit.  Specialty Comments:  No specialty  comments available.  PMFS History: Patient Active Problem List   Diagnosis Date Noted   Chronic kidney disease, stage 3a (HCC) 10/21/2023   Carotid artery disease 08/23/2023   Left carotid bruit 07/10/2023   Chronic sinusitis 06/28/2022   Atherosclerosis of coronary artery 06/27/2021   Aortic atherosclerosis 06/21/2020   Dizziness 06/21/2020   Generalized osteoarthritis of hand 12/22/2019   Acquired trigger finger of right ring finger 08/20/2019   Environmental allergies 01/13/2019   Cough 12/17/2018   Allergic conjunctivitis 12/17/2018   Chronic frontal sinusitis 12/17/2018   Perennial allergic rhinitis with possible nonallergic component 11/13/2018   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   Gout 06/28/2018   Hallux rigidus of right foot 08/30/2017   Hypertension associated with type 2 diabetes mellitus (HCC) 06/26/2017   OSA (obstructive sleep apnea) 06/26/2017   Tobacco abuse, in remission 06/26/2017   Type 2 diabetes mellitus with stage 3a chronic kidney disease, without long-term current use of insulin  (HCC) 06/22/2016   Hypothyroidism 06/22/2016   Hyperlipidemia associated with type 2 diabetes mellitus (HCC) 06/22/2016   GERD (gastroesophageal reflux disease) 06/22/2016   Status post cervical spinal fusion 05/23/2016   Cervical spinal stenosis 04/05/2016   Pain in right wrist 03/07/2016   Neck pain 12/01/2015   Other spondylosis with radiculopathy, cervical region 12/01/2015   Diverticulitis of colon with perforation 10/20/2010   Past Medical History:  Diagnosis Date   Allergy    Arthritis    Diabetes mellitus without complication (HCC)    Diverticulitis of colon    GERD (gastroesophageal reflux disease)    Glaucoma    Gout    Headache    Hypertension    Hypothyroidism    Thyroid  disease    URI (upper respiratory infection) 11/26/2017    Family History  Problem Relation Age of Onset   Hypertension Mother    Diabetes Mother    Heart attack Father    Heart  disease Father    Diabetes Paternal Grandmother    Cancer Paternal Grandfather        of eye   Colon cancer Neg Hx    Stomach cancer Neg Hx    Esophageal cancer Neg Hx     Past Surgical History:  Procedure Laterality Date   ANKLE FRACTURE SURGERY     ANTERIOR CERVICAL DECOMP/DISCECTOMY FUSION N/A 04/05/2016   Procedure: C5-6, C6-7 Anterior Cervical Discectomy and Fusion, Allograft, Plate;  Surgeon: Oneil JAYSON Herald, MD;  Location: [facility];  Service: Orthopedics;  Laterality: N/A;   ANTERIOR FUSION CERVICAL SPINE  04/05/2016   c6 c7   CARPAL TUNNEL RELEASE Bilateral    CATARACT EXTRACTION Bilateral    2025   JOINT REPLACEMENT  01/2005   left knee replaced    JOINT REPLACEMENT  11/2008   right knee replaced    KNEE SURGERY     LEFT HEART CATH AND CORONARY ANGIOGRAPHY N/A 02/06/2017   Procedure: LEFT HEART CATH AND CORONARY ANGIOGRAPHY;  Surgeon: Claudene Victory ORN, MD;  Location: [facility];  Service: Cardiovascular;  Laterality: N/A;   LEG SURGERY  11/2001   broken lower right leg crushed    Social History   Occupational History   Occupation: [employer] RETIRED  Tobacco Use   Smoking status: Former    Current packs/day: 0.00    Types: Cigarettes    Quit date: 01/10/2008    Years since quitting: 15.8   Smokeless tobacco: Former    Types: Associate Professor status: Never Used  Substance and Sexual Activity   Alcohol use: Yes    Comment: occasionally   Drug use: No   Sexual activity: Yes    Birth control/protection: None    Comment: Married

## 2023-11-13 ENCOUNTER — Other Ambulatory Visit: Payer: Self-pay | Admitting: Internal Medicine

## 2023-12-03 ENCOUNTER — Ambulatory Visit: Admitting: Orthopedic Surgery

## 2023-12-03 DIAGNOSIS — M19012 Primary osteoarthritis, left shoulder: Secondary | ICD-10-CM

## 2023-12-04 ENCOUNTER — Encounter: Payer: Self-pay | Admitting: Orthopedic Surgery

## 2023-12-04 NOTE — Progress Notes (Signed)
 Office Visit Note   Patient: [name]           Date of Birth: 1950/08/21           MRN: [id number] Visit Date: 12/03/2023 Requested by: Geofm Glade PARAS, MD [address] [place],  [place] [address] PCP: Geofm Glade PARAS, MD  Subjective: Chief Complaint  Patient presents with   Left Shoulder - Pain    HPI: [name] is a 73 y.o. male who presents to the office reporting left shoulder pain and weakness.  Been going on 3 years.  Radiographs obtained earlier do show severe glenohumeral arthritis as well as rotator cuff arthropathy.  Patient really does not want any patch work he just wants this problem fixed.  Hard for him to sleep on that left-hand side.  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  He states that his right shoulder in general is fine but it is also starting to act up.  Does have a daughter at home who is an LPN.  She works for [employer].  Hard for him to hold his gun up for shooting.  He holds the gun with the left-hand side..                ROS: All systems reviewed are negative as they relate to the chief complaint within the history of present illness.  Patient denies fevers or chills.  Assessment & Plan: Visit Diagnoses:  1. Primary osteoarthritis, left shoulder     Plan: Impression is end-stage left shoulder arthritis and rotator cuff arthropathy.  He does have weakness as well as restricted range of motion.  Using models and diagrams the rationale behind reverse shoulder replacement and patient specific instrumentation is discussed.  The risk and benefits of reverse shoulder replacement are discussed including not limited to infection instability nerve or vessel damage incomplete pain weak as well as incomplete restoration of function.  Currently the patient really cannot get his hand up over his head or behind his head.  I think that would improve with reverse replacement.  He may still have some strength deficits with overhead motion but I think his  pain will be improved.  Patient understands risk and benefits and wishes to proceed.  Thin cut CT scan requested for preop evaluation.  All questions answered.  Follow-Up Instructions: No follow-ups on file.   Orders:  Orders Placed This Encounter  Procedures   CT SHOULDER LEFT WO CONTRAST   No orders of the defined types were placed in this encounter.     Procedures: No procedures performed   Clinical Data: No additional findings.  Objective: Vital Signs: There were no vitals taken for this visit.  Physical Exam:  Constitutional: Patient appears well-developed HEENT:  Head: Normocephalic Eyes:EOM are normal Neck: Normal range of motion Cardiovascular: Normal rate Pulmonary/chest: Effort normal Neurologic: Patient is alert Skin: Skin is warm Psychiatric: Patient has normal mood and affect  Ortho Exam: Ortho exam demonstrates range of motion on the left of 25/85/130.  He has active abduction and active forward flexion on the left only to about 65 degrees.  Deltoid fires.  Motor or sensory function to the hand is intact.  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  Cervical spine range of motion intact.  No paresthesias C5-T1.  Specialty Comments:  No specialty comments available.  Imaging: No results found.   PMFS History: Patient Active Problem List   Diagnosis Date Noted   Chronic  kidney disease, stage 3a (HCC) 10/21/2023   Carotid artery disease 08/23/2023   Left carotid bruit 07/10/2023   Chronic sinusitis 06/28/2022   Atherosclerosis of coronary artery 06/27/2021   Aortic atherosclerosis 06/21/2020   Dizziness 06/21/2020   Generalized osteoarthritis of hand 12/22/2019   Acquired trigger finger of right ring finger 08/20/2019   Environmental allergies 01/13/2019   Cough 12/17/2018   Allergic conjunctivitis 12/17/2018   Chronic frontal sinusitis 12/17/2018   Perennial allergic rhinitis with possible nonallergic component 11/13/2018    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   Gout 06/28/2018   Hallux rigidus of right foot 08/30/2017   Hypertension associated with type 2 diabetes mellitus (HCC) 06/26/2017   OSA (obstructive sleep apnea) 06/26/2017   Tobacco abuse, in remission 06/26/2017   Type 2 diabetes mellitus with stage 3a chronic kidney disease, without long-term current use of insulin  (HCC) 06/22/2016   Hypothyroidism 06/22/2016   Hyperlipidemia associated with type 2 diabetes mellitus (HCC) 06/22/2016   GERD (gastroesophageal reflux disease) 06/22/2016   Status post cervical spinal fusion 05/23/2016   Cervical spinal stenosis 04/05/2016   Pain in right wrist 03/07/2016   Neck pain 12/01/2015   Other spondylosis with radiculopathy, cervical region 12/01/2015   Diverticulitis of colon with perforation 10/20/2010   Past Medical History:  Diagnosis Date   Allergy    Arthritis    Diabetes mellitus without complication (HCC)    Diverticulitis of colon    GERD (gastroesophageal reflux disease)    Glaucoma    Gout    Headache    Hypertension    Hypothyroidism    Thyroid  disease    URI (upper respiratory infection) 11/26/2017    Family History  Problem Relation Age of Onset   Hypertension Mother    Diabetes Mother    Heart attack Father    Heart disease Father    Diabetes Paternal Grandmother    Cancer Paternal Grandfather        of eye   Colon cancer Neg Hx    Stomach cancer Neg Hx    Esophageal cancer Neg Hx     Past Surgical History:  Procedure Laterality Date   ANKLE FRACTURE SURGERY     ANTERIOR CERVICAL DECOMP/DISCECTOMY FUSION N/A 04/05/2016   Procedure: C5-6, C6-7 Anterior Cervical Discectomy and Fusion, Allograft, Plate;  Surgeon: Oneil JAYSON Herald, MD;  Location: [facility];  Service: Orthopedics;  Laterality: N/A;   ANTERIOR FUSION CERVICAL SPINE  04/05/2016   c6 c7   CARPAL TUNNEL RELEASE Bilateral    CATARACT EXTRACTION Bilateral    2025   JOINT REPLACEMENT  01/2005   left knee replaced     JOINT REPLACEMENT  11/2008   right knee replaced    KNEE SURGERY     LEFT HEART CATH AND CORONARY ANGIOGRAPHY N/A 02/06/2017   Procedure: LEFT HEART CATH AND CORONARY ANGIOGRAPHY;  Surgeon: Claudene Victory ORN, MD;  Location: [facility];  Service: Cardiovascular;  Laterality: N/A;   LEG SURGERY  11/2001   broken lower right leg crushed    Social History   Occupational History   Occupation: [employer] RETIRED  Tobacco Use   Smoking status: Former    Current packs/day: 0.00    Types: Cigarettes    Quit date: 01/10/2008    Years since quitting: 15.9   Smokeless tobacco: Former    Types: Engineer, Drilling   Vaping status: Never Used  Substance and Sexual Activity   Alcohol use: Yes    Comment: occasionally  Drug use: No   Sexual activity: Yes    Birth control/protection: None    Comment: Married

## 2023-12-12 ENCOUNTER — Ambulatory Visit
Admission: RE | Admit: 2023-12-12 | Discharge: 2023-12-12 | Disposition: A | Source: Ambulatory Visit | Attending: Orthopedic Surgery | Admitting: Orthopedic Surgery

## 2023-12-12 DIAGNOSIS — M19012 Primary osteoarthritis, left shoulder: Secondary | ICD-10-CM

## 2023-12-24 ENCOUNTER — Ambulatory Visit: Payer: Self-pay | Admitting: Orthopedic Surgery

## 2023-12-24 NOTE — Progress Notes (Signed)
 Hi Debbie.  Challen scan came in.  Can you set him up for surgery.  Reverse shoulder replacement on the left.  Thanks blue sheet done

## 2024-01-08 ENCOUNTER — Other Ambulatory Visit: Payer: Self-pay | Admitting: Internal Medicine

## 2024-01-08 NOTE — Progress Notes (Signed)
 Great thx

## 2024-01-11 ENCOUNTER — Encounter: Admitting: Internal Medicine

## 2024-01-11 ENCOUNTER — Ambulatory Visit
Admission: EM | Admit: 2024-01-11 | Discharge: 2024-01-11 | Disposition: A | Discharge status: Home / Self Care | Attending: Family Medicine | Admitting: Family Medicine

## 2024-01-11 DIAGNOSIS — J209 Acute bronchitis, unspecified: Secondary | ICD-10-CM | POA: Diagnosis not present

## 2024-01-11 MED ORDER — BENZONATATE 100 MG PO CAPS: 200.0000 mg | ORAL_CAPSULE | Freq: Three times a day (TID) | ORAL | 0 refills | Status: DC | PRN

## 2024-01-11 MED ORDER — PREDNISONE 10 MG PO TABS: 30.0000 mg | ORAL_TABLET | Freq: Every day | ORAL | 0 refills | Status: DC

## 2024-01-11 MED ORDER — AZITHROMYCIN 250 MG PO TABS: ORAL_TABLET | ORAL | 0 refills | Status: DC

## 2024-01-11 MED ORDER — PROMETHAZINE-DM 6.25-15 MG/5ML PO SYRP: 5.0000 mL | ORAL_SOLUTION | Freq: Every evening | ORAL | 0 refills | Status: DC | PRN

## 2024-01-11 NOTE — Discharge Instructions (Addendum)
 Start azithromycin to help with your bronchitis infection. Use prednisone  as a steroid to help clear you up in your lungs. Use cough capsules and cough syrup as needed.   For diabetes or elevated blood sugar, please make sure you are limiting and avoiding starchy, carbohydrate foods like pasta, breads, sweet breads, pastry, rice, potatoes, desserts. These foods can elevate your blood sugar. Also, limit and avoid drinks that contain a lot of sugar such as sodas, sweet teas, fruit juices.  Drinking plain water will be much more helpful, try 64 ounces of water daily.  It is okay to flavor your water naturally by cutting cucumber, lemon, mint or lime, placing it in a picture with water and drinking it over a period of 24-48 hours as long as it remains refrigerated.  For elevated blood pressure, make sure you are monitoring salt in your diet.  Do not eat restaurant foods and limit processed foods at home. I highly recommend you prepare and cook your own foods at home.  Processed foods include things like frozen meals, pre-seasoned meats and dinners, deli meats, canned foods as these foods contain a high amount of sodium/salt.  Make sure you are paying attention to sodium labels on foods you buy at the grocery store. Buy your spices separately such as garlic powder, onion powder, cumin, cayenne, parsley flakes so that you can avoid seasonings that contain salt. However, salt-free seasonings are available and can be used, an example is Mrs. Dash and includes a lot of different mixtures that do not contain salt.  Lastly, when cooking using oils that are healthier for you is important. This includes olive oil, avocado oil, canola oil. We have discussed a lot of foods to avoid but below is a list of foods that can be very healthy to use in your diet whether it is for diabetes, cholesterol, high blood pressure, or in general healthy eating.  Salads - kale, spinach, cabbage, spring mix, arugula Fruits - avocadoes,  berries (blueberries, raspberries, blackberries), apples, oranges, pomegranate, grapefruit, kiwi Vegetables - asparagus, cauliflower, broccoli, green beans, brussel sprouts, bell peppers, beets; stay away from or limit starchy vegetables like potatoes, carrots, peas Other general foods - kidney beans, egg whites, almonds, walnuts, sunflower seeds, pumpkin seeds, fat free yogurt, almond milk, flax seeds, quinoa, oats   DO NOT EAT ANY FOODS ON THIS LIST THAT YOU ARE ALLERGIC TO. For more specific needs, I highly recommend consulting a dietician or nutritionist but this can definitely be a good starting point.

## 2024-01-11 NOTE — ED Provider Notes (Signed)
 " [facility]  Note:  This document was prepared using Conservation officer, historic buildings and may include unintentional dictation errors.  MRN: [id number] DOB: [birth year]  Subjective:   [name] is a 74 y.o. male presenting for 1.5-2 week history of chest tightness, productive cough, chest congestion. Denies fever, sinus congestion, sinus pain, throat pain, ear pain, body aches, chills, hemoptysis. No asthma. No smoking of any kind including cigarettes, cigars, vaping, marijuana use.  He quit smoking 12 years ago.  Used to have significant bouts of bronchitis.  Has type 2 diabetes treated without insulin .  Last A1c was less than 7% 10/22/2023.  Current Outpatient Medications  Medication Instructions   allopurinol  (ZYLOPRIM ) 100 MG tablet Take 1 tablet by mouth twice daily   amLODipine  (NORVASC ) 5 mg, Oral, Daily   amLODipine -valsartan  (EXFORGE ) 10-320 MG tablet 1 tablet, Oral, Daily   atorvastatin  (LIPITOR) 10 mg, Oral, Daily   Azelastine  HCl 0.15 % SOLN 1-2 sprays, Each Nare, 2 times daily PRN   B Complex-C (SUPER B COMPLEX PO) 1 tablet, Daily   Calcium  Carbonate-Vitamin D (CALCIUM -VITAMIN D3 PO) 1 tablet, Every other day   empagliflozin  (JARDIANCE ) 25 mg, Oral, Daily before breakfast   Garlic 1,000 mg, Every other day   glucosamine-chondroitin 500-400 MG tablet 2 tablets, Daily   levocetirizine (XYZAL) 10 mg, Every evening   levothyroxine  (SYNTHROID ) 112 mcg, Oral, Daily   Magnesium 100 mg, Every other day   meloxicam  (MOBIC ) 15 MG tablet TAKE 1 TABLET BY MOUTH ONCE DAILY AS NEEDED WITH FOOD FOR PAIN   metFORMIN  (GLUCOPHAGE -XR) 500 MG 24 hr tablet TAKE 2 TABLETS BY MOUTH TWICE DAILY WITH A MEAL   montelukast  (SINGULAIR ) 10 mg, Oral, Daily at bedtime   Multiple Vitamins-Minerals (MULTIVITAMIN ADULTS 50+ PO) 1 tablet, Daily   pantoprazole  (PROTONIX ) 40 mg, Oral, Daily   vitamin C with rose hips 1,000 mg, Daily   Vitamin D3 1,000 Units, Daily   vitamin E  400 Units, Every other day   Zinc 50 MG TABS Take by mouth.    Allergies[1]  Past Medical History:  Diagnosis Date   Allergy    Arthritis    Diabetes mellitus without complication (HCC)    Diverticulitis of colon    GERD (gastroesophageal reflux disease)    Glaucoma    Gout    Headache    Hypertension    Hypothyroidism    Thyroid  disease    URI (upper respiratory infection) 11/26/2017     Past Surgical History:  Procedure Laterality Date   ANKLE FRACTURE SURGERY     ANTERIOR CERVICAL DECOMP/DISCECTOMY FUSION N/A 04/05/2016   Procedure: C5-6, C6-7 Anterior Cervical Discectomy and Fusion, Allograft, Plate;  Surgeon: Oneil JAYSON Herald, MD;  Location: [facility];  Service: Orthopedics;  Laterality: N/A;   ANTERIOR FUSION CERVICAL SPINE  04/05/2016   c6 c7   CARPAL TUNNEL RELEASE Bilateral    CATARACT EXTRACTION Bilateral    2025   JOINT REPLACEMENT  01/2005   left knee replaced    JOINT REPLACEMENT  11/2008   right knee replaced    KNEE SURGERY     LEFT HEART CATH AND CORONARY ANGIOGRAPHY N/A 02/06/2017   Procedure: LEFT HEART CATH AND CORONARY ANGIOGRAPHY;  Surgeon: Claudene Victory ORN, MD;  Location: [facility];  Service: Cardiovascular;  Laterality: N/A;   LEG SURGERY  11/2001   broken lower right leg crushed     Family History  Problem Relation Age of Onset  Hypertension Mother    Diabetes Mother    Heart attack Father    Heart disease Father    Diabetes Paternal Grandmother    Cancer Paternal Grandfather        of eye   Colon cancer Neg Hx    Stomach cancer Neg Hx    Esophageal cancer Neg Hx     Social History   Occupational History   Occupation: [employer] RETIRED  Tobacco Use   Smoking status: Former    Current packs/day: 0.00    Types: Cigarettes    Quit date: 01/10/2008    Years since quitting: 16.0   Smokeless tobacco: Former    Types: Associate Professor status: Never Used  Substance and Sexual Activity   Alcohol use: Yes    Comment:  occasionally   Drug use: No   Sexual activity: Yes    Birth control/protection: None    Comment: Married     ROS   Objective:   Vitals: BP (!) 142/75 (BP Location: Right Arm)   Pulse 63   Temp 98.5 F (36.9 C) (Oral)   Resp 16   SpO2 96%   Physical Exam Constitutional:      General: He is not in acute distress.    Appearance: Normal appearance. He is well-developed and normal weight. He is not ill-appearing, toxic-appearing or diaphoretic.  HENT:     Head: Normocephalic and atraumatic.     Right Ear: Tympanic membrane, ear canal and external ear normal. No drainage, swelling or tenderness. No middle ear effusion. There is no impacted cerumen. Tympanic membrane is not erythematous or bulging.     Left Ear: Tympanic membrane, ear canal and external ear normal. No drainage, swelling or tenderness.  No middle ear effusion. There is no impacted cerumen. Tympanic membrane is not erythematous or bulging.     Nose: Nose normal. No congestion or rhinorrhea.     Mouth/Throat:     Mouth: Mucous membranes are moist.     Pharynx: No oropharyngeal exudate or posterior oropharyngeal erythema.  Eyes:     General: No scleral icterus.       Right eye: No discharge.        Left eye: No discharge.     Extraocular Movements: Extraocular movements intact.     Conjunctiva/sclera: Conjunctivae normal.  Cardiovascular:     Rate and Rhythm: Normal rate and regular rhythm.     Heart sounds: Normal heart sounds. No murmur heard.    No friction rub. No gallop.  Pulmonary:     Effort: Pulmonary effort is normal. No respiratory distress.     Breath sounds: Normal breath sounds. No stridor. No wheezing, rhonchi or rales.  Musculoskeletal:     Cervical back: Normal range of motion and neck supple. No rigidity. No muscular tenderness.  Neurological:     General: No focal deficit present.     Mental Status: He is alert and oriented to person, place, and time.  Psychiatric:        Mood and Affect:  Mood normal.        Behavior: Behavior normal.        Thought Content: Thought content normal.     Assessment and Plan :   PDMP not reviewed this encounter.  1. Acute bronchitis, unspecified organism      CrCl 90mL/min - 66 mL/min based off of Cr level from 10/22/2023.  Will manage for bronchitis with azithromycin, prednisone  30 mg for 5  days.  Use supportive care otherwise.  Counseled patient on potential for adverse effects with medications prescribed/recommended today, ER and return-to-clinic precautions discussed, patient verbalized understanding.      [1]  Allergies Allergen Reactions   Morphine Other (See Comments)    Severe headache Severe headache Severe headache Severe headache     Christopher Savannah, PA-C 01/11/24 [id number]  "

## 2024-01-11 NOTE — ED Triage Notes (Signed)
 Pt reports cough and chest congestion x 1 1/2-2 weeks. OTC meds gives no relief.

## 2024-01-14 ENCOUNTER — Other Ambulatory Visit: Payer: Self-pay

## 2024-01-14 ENCOUNTER — Encounter: Payer: Self-pay | Admitting: Internal Medicine

## 2024-01-14 DIAGNOSIS — I2510 Atherosclerotic heart disease of native coronary artery without angina pectoris: Secondary | ICD-10-CM

## 2024-01-14 MED ORDER — AMLODIPINE BESYLATE-VALSARTAN 10-320 MG PO TABS: 1.0000 | ORAL_TABLET | Freq: Every day | ORAL | 1 refills | Status: DC

## 2024-01-14 MED ORDER — AMLODIPINE BESYLATE 5 MG PO TABS: 5.0000 mg | ORAL_TABLET | Freq: Every day | ORAL | 1 refills | Status: DC

## 2024-01-15 ENCOUNTER — Telehealth: Payer: Self-pay

## 2024-01-15 ENCOUNTER — Telehealth: Payer: Self-pay | Admitting: Orthopedic Surgery

## 2024-01-15 NOTE — Telephone Encounter (Signed)
 Copied from CRM [id number]. Topic: General - Other >> Jan 15, 2024 10:51 AM Laymon HERO wrote: Reason for CRM: Hillard Perry- sending a medical clearance for patient [phone]

## 2024-01-15 NOTE — Telephone Encounter (Signed)
 Patient called to say he was mistaken about the appointment with PCP-Dr Glade Hope for last Friday, Jan 11, 2024. He was hoping to get cleared for the left reverse shoulder arthroplasty from a medical and cardiac standpoint at that appointment.  Apparently the appointment for Friday had been cancelled back in October and rescheduled to 02/08/24 by the office.  A clearance request was sent with confirmation of receipt on 01/02/24. According to their office it has not been received.  The fax number was verified and the fax was sent again with confirmation of receipt.  I did explain to the patient both medical and cardiac clearances are required prior to providing a surgery date. Not sure if the PCP will clear for both or patient will if patient willl need an appointment with a Cardiologist.   Patient shows interest in having surgery in February of 2026,   Notes on 01/11/24 from Urgent Care indicate patient had 1.5-2 week history of chest tightness, productive cough, chest congestion. I offered to send a request for clearance to [facility]. Patient states it has been several years since he was seen by Dr Victory Sharps and would prefer to follow up with his primary doctor to see what she will do about the clearances.

## 2024-01-16 NOTE — Telephone Encounter (Signed)
Clearance faxed today and fax conformation received.

## 2024-01-17 NOTE — Progress Notes (Signed)
 "    Cardiology Office Note   Date:  01/18/2024   ID:  [name], DOB 06-Nov-1950, MRN [id number]  PCP:  Geofm Glade PARAS, MD  Cardiologist:   Victory LELON Claudene DOUGLAS, MD (Inactive) Referring:  Geofm Glade PARAS, MD  Chief Complaint  Patient presents with   Coronary Artery Disease      History of Present Illness: [name] is a 74 y.o. male who presents for preop evaluation.  He is going to have shoulder replacement. He was seen by Dr. Claudene and had cath in 2019 and had 40% diagonal stenosis.  I went back and reviewed some records and he had some chest discomfort at that time.  He had an abnormal treadmill test but no obstructive coronary disease.  He has done very well since then. The patient denies any new symptoms such as chest discomfort, neck or arm discomfort. There has been no new shortness of breath, PND or orthopnea. There have been no reported palpitations, presyncope or syncope.  He is a very active person.  He does a lot of yard work including walking behind [employer] and doing a scientist, physiological.  He denies any cardiovascular symptoms.  Past Medical History:  Diagnosis Date   Allergy    Arthritis    Diabetes mellitus without complication (HCC)    Diverticulitis of colon    GERD (gastroesophageal reflux disease)    Glaucoma    Gout    Headache    Hypertension    Hypothyroidism    Thyroid  disease    URI (upper respiratory infection) 11/26/2017    Past Surgical History:  Procedure Laterality Date   ANKLE FRACTURE SURGERY     ANTERIOR CERVICAL DECOMP/DISCECTOMY FUSION N/A 04/05/2016   Procedure: C5-6, C6-7 Anterior Cervical Discectomy and Fusion, Allograft, Plate;  Surgeon: Oneil JAYSON Herald, MD;  Location: [facility] OR;  Service: Orthopedics;  Laterality: N/A;   ANTERIOR FUSION CERVICAL SPINE  04/05/2016   c6 c7   CARPAL TUNNEL RELEASE Bilateral    CATARACT EXTRACTION Bilateral    2025   JOINT REPLACEMENT  01/2005   left knee replaced    JOINT REPLACEMENT  11/2008   right  knee replaced    KNEE SURGERY     LEFT HEART CATH AND CORONARY ANGIOGRAPHY N/A 02/06/2017   Procedure: LEFT HEART CATH AND CORONARY ANGIOGRAPHY;  Surgeon: Claudene Victory LELON, MD;  Location: [facility] INVASIVE CV LAB;  Service: Cardiovascular;  Laterality: N/A;   LEG SURGERY  11/2001   broken lower right leg crushed      Current Outpatient Medications  Medication Sig Dispense Refill   allopurinol  (ZYLOPRIM ) 100 MG tablet Take 1 tablet by mouth twice daily 180 tablet 0   amLODipine -valsartan  (EXFORGE ) 10-320 MG tablet Take 1 tablet by mouth daily. 90 tablet 1   Ascorbic Acid (VITAMIN C WITH ROSE HIPS) 500 MG tablet Take 1,000 mg by mouth daily.     atorvastatin  (LIPITOR) 10 MG tablet Take 1 tablet by mouth once daily 90 tablet 0   Azelastine  HCl 0.15 % SOLN Place 1-2 sprays into both nostrils 2 (two) times daily as needed. 30 mL 5   azithromycin  (ZITHROMAX ) 250 MG tablet Day 1: take 2 tablets. Day 2-5: Take 1 tablet daily. 6 tablet 0   B Complex-C (SUPER B COMPLEX PO) Take 1 tablet by mouth daily.     benzonatate  (TESSALON ) 100 MG capsule Take 2 capsules (200 mg total) by mouth 3 (three) times daily as needed for cough.  30 capsule 0   Calcium  Carbonate-Vitamin D (CALCIUM -VITAMIN D3 PO) Take 1 tablet by mouth every other day.     Cholecalciferol (VITAMIN D3) 1000 units CAPS Take 1,000 Units by mouth daily.     Garlic 1000 MG CAPS Take 1,000 mg by mouth every other day.     glucosamine-chondroitin 500-400 MG tablet Take 2 tablets by mouth daily.     levocetirizine (XYZAL) 5 MG tablet Take 10 mg by mouth every evening.     levothyroxine  (SYNTHROID ) 112 MCG tablet Take 1 tablet by mouth once daily 90 tablet 0   Magnesium 100 MG TABS Take 100 mg by mouth every other day.     meloxicam  (MOBIC ) 15 MG tablet TAKE 1 TABLET BY MOUTH ONCE DAILY AS NEEDED WITH FOOD FOR PAIN 90 tablet 0   metFORMIN  (GLUCOPHAGE -XR) 500 MG 24 hr tablet TAKE 2 TABLETS BY MOUTH TWICE DAILY WITH A MEAL 360 tablet 0   montelukast   (SINGULAIR ) 10 MG tablet Take 1 tablet (10 mg total) by mouth at bedtime. 30 tablet 5   Multiple Vitamins-Minerals (MULTIVITAMIN ADULTS 50+ PO) Take 1 tablet by mouth daily.     pantoprazole  (PROTONIX ) 40 MG tablet Take 1 tablet by mouth once daily 90 tablet 0   predniSONE  (DELTASONE ) 10 MG tablet Take 3 tablets (30 mg total) by mouth daily with breakfast. 15 tablet 0   promethazine -dextromethorphan (PROMETHAZINE -DM) 6.25-15 MG/5ML syrup Take 5 mLs by mouth at bedtime as needed for cough. 100 mL 0   vitamin E 400 UNIT capsule Take 400 Units by mouth every other day.     Zinc 50 MG TABS Take by mouth.     No current facility-administered medications for this visit.    Allergies:   Morphine    Social History:  The patient  reports that he quit smoking about 16 years ago. His smoking use included cigarettes. He has quit using smokeless tobacco.  His smokeless tobacco use included chew. He reports current alcohol use. He reports that he does not use drugs.   Family History:  The patient's family history includes Cancer in his paternal grandfather; Diabetes in his mother and paternal grandmother; Heart attack in his father; Heart disease in his father; Hypertension in his mother.    ROS:  Please see the history of present illness.   Otherwise, review of systems are positive for none.   All other systems are reviewed and negative.    PHYSICAL EXAM: VS:  BP 134/70 (BP Location: Left Arm, Patient Position: Sitting, Cuff Size: Normal)   Pulse 75   Ht 5' 9 (1.753 m)   Wt 190 lb (86.2 kg)   SpO2 95%   BMI 28.06 kg/m  , BMI Body mass index is 28.06 kg/m. GENERAL:  Well appearing HEENT:  Pupils equal round and reactive, fundi not visualized, oral mucosa unremarkable NECK:  No jugular venous distention, waveform within normal limits, carotid upstroke brisk and symmetric, no bruits, no thyromegaly LYMPHATICS:  No cervical, inguinal adenopathy LUNGS:  Clear to auscultation bilaterally BACK:  No  CVA tenderness CHEST:  Unremarkable HEART:  PMI not displaced or sustained,S1 and S2 within normal limits, no S3, no S4, no clicks, no rubs, no murmurs ABD:  Flat, positive bowel sounds normal in frequency in pitch, no bruits, no rebound, no guarding, no midline pulsatile mass, no hepatomegaly, no splenomegaly EXT:  2 plus pulses throughout, no edema, no cyanosis no clubbing SKIN:  No rashes no nodules NEURO:  Cranial nerves II through  XII grossly intact, motor grossly intact throughout [facility]:  Cognitively intact, oriented to person place and time   EKG:     Normal sinus rhythm, rate 61, axis within normal limits, intervals within normal limits, no acute ST-T wave changes, 10/12/2023   Recent Labs: 07/10/2023: Hemoglobin 13.1; Platelets 262.0; TSH 2.50 10/22/2023: ALT 18; BUN 19; Creatinine, Ser 1.24; Potassium 4.5; Sodium 140    Lipid Panel    Component Value Date/Time   CHOL 125 07/10/2023 1517   TRIG 158.0 (H) 07/10/2023 1517   HDL 39.50 07/10/2023 1517   CHOLHDL 3 07/10/2023 1517   VLDL 31.6 07/10/2023 1517   LDLCALC 54 07/10/2023 1517      Wt Readings from Last 3 Encounters:  01/18/24 190 lb (86.2 kg)  10/22/23 189 lb (85.7 kg)  08/17/23 193 lb (87.5 kg)      Other studies Reviewed: Additional studies/ records that were reviewed today include: Labs EKG from prior office visit. Review of the above records demonstrates:  Please see elsewhere in the note.     ASSESSMENT AND PLAN:   CAD: The patient had nonobstructive coronary disease in 2019's cath.  He has no new symptoms.  He will continue with risk reduction.  HTN: Blood pressure is at target.  No change in therapy.  DYSLIPIDEMIA: LDL was 54 with an HDL of 60.4.  No change in therapy.  PREOP: The patient is at acceptable risk for the planned procedure.  No further cardiovascular testing is suggested.  He has a high functional level.  He has no unstable symptoms or findings.  He is not going for a high risk  surgical procedure from a cardiovascular standpoint.  Current medicines are reviewed at length with the patient today.  The patient does not have concerns regarding medicines.  The following changes have been made:  no change  Labs/ tests ordered today include:  No orders of the defined types were placed in this encounter.    Disposition:   FU with me as needed.     Signed, Lynwood Schilling, MD  01/18/2024 4:05 PM    [facility]    "

## 2024-01-18 ENCOUNTER — Ambulatory Visit: Attending: Cardiology | Admitting: Cardiology

## 2024-01-18 ENCOUNTER — Encounter: Payer: Self-pay | Admitting: Cardiology

## 2024-01-18 VITALS — BP 134/70 | HR 75 | Ht 69.0 in | Wt 190.0 lb

## 2024-01-18 DIAGNOSIS — I2510 Atherosclerotic heart disease of native coronary artery without angina pectoris: Secondary | ICD-10-CM | POA: Diagnosis not present

## 2024-01-18 DIAGNOSIS — I10 Essential (primary) hypertension: Secondary | ICD-10-CM

## 2024-01-18 NOTE — Patient Instructions (Signed)

## 2024-02-01 ENCOUNTER — Other Ambulatory Visit: Payer: Self-pay | Admitting: Internal Medicine

## 2024-02-07 NOTE — Patient Instructions (Addendum)
" ° ° ° ° °  Blood work was ordered.       Medications changes include :   None     Return in about 6 months (around 08/07/2024) for follow up.  "

## 2024-02-07 NOTE — Progress Notes (Signed)
 "     Subjective:    Patient ID: [name], male    DOB: Feb 25, 1950, 74 y.o.   MRN: [id number]     HPI [name] is here for follow up of his chronic medical problems.   Discussed the use of AI scribe software for clinical note transcription with the patient, who gave verbal consent to proceed.  History of Present Illness [name] is a 74 year old male who presents with a persistent cough following a recent episode of bronchitis.  He has a persistent cough that returned after treatment for bronchitis with azithromycin  and cough medicine. The cough is sometimes productive but not consistently. He takes Mucinex twice daily, which helps thin the mucus. No sinus drainage is felt, but he experiences headaches in the morning. No fevers, nasal congestion, shortness of breath, wheezing, chest pain, palpitations, leg swelling, or lightheadedness, except when standing up too quickly.  He has a history of gout with recent pain and swelling in his wrist. He has not taken allopurinol  for some time as his prescription expired, and he recently had pain and swelling in his wrist.  He is awaiting shoulder surgery scheduled for February 10th and is concerned about the cough affecting the surgery. He has not received a call for preoperative instructions yet.  He discontinued Jardiance  due to cost and has been taking apple cider vinegar with water nightly for the past three weeks.  He received a flu shot this year and is up to date with pneumonia and shingles vaccinations. He has not had an eye exam in the past year due to scheduling conflicts with his upcoming surgery.    Medications and allergies reviewed with patient and updated if appropriate.  Medications Ordered Prior to Encounter[1]   Review of Systems  Constitutional:  Negative for fever.  HENT:  Negative for congestion.   Respiratory:  Positive for cough (lingering - hacking cough - occ some phlegm). Negative for shortness of  breath and wheezing.   Cardiovascular:  Negative for chest pain, palpitations and leg swelling.  Neurological:  Positive for headaches (frontal). Negative for light-headedness.       Objective:   Vitals:   02/08/24 0805  BP: (!) 140/76  Pulse: 68  Temp: 98 F (36.7 C)  SpO2: 98%   BP Readings from Last 3 Encounters:  02/08/24 (!) 140/76  01/18/24 134/70  01/11/24 (!) 142/75   Wt Readings from Last 3 Encounters:  02/08/24 185 lb (83.9 kg)  01/18/24 190 lb (86.2 kg)  10/22/23 189 lb (85.7 kg)   Body mass index is 27.32 kg/m.    Physical Exam Constitutional:      General: He is not in acute distress.    Appearance: Normal appearance. He is not ill-appearing.  HENT:     Head: Normocephalic and atraumatic.  Eyes:     Conjunctiva/sclera: Conjunctivae normal.  Cardiovascular:     Rate and Rhythm: Normal rate and regular rhythm.     Heart sounds: Normal heart sounds.  Pulmonary:     Effort: Pulmonary effort is normal. No respiratory distress.     Breath sounds: Normal breath sounds. No wheezing or rales.  Musculoskeletal:     Right lower leg: No edema.     Left lower leg: No edema.  Skin:    General: Skin is warm and dry.     Findings: No rash.  Neurological:     Mental Status: He is alert. Mental status is at baseline.  Psychiatric:  Mood and Affect: Mood normal.        Lab Results  Component Value Date   WBC 7.6 07/10/2023   HGB 13.1 07/10/2023   HCT 39.3 07/10/2023   PLT 262.0 07/10/2023   GLUCOSE 122 (H) 10/22/2023   CHOL 125 07/10/2023   TRIG 158.0 (H) 07/10/2023   HDL 39.50 07/10/2023   LDLCALC 54 07/10/2023   ALT 18 10/22/2023   AST 20 10/22/2023   NA 140 10/22/2023   K 4.5 10/22/2023   CL 103 10/22/2023   CREATININE 1.24 10/22/2023   BUN 19 10/22/2023   CO2 29 10/22/2023   TSH 2.50 07/10/2023   PSA 0.34 06/28/2022   INR 1.0 01/30/2017   HGBA1C 6.7 (H) 10/22/2023   MICROALBUR 169.7 (H) 07/10/2023     Assessment & Plan:     See Problem List for Assessment and Plan of chronic medical problems.       [1]  Current Outpatient Medications on File Prior to Visit  Medication Sig Dispense Refill   allopurinol  (ZYLOPRIM ) 100 MG tablet Take 1 tablet by mouth twice daily (Patient taking differently: Take 100 mg by mouth daily as needed (gout).) 180 tablet 0   amLODipine  (NORVASC ) 5 MG tablet Take 5 mg by mouth daily.     amLODipine -valsartan  (EXFORGE ) 5-320 MG tablet Take 1 tablet by mouth daily.     Ascorbic Acid (VITAMIN C WITH ROSE HIPS) 500 MG tablet Take 1,000 mg by mouth daily.     atorvastatin  (LIPITOR) 10 MG tablet Take 1 tablet by mouth once daily 90 tablet 0   B Complex-C (SUPER B COMPLEX PO) Take 1 tablet by mouth daily.     Calcium  Carbonate-Vitamin D (CALCIUM -VITAMIN D3 PO) Take 1 tablet by mouth every other day.     Cholecalciferol (VITAMIN D3) 1000 units CAPS Take 1,000 Units by mouth daily.     Garlic 1000 MG CAPS Take 1,000 mg by mouth every other day.     glucosamine-chondroitin 500-400 MG tablet Take 2 tablets by mouth daily.     levocetirizine (XYZAL) 5 MG tablet Take 10 mg by mouth every evening.     levothyroxine  (SYNTHROID ) 112 MCG tablet Take 1 tablet by mouth once daily 90 tablet 0   Magnesium 100 MG TABS Take 100 mg by mouth every other day.     metFORMIN  (GLUCOPHAGE -XR) 500 MG 24 hr tablet TAKE 2 TABLETS BY MOUTH TWICE DAILY WITH A MEAL 360 tablet 0   Multiple Vitamins-Minerals (MULTIVITAMIN ADULTS 50+ PO) Take 1 tablet by mouth daily.     pantoprazole  (PROTONIX ) 40 MG tablet Take 1 tablet by mouth once daily 90 tablet 0   vitamin E 400 UNIT capsule Take 400 Units by mouth every other day.     Zinc 50 MG TABS Take by mouth.     Azelastine  HCl 0.15 % SOLN Place 1-2 sprays into both nostrils 2 (two) times daily as needed. (Patient not taking: Reported on 02/08/2024) 30 mL 5   [DISCONTINUED] Azelastine -Fluticasone  137-50 MCG/ACT SUSP Place 1 spray into the nose 2 (two) times daily as needed.  (Patient not taking: Reported on 01/22/2023) 23 g 2   [DISCONTINUED] fluticasone  (FLONASE ) 50 MCG/ACT nasal spray Place 2 sprays into both nostrils daily. (Patient not taking: Reported on 09/16/2019) 1 g 5   No current facility-administered medications on file prior to visit.   "

## 2024-02-08 ENCOUNTER — Ambulatory Visit: Admitting: Internal Medicine

## 2024-02-08 VITALS — BP 120/84 | HR 68 | Temp 98.0°F | Ht 69.0 in | Wt 185.0 lb

## 2024-02-08 DIAGNOSIS — M109 Gout, unspecified: Secondary | ICD-10-CM | POA: Diagnosis not present

## 2024-02-08 DIAGNOSIS — E1159 Type 2 diabetes mellitus with other circulatory complications: Secondary | ICD-10-CM | POA: Diagnosis not present

## 2024-02-08 DIAGNOSIS — E039 Hypothyroidism, unspecified: Secondary | ICD-10-CM | POA: Diagnosis not present

## 2024-02-08 DIAGNOSIS — E1169 Type 2 diabetes mellitus with other specified complication: Secondary | ICD-10-CM

## 2024-02-08 DIAGNOSIS — I152 Hypertension secondary to endocrine disorders: Secondary | ICD-10-CM | POA: Diagnosis not present

## 2024-02-08 DIAGNOSIS — M159 Polyosteoarthritis, unspecified: Secondary | ICD-10-CM

## 2024-02-08 DIAGNOSIS — N1831 Chronic kidney disease, stage 3a: Secondary | ICD-10-CM | POA: Diagnosis not present

## 2024-02-08 DIAGNOSIS — K219 Gastro-esophageal reflux disease without esophagitis: Secondary | ICD-10-CM

## 2024-02-08 DIAGNOSIS — I6522 Occlusion and stenosis of left carotid artery: Secondary | ICD-10-CM

## 2024-02-08 DIAGNOSIS — I2510 Atherosclerotic heart disease of native coronary artery without angina pectoris: Secondary | ICD-10-CM

## 2024-02-08 DIAGNOSIS — E785 Hyperlipidemia, unspecified: Secondary | ICD-10-CM | POA: Diagnosis not present

## 2024-02-08 DIAGNOSIS — R051 Acute cough: Secondary | ICD-10-CM

## 2024-02-08 DIAGNOSIS — E1122 Type 2 diabetes mellitus with diabetic chronic kidney disease: Secondary | ICD-10-CM

## 2024-02-08 LAB — COMPREHENSIVE METABOLIC PANEL WITH GFR
ALT: 19 U/L (ref 3–53)
AST: 21 U/L (ref 5–37)
Albumin: 4.3 g/dL (ref 3.5–5.2)
Alkaline Phosphatase: 65 U/L (ref 39–117)
BUN: 21 mg/dL (ref 6–23)
CO2: 27 meq/L (ref 19–32)
Calcium: 9.7 mg/dL (ref 8.4–10.5)
Chloride: 103 meq/L (ref 96–112)
Creatinine, Ser: 1.26 mg/dL (ref 0.40–1.50)
GFR: 57.49 mL/min — ABNORMAL LOW
Glucose, Bld: 116 mg/dL — ABNORMAL HIGH (ref 70–99)
Potassium: 4.6 meq/L (ref 3.5–5.1)
Sodium: 138 meq/L (ref 135–145)
Total Bilirubin: 0.5 mg/dL (ref 0.2–1.2)
Total Protein: 7 g/dL (ref 6.0–8.3)

## 2024-02-08 LAB — LIPID PANEL
Cholesterol: 114 mg/dL (ref 28–200)
HDL: 46.2 mg/dL
LDL Cholesterol: 51 mg/dL (ref 10–99)
NonHDL: 67.36
Total CHOL/HDL Ratio: 2
Triglycerides: 82 mg/dL (ref 10.0–149.0)
VLDL: 16.4 mg/dL (ref 0.0–40.0)

## 2024-02-08 LAB — CBC
HCT: 38.4 % — ABNORMAL LOW (ref 39.0–52.0)
Hemoglobin: 12.9 g/dL — ABNORMAL LOW (ref 13.0–17.0)
MCHC: 33.5 g/dL (ref 30.0–36.0)
MCV: 88 fl (ref 78.0–100.0)
Platelets: 297 10*3/uL (ref 150.0–400.0)
RBC: 4.37 Mil/uL (ref 4.22–5.81)
RDW: 13.6 % (ref 11.5–15.5)
WBC: 6.1 10*3/uL (ref 4.0–10.5)

## 2024-02-08 LAB — MICROALBUMIN / CREATININE URINE RATIO
Creatinine,U: 64.7 mg/dL
Microalb Creat Ratio: 581.7 mg/g — ABNORMAL HIGH (ref 0.0–30.0)
Microalb, Ur: 37.6 mg/dL — ABNORMAL HIGH (ref 0.7–1.9)

## 2024-02-08 LAB — TSH: TSH: 2.76 u[IU]/mL (ref 0.35–5.50)

## 2024-02-08 LAB — VITAMIN D 25 HYDROXY (VIT D DEFICIENCY, FRACTURES): VITD: 40.81 ng/mL (ref 30.00–100.00)

## 2024-02-08 LAB — HEMOGLOBIN A1C: Hgb A1c MFr Bld: 7 % — ABNORMAL HIGH (ref 4.6–6.5)

## 2024-02-08 MED ORDER — COLCHICINE 0.6 MG PO TABS: ORAL_TABLET | ORAL | 5 refills | Status: AC

## 2024-02-08 NOTE — Assessment & Plan Note (Signed)
 Chronic He was taking meloxicam  15 mg daily as needed-takes rarely Encouraged Tylenol  as needed-would like to keep NSAIDs as minimal as possible

## 2024-02-08 NOTE — Assessment & Plan Note (Signed)
 Chronic  Clinically euthyroid Check TSH Continue levothyroxine  112 mcg daily

## 2024-02-08 NOTE — Assessment & Plan Note (Signed)
 Chronic Stage 3a-stable Stressed good blood pressure, sugar control Diet low in sodium and sugar encouraged CMP, CBC, vitamin D level

## 2024-02-08 NOTE — Assessment & Plan Note (Signed)
 Chronic Regular exercise and healthy diet encouraged Check lipid panel, CMP Continue atorvastatin 10 mg daily

## 2024-02-08 NOTE — Assessment & Plan Note (Signed)
 Chronic GERD controlled Continue pantoprazole 40 mg daily

## 2024-02-08 NOTE — Assessment & Plan Note (Signed)
 Chronic Occ flares Continue allopurinol  100 mg twice daily as needed-this seems to work for him so okay to continue

## 2024-02-08 NOTE — Assessment & Plan Note (Signed)
 Chronic Lab Results  Component Value Date   LDLCALC 54 07/10/2023   LDL at goal Continue atorvastatin  10 mg daily  Check lipids, CMP Healthy diet and increased activity encouraged

## 2024-02-08 NOTE — Assessment & Plan Note (Signed)
 Chronic Minimal disease in left carotid artery Continue atorvastatin  10 mg daily

## 2024-02-08 NOTE — Assessment & Plan Note (Signed)
 Acute Residual from recent infection Primarily dry, occasionally productive Symptomatic treatment-I expect this to slowly improve on its own No evidence of current infection symptoms and no antibiotic needed

## 2024-02-08 NOTE — Assessment & Plan Note (Addendum)
 Chronic Blood pressure controlled Monitor BP at home Continue amlodipine -valsartan  from  5-320 mg daily, amlodipine  5 mg daily

## 2024-02-09 ENCOUNTER — Ambulatory Visit: Payer: Self-pay | Admitting: Internal Medicine

## 2024-02-09 MED ORDER — DAPAGLIFLOZIN PROPANEDIOL 10 MG PO TABS: 10.0000 mg | ORAL_TABLET | Freq: Every day | ORAL | 5 refills | Status: AC

## 2024-02-13 ENCOUNTER — Other Ambulatory Visit: Payer: Self-pay | Admitting: Internal Medicine

## 2024-02-13 NOTE — Pre-Procedure Instructions (Signed)
 Surgical Instructions   Your procedure is scheduled on February 19, 2024. Report to [facility] Main Entrance A at 8:40 A.M., then check in with the Admitting office. Any questions or running late day of surgery: call [phone]  Questions prior to your surgery date: call [phone], Monday-Friday, 8am-4pm. If you experience any cold or flu symptoms such as cough, fever, chills, shortness of breath, etc. between now and your scheduled surgery, please notify us  at the above number.     Remember:  Do not eat after midnight the night before your surgery  You may drink clear liquids until 7:40 AM the morning of your surgery.   Clear liquids allowed are: Water, Non-Citrus Juices (without pulp), Carbonated Beverages, Clear Tea (no milk, honey, etc.), Black Coffee Only (NO MILK, CREAM OR POWDERED CREAMER of any kind), and Gatorade.    Take these medicines the morning of surgery with A SIP OF WATER: amLODipine  (NORVASC )  atorvastatin  (LIPITOR)  levothyroxine  (SYNTHROID )  pantoprazole  (PROTONIX )    May take these medicines IF NEEDED: colchicine     DO NOT TAKE YOUR amLODipine -valsartan  (EXFORGE ) MORNING OF SURGERY   One week prior to surgery, STOP taking any Aspirin  (unless otherwise instructed by your surgeon) Aleve, Naproxen, Ibuprofen, Motrin, Advil, Goody's, BC's, all herbal medications, fish oil, and non-prescription vitamins.   WHAT DO I DO ABOUT MY DIABETES MEDICATION?   Do not take metFORMIN  (GLUCOPHAGE -XR) the morning of surgery.  STOP taking your dapagliflozin  propanediol (FARXIGA ) three days prior to surgery. Your last dose will be February 6th.   HOW TO MANAGE YOUR DIABETES BEFORE AND AFTER SURGERY  Why is it important to control my blood sugar before and after surgery? Improving blood sugar levels before and after surgery helps healing and can limit problems. A way of improving blood sugar control is eating a healthy diet by:  Eating less sugar and  carbohydrates  Increasing activity/exercise  Talking with your doctor about reaching your blood sugar goals High blood sugars (greater than 180 mg/dL) can raise your risk of infections and slow your recovery, so you will need to focus on controlling your diabetes during the weeks before surgery. Make sure that the doctor who takes care of your diabetes knows about your planned surgery including the date and location.  How do I manage my blood sugar before surgery? Check your blood sugar at least 4 times a day, starting 2 days before surgery, to make sure that the level is not too high or low.  Check your blood sugar the morning of your surgery when you wake up and every 2 hours until you get to the Short Stay unit.  If your blood sugar is less than 70 mg/dL, you will need to treat for low blood sugar: Do not take insulin . Treat a low blood sugar (less than 70 mg/dL) with  cup of clear juice (cranberry or apple), 4 glucose tablets, OR glucose gel. Recheck blood sugar in 15 minutes after treatment (to make sure it is greater than 70 mg/dL). If your blood sugar is not greater than 70 mg/dL on recheck, call [phone] for further instructions. Report your blood sugar to the short stay nurse when you get to Short Stay.  If you are admitted to the hospital after surgery: Your blood sugar will be checked by the staff and you will probably be given insulin  after surgery (instead of oral diabetes medicines) to make sure you have good blood sugar levels. The goal for blood sugar control after surgery is 80-180  mg/dL.                      Do NOT Smoke (Tobacco/Vaping) for 24 hours prior to your procedure.  If you use a CPAP at night, you may bring your mask/headgear for your overnight stay.   You will be asked to remove any contacts, glasses, piercing's, hearing aid's, dentures/partials prior to surgery. Please bring cases for these items if needed.    Your surgeon will determine if you are to  be admitted or discharged the same day.  Patients discharged the day of surgery will not be allowed to drive home, and someone needs to stay with them for 24 hours.  SURGICAL WAITING ROOM VISITATION Patients may have no more than 2 support people in the waiting area - these visitors may rotate.   Pre-op nurse will coordinate an appropriate time for 2 ADULT support persons, who may not rotate, to accompany patient in pre-op.  Children under the age of 80 must have an adult with them who is not the patient and must remain in the main waiting area with an adult.  If the patient needs to stay at the hospital during part of their recovery, the visitor guidelines for inpatient rooms apply.  Please refer to the [facility] website for the visitor guidelines for any additional information.   If you received a COVID test during your pre-op visit  it is requested that you wear a mask when out in public, stay away from anyone that may not be feeling well and notify your surgeon if you develop symptoms. If you have been in contact with anyone that has tested positive in the last 10 days please notify you surgeon.      Pre-operative 4 CHG Bathing Instructions   You can play a key role in reducing the risk of infection after surgery. Your skin needs to be as free of germs as possible. You can reduce the number of germs on your skin by washing with CHG (chlorhexidine  gluconate) soap before surgery. CHG is an antiseptic soap that kills germs and continues to kill germs even after washing.   DO NOT use if you have an allergy to chlorhexidine /CHG or antibacterial soaps. If your skin becomes reddened or irritated, stop using the CHG and notify one of our RNs at [phone].   Please shower with the CHG soap starting 4 days before surgery using the following schedule:     Please keep in mind the following:  DO NOT shave, including legs and underarms, starting the day of your first shower.   You may shave  your face at any point before/day of surgery.  Place clean sheets on your bed the day you start using CHG soap. Use a clean washcloth (not used since being washed) for each shower. DO NOT sleep with pets once you start using the CHG.   CHG Shower Instructions:  Wash your face and private area with normal soap. If you choose to wash your hair, wash first with your normal shampoo.  After you use shampoo/soap, rinse your hair and body thoroughly to remove shampoo/soap residue.  Turn the water OFF and apply  bottle of CHG soap to a CLEAN washcloth.  Apply CHG soap ONLY FROM YOUR NECK DOWN TO YOUR TOES (washing for 3-5 minutes)  DO NOT use CHG soap on face, private areas, open wounds, or sores.  Pay special attention to the area where your surgery is being performed.  If you  are having back surgery, having someone wash your back for you may be helpful. Wait 2 minutes after CHG soap is applied, then you may rinse off the CHG soap.  Pat dry with a clean towel  Put on clean clothes/pajamas   If you choose to wear lotion, please use ONLY the CHG-compatible lotions that are listed below.  Additional instructions for the day of surgery:  If you choose, you may shower the morning of surgery with an antibacterial soap.  DO NOT APPLY any lotions, deodorants, cologne, or perfumes.   Do not bring valuables to the hospital. [facility] is not responsible for any belongings/valuables. Do not wear nail polish, gel polish, artificial nails, or any other type of covering on natural nails (fingers and toes) Do not wear jewelry or makeup Put on clean/comfortable clothes.  Please brush your teeth.  Ask your nurse before applying any prescription medications to the skin.     CHG Compatible Lotions   Aveeno Moisturizing lotion  Cetaphil Moisturizing Cream  Cetaphil Moisturizing Lotion  Clairol Herbal Essence Moisturizing Lotion, Dry Skin  Clairol Herbal Essence Moisturizing Lotion, Extra Dry Skin   Clairol Herbal Essence Moisturizing Lotion, Normal Skin  Curel Age Defying Therapeutic Moisturizing Lotion with Alpha Hydroxy  Curel Extreme Care Body Lotion  Curel Soothing Hands Moisturizing Hand Lotion  Curel Therapeutic Moisturizing Cream, Fragrance-Free  Curel Therapeutic Moisturizing Lotion, Fragrance-Free  Curel Therapeutic Moisturizing Lotion, Original Formula  Eucerin Daily Replenishing Lotion  Eucerin Dry Skin Therapy Plus Alpha Hydroxy Crme  Eucerin Dry Skin Therapy Plus Alpha Hydroxy Lotion  Eucerin Original Crme  Eucerin Original Lotion  Eucerin Plus Crme Eucerin Plus Lotion  Eucerin TriLipid Replenishing Lotion  Keri Anti-Bacterial Hand Lotion  Keri Deep Conditioning Original Lotion Dry Skin Formula Softly Scented  Keri Deep Conditioning Original Lotion, Fragrance Free Sensitive Skin Formula  Keri Lotion Fast Absorbing Fragrance Free Sensitive Skin Formula  Keri Lotion Fast Absorbing Softly Scented Dry Skin Formula  Keri Original Lotion  Keri Skin Renewal Lotion Keri Silky Smooth Lotion  Keri Silky Smooth Sensitive Skin Lotion  Nivea Body Creamy Conditioning Oil  Nivea Body Extra Enriched Lotion  Nivea Body Original Lotion  Nivea Body Sheer Moisturizing Lotion Nivea Crme  Nivea Skin Firming Lotion  NutraDerm 30 Skin Lotion  NutraDerm Skin Lotion  NutraDerm Therapeutic Skin Cream  NutraDerm Therapeutic Skin Lotion  ProShield Protective Hand Cream  Provon moisturizing lotion   [place]- Preparing for Total Shoulder Arthroplasty  Before surgery, you can play an important role. Because skin is not sterile, your skin needs to be as free of germs as possible. You can reduce the number of germs on your skin by using the following products.   Benzoyl Peroxide Gel  o Reduces the number of germs present on the skin  o Applied twice a day to shoulder area starting two days before surgery   Chlorhexidine  Gluconate (CHG) Soap (instructions listed above on how to  wash with CHG Soap)  o An antiseptic cleaner that kills germs and bonds with the skin to continue killing germs even after washing  o Used for showering the night before surgery and morning of surgery   ==================================================================  Please follow these instructions carefully:  BENZOYL PEROXIDE 5% GEL  Please do not use if you have an allergy to benzoyl peroxide. If your skin becomes reddened/irritated stop using the benzoyl peroxide.  Starting two days before surgery, apply as follows:  1. Apply benzoyl peroxide in the morning and at night.  Apply after taking a shower. If you are not taking a shower clean entire shoulder front, back, and side along with the armpit with a clean wet washcloth.  2. Place a quarter-sized dollop on your SHOULDER and rub in thoroughly, making sure to cover the front, back, and side of your shoulder, along with the armpit.   2 Days prior to Surgery First Dose on _____________ Morning Second Dose on ______________ Night  Day Before Surgery First Dose on ______________ Morning Night before surgery wash (entire body except face and private areas) with CHG Soap THEN Second Dose on ____________ Night     4. Do NOT apply benzoyl peroxide gel on the day of surgery    Please read over the following fact sheets that you were given.

## 2024-02-14 ENCOUNTER — Other Ambulatory Visit: Payer: Self-pay

## 2024-02-14 ENCOUNTER — Inpatient Hospital Stay (HOSPITAL_COMMUNITY): Admission: RE | Admit: 2024-02-14 | Discharge: 2024-02-14 | Attending: Orthopedic Surgery

## 2024-02-14 ENCOUNTER — Encounter (HOSPITAL_COMMUNITY): Payer: Self-pay

## 2024-02-14 VITALS — BP 145/79 | HR 60 | Temp 98.1°F | Resp 19 | Ht 69.0 in | Wt 183.0 lb

## 2024-02-14 DIAGNOSIS — Z01818 Encounter for other preprocedural examination: Secondary | ICD-10-CM

## 2024-02-14 HISTORY — DX: Chronic kidney disease, unspecified: N18.9

## 2024-02-14 HISTORY — DX: Sleep apnea, unspecified: G47.30

## 2024-02-14 HISTORY — DX: Nausea with vomiting, unspecified: R11.2

## 2024-02-14 LAB — SURGICAL PCR SCREEN
MRSA, PCR: NEGATIVE
Staphylococcus aureus: NEGATIVE

## 2024-02-14 LAB — GLUCOSE, CAPILLARY: Glucose-Capillary: 136 mg/dL — ABNORMAL HIGH (ref 70–99)

## 2024-02-14 NOTE — Progress Notes (Signed)
 PCP - [facility] Cardiologist - Dr. Lynwood Schilling - follow up PRN  PPM/ICD - denies Device Orders - n/a Rep Notified - n/a  Chest x-ray - denies EKG - 10/22/23 Stress Test - 12/15/04 ECHO - 01/23/17 Cardiac Cath - 02/06/17  Sleep Study - OSA+ but does not wear CPAP CPAP - n/a  Fasting Blood Sugar - unknown - patient does not check blood sugar at home   Last dose of GLP1 agonist-  n/a GLP1 instructions:  n/a  Patient instructed to stop Farxiga  3 days prior to surgery.   Blood Thinner Instructions: n/a Aspirin  Instructions: n/a  ERAS Protcol - clears until 0730 PRE-SURGERY Ensure or G2-  G2 as ordered  COVID TEST- n/a   Anesthesia review: yes - patient was diagnosed with bronchitis at the beginning of January and was treated.  Patient reports a slight throat clearing occasionally.    Patient denies shortness of breath, fever, cough and chest pain at PAT appointment   All instructions explained to the patient, with a verbal understanding of the material. Patient agrees to go over the instructions while at home for a better understanding. Patient also instructed to self quarantine after being tested for COVID-19. The opportunity to ask questions was provided.

## 2024-02-14 NOTE — Progress Notes (Signed)
 Surgical Instructions     Your procedure is scheduled on February 19, 2024. Report to [facility] Main Entrance A at 8:40 A.M., then check in with the Admitting office. Any questions or running late day of surgery: call [phone]   Questions prior to your surgery date: call [phone], Monday-Friday, 8am-4pm. If you experience any cold or flu symptoms such as cough, fever, chills, shortness of breath, etc. between now and your scheduled surgery, please notify us  at the above number.            Remember:       Do not eat after midnight the night before your surgery   You may drink clear liquids until 7:30 AM the morning of your surgery.   Clear liquids allowed are: Water, Non-Citrus Juices (without pulp), Carbonated Beverages, Clear Tea (no milk, honey, etc.), Black Coffee Only (NO MILK, CREAM OR POWDERED CREAMER of any kind), and Gatorade.  Patient Instructions  The night before surgery:  No food after midnight. ONLY clear liquids after midnight  The day of surgery (if you have diabetes): Drink ONE (1) 12 oz G2 given to you in your pre admission testing appointment by 7:30 the morning of surgery. Drink in one sitting. Do not sip.  This drink was given to you during your hospital  pre-op appointment visit.  Nothing else to drink after completing the  12 oz bottle of G2.         If you have questions, please contact your surgeons office.           Take these medicines the morning of surgery with A SIP OF WATER: amLODipine  (NORVASC )  atorvastatin  (LIPITOR)  levothyroxine  (SYNTHROID )  pantoprazole  (PROTONIX )      May take these medicines IF NEEDED: colchicine       DO NOT TAKE YOUR amLODipine -valsartan  (EXFORGE ) MORNING OF SURGERY     One week prior to surgery, STOP taking any Aspirin  (unless otherwise instructed by your surgeon) Aleve, Naproxen, Ibuprofen, Motrin, Advil, Goody's, BC's, all herbal medications, fish oil, and non-prescription vitamins.     WHAT DO I  DO ABOUT MY DIABETES MEDICATION?     Do not take metFORMIN  (GLUCOPHAGE -XR) the morning of surgery.   STOP taking your dapagliflozin  propanediol (FARXIGA ) three days prior to surgery. Your last dose will be February 6th.     HOW TO MANAGE YOUR DIABETES BEFORE AND AFTER SURGERY   Why is it important to control my blood sugar before and after surgery? Improving blood sugar levels before and after surgery helps healing and can limit problems. A way of improving blood sugar control is eating a healthy diet by:  Eating less sugar and carbohydrates  Increasing activity/exercise  Talking with your doctor about reaching your blood sugar goals High blood sugars (greater than 180 mg/dL) can raise your risk of infections and slow your recovery, so you will need to focus on controlling your diabetes during the weeks before surgery. Make sure that the doctor who takes care of your diabetes knows about your planned surgery including the date and location.   How do I manage my blood sugar before surgery? Check your blood sugar at least 4 times a day, starting 2 days before surgery, to make sure that the level is not too high or low.   Check your blood sugar the morning of your surgery when you wake up and every 2 hours until you get to the Short Stay unit.   If your blood sugar is less than  70 mg/dL, you will need to treat for low blood sugar: Do not take insulin . Treat a low blood sugar (less than 70 mg/dL) with  cup of clear juice (cranberry or apple), 4 glucose tablets, OR glucose gel. Recheck blood sugar in 15 minutes after treatment (to make sure it is greater than 70 mg/dL). If your blood sugar is not greater than 70 mg/dL on recheck, call [phone] for further instructions. Report your blood sugar to the short stay nurse when you get to Short Stay.   If you are admitted to the hospital after surgery: Your blood sugar will be checked by the staff and you will probably be given insulin  after  surgery (instead of oral diabetes medicines) to make sure you have good blood sugar levels. The goal for blood sugar control after surgery is 80-180 mg/dL.                       Do NOT Smoke (Tobacco/Vaping) for 24 hours prior to your procedure.   If you use a CPAP at night, you may bring your mask/headgear for your overnight stay.   You will be asked to remove any contacts, glasses, piercing's, hearing aid's, dentures/partials prior to surgery. Please bring cases for these items if needed.    Your surgeon will determine if you are to be admitted or discharged the same day.  Patients discharged the day of surgery will not be allowed to drive home, and someone needs to stay with them for 24 hours.   SURGICAL WAITING ROOM VISITATION Patients may have no more than 2 support people in the waiting area - these visitors may rotate.   Pre-op nurse will coordinate an appropriate time for 2 ADULT support persons, who may not rotate, to accompany patient in pre-op.  Children under the age of 66 must have an adult with them who is not the patient and must remain in the main waiting area with an adult.   If the patient needs to stay at the hospital during part of their recovery, the visitor guidelines for inpatient rooms apply.   Please refer to the [facility] website for the visitor guidelines for any additional information.     If you received a COVID test during your pre-op visit  it is requested that you wear a mask when out in public, stay away from anyone that may not be feeling well and notify your surgeon if you develop symptoms. If you have been in contact with anyone that has tested positive in the last 10 days please notify you surgeon.         Pre-operative 4 CHG Bathing Instructions    You can play a key role in reducing the risk of infection after surgery. Your skin needs to be as free of germs as possible. You can reduce the number of germs on your skin by washing with CHG  (chlorhexidine  gluconate) soap before surgery. CHG is an antiseptic soap that kills germs and continues to kill germs even after washing.    DO NOT use if you have an allergy to chlorhexidine /CHG or antibacterial soaps. If your skin becomes reddened or irritated, stop using the CHG and notify one of our RNs at [phone].    Please shower with the CHG soap starting 4 days before surgery using the following schedule:       Please keep in mind the following:  DO NOT shave, including legs and underarms, starting the day of your first  shower.   You may shave your face at any point before/day of surgery.  Place clean sheets on your bed the day you start using CHG soap. Use a clean washcloth (not used since being washed) for each shower. DO NOT sleep with pets once you start using the CHG.    CHG Shower Instructions:  Wash your face and private area with normal soap. If you choose to wash your hair, wash first with your normal shampoo.  After you use shampoo/soap, rinse your hair and body thoroughly to remove shampoo/soap residue.  Turn the water OFF and apply  bottle of CHG soap to a CLEAN washcloth.  Apply CHG soap ONLY FROM YOUR NECK DOWN TO YOUR TOES (washing for 3-5 minutes)  DO NOT use CHG soap on face, private areas, open wounds, or sores.  Pay special attention to the area where your surgery is being performed.  If you are having back surgery, having someone wash your back for you may be helpful. Wait 2 minutes after CHG soap is applied, then you may rinse off the CHG soap.  Pat dry with a clean towel  Put on clean clothes/pajamas   If you choose to wear lotion, please use ONLY the CHG-compatible lotions that are listed below.   Additional instructions for the day of surgery:   If you choose, you may shower the morning of surgery with an antibacterial soap.  DO NOT APPLY any lotions, deodorants, cologne, or perfumes.   Do not bring valuables to the hospital. [facility] is not  responsible for any belongings/valuables. Do not wear nail polish, gel polish, artificial nails, or any other type of covering on natural nails (fingers and toes) Do not wear jewelry or makeup Put on clean/comfortable clothes.  Please brush your teeth.  Ask your nurse before applying any prescription medications to the skin.        CHG Compatible Lotions    Aveeno Moisturizing lotion  Cetaphil Moisturizing Cream  Cetaphil Moisturizing Lotion  Clairol Herbal Essence Moisturizing Lotion, Dry Skin  Clairol Herbal Essence Moisturizing Lotion, Extra Dry Skin  Clairol Herbal Essence Moisturizing Lotion, Normal Skin  Curel Age Defying Therapeutic Moisturizing Lotion with Alpha Hydroxy  Curel Extreme Care Body Lotion  Curel Soothing Hands Moisturizing Hand Lotion  Curel Therapeutic Moisturizing Cream, Fragrance-Free  Curel Therapeutic Moisturizing Lotion, Fragrance-Free  Curel Therapeutic Moisturizing Lotion, Original Formula  Eucerin Daily Replenishing Lotion  Eucerin Dry Skin Therapy Plus Alpha Hydroxy Crme  Eucerin Dry Skin Therapy Plus Alpha Hydroxy Lotion  Eucerin Original Crme  Eucerin Original Lotion  Eucerin Plus Crme Eucerin Plus Lotion  Eucerin TriLipid Replenishing Lotion  Keri Anti-Bacterial Hand Lotion  Keri Deep Conditioning Original Lotion Dry Skin Formula Softly Scented  Keri Deep Conditioning Original Lotion, Fragrance Free Sensitive Skin Formula  Keri Lotion Fast Absorbing Fragrance Free Sensitive Skin Formula  Keri Lotion Fast Absorbing Softly Scented Dry Skin Formula  Keri Original Lotion  Keri Skin Renewal Lotion Keri Silky Smooth Lotion  Keri Silky Smooth Sensitive Skin Lotion  Nivea Body Creamy Conditioning Oil  Nivea Body Extra Enriched Lotion  Nivea Body Original Lotion  Nivea Body Sheer Moisturizing Lotion Nivea Crme  Nivea Skin Firming Lotion  NutraDerm 30 Skin Lotion  NutraDerm Skin Lotion  NutraDerm Therapeutic Skin Cream  NutraDerm Therapeutic  Skin Lotion  ProShield Protective Hand Cream  Provon moisturizing lotion     [place]- Preparing for Total Shoulder Arthroplasty   Before surgery, you can play an important role. Because  skin is not sterile, your skin needs to be as free of germs as possible. You can reduce the number of germs on your skin by using the following products.    Benzoyl Peroxide Gel   o Reduces the number of germs present on the skin   o Applied twice a day to shoulder area starting two days before surgery    Chlorhexidine  Gluconate (CHG) Soap (instructions listed above on how to wash with CHG Soap)   o An antiseptic cleaner that kills germs and bonds with the skin to continue killing germs even after washing   o Used for showering the night before surgery and morning of surgery     ==================================================================   Please follow these instructions carefully:   BENZOYL PEROXIDE 5% GEL   Please do not use if you have an allergy to benzoyl peroxide. If your skin becomes reddened/irritated stop using the benzoyl peroxide.   Starting two days before surgery, apply as follows:   1. Apply benzoyl peroxide in the morning and at night. Apply after taking a shower. If you are not taking a shower clean entire shoulder front, back, and side along with the armpit with a clean wet washcloth.   2. Place a quarter-sized dollop on your SHOULDER and rub in thoroughly, making sure to cover the front, back, and side of your shoulder, along with the armpit.               2 Days prior to Surgery First Dose on _____________ Morning Second Dose on ______________ Night   Day Before Surgery First Dose on ______________ Morning Night before surgery wash (entire body except face and private areas) with CHG Soap THEN Second Dose on ____________ Night        4. Do NOT apply benzoyl peroxide gel on the day of surgery     Please read over the following fact sheets that you were  given.

## 2024-02-15 NOTE — Progress Notes (Signed)
 Anesthesia Chart Review:  Case: [id number] Date/Time: 02/19/24 1025   Procedure: ARTHROPLASTY, SHOULDER, TOTAL, REVERSE (Left: Shoulder)   Anesthesia type: General   Diagnosis: Rotator cuff arthropathy of left shoulder [M12.812]   Pre-op diagnosis: left rotator cuff arthropathy   [facility] ROOM 04 / [facility]   Surgeons: Addie Cordella Hamilton, MD       DISCUSSION: Patient is a 74-year-old male scheduled for the above procedure.  History includes former smoker (quit 2010), postoperative N/V, HTN, CAD (mild, non-obstructive 2019), DM2, hypothyroidism, glaucoma, OSA, CKD (stage 3), GERD, right mid tibial and trimalleolar fractures (s/p IM nailing tibia, ORIF right ankle 12/08/2001, s/p removal right tibial nail 01/05/2010), osteoarthritis (left TKA 01/18/2005; right TKA 01/05/2010), spinal surgery (C5-7 ACDF 04/05/2016).   He had preoperative cardiology evaluation on 01/18/2024 by Dr. Lavona. He was previously seen by Dr. Victory Sharps and an abnormal stress each followed by a cardiac cath in 2019 showing 30% D1, 30-40% CX. Since then, he has done very well.  At visit he denied chest pain, new shortness of breath.  He was active doing a lot of yard work walking behind [employer] and doing a scientist, physiological.  EKG showed normal sinus rhythm, nonspecific T wave changes.  Continue risk factor modification plan.  In regards to surgery he was felt to be acceptable risk for the planned procedure.  No further cardiovascular testing is suggested.  He has a high functional level.  He has no unstable symptoms or findings.  He is not going for a high risk surgical procedure from a cardiovascular standpoint.  As needed cardiology follow-up planned.  Recent A1c 7.0%.  On metformin  and Farxiga .  At PAT, he was advised to hold Farxiga  3 days prior to surgery.  He received azithromycin , prednisone  01/11/2024 for acute bronchitis after fighting symptoms of chest congestions, productive cough for 1-2 weeks. No fever or hypoxia.  Normal breath sounds per urgent care provider. Although congestion improved, he had routine follow-up had residual occasional, mostly dry, cough. He mentioned this at his routine follow-up with his PCP Geofm Glade PARAS, MD on 02/08/2024 since he had upcoming surgery. She suspected residual from earlier bronchitis and should resolve on its own. No suspicion for current infection and need for antibiotic use. Lungs were clear. At 02/14/2024 PAT visit, he reported he is primarily just having to clear his throat on occasion.  Anesthesia team to evaluate on the day of surgery.   VS: BP (!) 145/79   Pulse 60   Temp 36.7 C   Resp 19   Ht 5' 9 (1.753 m)   Wt 83 kg   SpO2 98%   BMI 27.02 kg/m   PROVIDERS: Geofm Glade PARAS, MD is PCP  Lavona Agent, MD is cardiologist.  As needed cardiology follow-up recommended at 01/18/2024 visit.  LABS: Most recent labs from 02/08/2024 and 03/05/2024 noted. Results include: Lab Results  Component Value Date   WBC 6.1 02/08/2024   HGB 12.9 (L) 02/08/2024   HCT 38.4 (L) 02/08/2024   PLT 297.0 02/08/2024   GLUCOSE 116 (H) 02/08/2024   CHOL 114 02/08/2024   TRIG 82.0 02/08/2024   HDL 46.20 02/08/2024   LDLCALC 51 02/08/2024   ALT 19 02/08/2024   AST 21 02/08/2024   NA 138 02/08/2024   K 4.6 02/08/2024   CL 103 02/08/2024   CREATININE 1.26 02/08/2024   BUN 21 02/08/2024   CO2 27 02/08/2024   TSH 2.76 02/08/2024   HGBA1C 7.0 (H) 02/08/2024  MICROALBUR 37.6 (H) 02/08/2024     IMAGES: CT Left Shoulder 12/12/2023: IMPRESSION: 1. Severe glenohumeral joint osteoarthritis. 2. Large glenohumeral joint effusion with fluid extending into the subacromial/subdeltoid space and high-riding humeral head, likely secondary to rotator cuff tear. 3. Moderate degenerative arthropathy of the acromioclavicular joint. 4. Atrophy of the supraspinatus and infraspinatus muscles. Less pronounced atrophy of the subscapularis muscle.    EKG: 10/22/2023: Normal sinus  rhythm Nonspecific T wave abnormality   CV: US  Carotid 08/20/2023: Summary:  - Right Carotid: The extracranial vessels were near-normal with only minimal wall thickening or plaque.  - Left Carotid: Velocities in the left ICA are consistent with a 1-39% stenosis. The ECA appears >50% stenosed.  - Vertebrals:  Bilateral vertebral arteries demonstrate antegrade flow.  - Subclavians: Normal flow hemodynamics were seen in bilateral subclavian arteries.   Cardiac cath 02/06/2017: Widely patent coronary arteries with codominant anatomy. Normal left main. Normal LAD.  First diagonal contains proximal to mid 30% narrowing. Codominant circumflex.  Mid 30-40% narrowing after the first obtuse marginal. Codominant RCA normal right coronary artery Normal left ventricular function and hemodynamics False positive electrocardiographic response to exercise.  Explanation for exertional chest discomfort is uncertain but could include coronary micro- vascular disease (syndrome X).   RECOMMENDATIONS:  Secondary risk modification including management of LDL cholesterol, diabetes, and blood pressure. Consider use of beta-blocker therapy as part of antihypertensive regimen. No further ischemic evaluation necessary.  No limitations other than as is reasonable based upon symptoms.   Past Medical History:  Diagnosis Date   Allergy    Arthritis    Chronic kidney disease    stage 3   Diabetes mellitus without complication (HCC)    Diverticulitis of colon    GERD (gastroesophageal reflux disease)    Glaucoma    Gout    Headache    Hypertension    Hypothyroidism    PONV (postoperative nausea and vomiting)    Sleep apnea    does not wear CPAP   Thyroid  disease    URI (upper respiratory infection) 11/26/2017    Past Surgical History:  Procedure Laterality Date   ANKLE FRACTURE SURGERY     ANTERIOR CERVICAL DECOMP/DISCECTOMY FUSION N/A 04/05/2016   Procedure: C5-6, C6-7 Anterior Cervical Discectomy  and Fusion, Allograft, Plate;  Surgeon: Oneil JAYSON Herald, MD;  Location: [facility];  Service: Orthopedics;  Laterality: N/A;   ANTERIOR FUSION CERVICAL SPINE  04/05/2016   c6 c7   CARPAL TUNNEL RELEASE Bilateral    CATARACT EXTRACTION Bilateral    2025   COLONOSCOPY     JOINT REPLACEMENT  01/2005   left knee replaced    JOINT REPLACEMENT  11/2008   right knee replaced    KNEE SURGERY     LEFT HEART CATH AND CORONARY ANGIOGRAPHY N/A 02/06/2017   Procedure: LEFT HEART CATH AND CORONARY ANGIOGRAPHY;  Surgeon: Claudene Victory ORN, MD;  Location: [facility];  Service: Cardiovascular;  Laterality: N/A;   LEG SURGERY  11/2001   broken lower right leg crushed    NASAL SINUS SURGERY  01/2023    MEDICATIONS:  amLODipine  (NORVASC ) 5 MG tablet   amLODipine -valsartan  (EXFORGE ) 5-320 MG tablet   Ascorbic Acid (VITAMIN C WITH ROSE HIPS) 500 MG tablet   atorvastatin  (LIPITOR) 10 MG tablet   Azelastine  HCl 0.15 % SOLN   B Complex-C (SUPER B COMPLEX PO)   Calcium  Carbonate-Vitamin D (CALCIUM -VITAMIN D3 PO)   Cholecalciferol (VITAMIN D3) 1000 units CAPS   colchicine  0.6  MG tablet   dapagliflozin  propanediol (FARXIGA ) 10 MG TABS tablet   Garlic 1000 MG CAPS   glucosamine-chondroitin 500-400 MG tablet   levocetirizine (XYZAL) 5 MG tablet   levothyroxine  (SYNTHROID ) 112 MCG tablet   Magnesium 100 MG TABS   metFORMIN  (GLUCOPHAGE -XR) 500 MG 24 hr tablet   Multiple Vitamins-Minerals (MULTIVITAMIN ADULTS 50+ PO)   pantoprazole  (PROTONIX ) 40 MG tablet   vitamin E 400 UNIT capsule   Zinc 50 MG TABS   No current facility-administered medications for this encounter.    Isaiah Ruder, PA-C Surgical Short Stay/Anesthesiology [facility] Phone [phone] [facility] Phone [phone] 02/15/2024 1:00 PM

## 2024-02-15 NOTE — Anesthesia Preprocedure Evaluation (Signed)
"                                    Anesthesia Evaluation    Airway        Dental   Pulmonary former smoker          Cardiovascular hypertension,      Neuro/Psych    GI/Hepatic   Endo/Other  diabetes    Renal/GU      Musculoskeletal   Abdominal   Peds  Hematology   Anesthesia Other Findings   Reproductive/Obstetrics                              Anesthesia Physical Anesthesia Plan  ASA:   Anesthesia Plan:    Post-op Pain Management:    Induction:   PONV Risk Score and Plan:   Airway Management Planned:   Additional Equipment:   Intra-op Plan:   Post-operative Plan:   Informed Consent:   Plan Discussed with:   Anesthesia Plan Comments: (PAT note written 02/15/2024 by Aiman Sonn, PA-C.  )        Anesthesia Quick Evaluation  "

## 2024-02-19 ENCOUNTER — Ambulatory Visit (HOSPITAL_COMMUNITY): Admission: RE | Admit: 2024-02-19 | Source: Home / Self Care | Admitting: Orthopedic Surgery

## 2024-02-19 ENCOUNTER — Encounter (HOSPITAL_COMMUNITY): Admission: RE | Payer: Self-pay | Source: Home / Self Care

## 2024-02-19 ENCOUNTER — Encounter (HOSPITAL_COMMUNITY): Payer: Self-pay | Admitting: Vascular Surgery

## 2024-02-19 DIAGNOSIS — Z01818 Encounter for other preprocedural examination: Secondary | ICD-10-CM

## 2024-03-05 ENCOUNTER — Encounter: Admitting: Orthopedic Surgery

## 2024-08-18 ENCOUNTER — Ambulatory Visit

## 2024-09-08 ENCOUNTER — Ambulatory Visit: Admitting: Internal Medicine
